# Patient Record
Sex: Female | Born: 1975 | Race: Black or African American | Hispanic: No | Marital: Married | Smoking: Never smoker
Health system: Southern US, Community
[De-identification: ages and names within clinical notes are randomized; demographics above are authoritative.]

## PROBLEM LIST (undated history)

## (undated) ENCOUNTER — Inpatient Hospital Stay (HOSPITAL_COMMUNITY): Payer: Self-pay

## (undated) DIAGNOSIS — R519 Headache, unspecified: Secondary | ICD-10-CM

## (undated) DIAGNOSIS — G43909 Migraine, unspecified, not intractable, without status migrainosus: Secondary | ICD-10-CM

## (undated) DIAGNOSIS — R011 Cardiac murmur, unspecified: Secondary | ICD-10-CM

## (undated) DIAGNOSIS — L509 Urticaria, unspecified: Secondary | ICD-10-CM

## (undated) DIAGNOSIS — S060XAA Concussion with loss of consciousness status unknown, initial encounter: Secondary | ICD-10-CM

## (undated) DIAGNOSIS — K219 Gastro-esophageal reflux disease without esophagitis: Secondary | ICD-10-CM

## (undated) DIAGNOSIS — I10 Essential (primary) hypertension: Secondary | ICD-10-CM

## (undated) DIAGNOSIS — N3010 Interstitial cystitis (chronic) without hematuria: Secondary | ICD-10-CM

## (undated) DIAGNOSIS — Z973 Presence of spectacles and contact lenses: Secondary | ICD-10-CM

## (undated) DIAGNOSIS — B009 Herpesviral infection, unspecified: Secondary | ICD-10-CM

## (undated) DIAGNOSIS — T7840XA Allergy, unspecified, initial encounter: Secondary | ICD-10-CM

## (undated) DIAGNOSIS — K589 Irritable bowel syndrome without diarrhea: Secondary | ICD-10-CM

## (undated) DIAGNOSIS — T8859XA Other complications of anesthesia, initial encounter: Secondary | ICD-10-CM

## (undated) DIAGNOSIS — T783XXA Angioneurotic edema, initial encounter: Secondary | ICD-10-CM

## (undated) DIAGNOSIS — S060X9A Concussion with loss of consciousness of unspecified duration, initial encounter: Secondary | ICD-10-CM

## (undated) DIAGNOSIS — E785 Hyperlipidemia, unspecified: Secondary | ICD-10-CM

## (undated) DIAGNOSIS — G8929 Other chronic pain: Secondary | ICD-10-CM

## (undated) DIAGNOSIS — O139 Gestational [pregnancy-induced] hypertension without significant proteinuria, unspecified trimester: Secondary | ICD-10-CM

## (undated) DIAGNOSIS — Z8489 Family history of other specified conditions: Secondary | ICD-10-CM

## (undated) DIAGNOSIS — K635 Polyp of colon: Secondary | ICD-10-CM

## (undated) DIAGNOSIS — D649 Anemia, unspecified: Secondary | ICD-10-CM

## (undated) HISTORY — DX: Polyp of colon: K63.5

## (undated) HISTORY — DX: Gestational (pregnancy-induced) hypertension without significant proteinuria, unspecified trimester: O13.9

## (undated) HISTORY — PX: ADENOIDECTOMY: SUR15

## (undated) HISTORY — DX: Herpesviral infection, unspecified: B00.9

## (undated) HISTORY — DX: Allergy, unspecified, initial encounter: T78.40XA

## (undated) HISTORY — PX: KNEE ARTHROSCOPY: SHX127

## (undated) HISTORY — DX: Headache, unspecified: R51.9

## (undated) HISTORY — DX: Essential (primary) hypertension: I10

## (undated) HISTORY — DX: Hyperlipidemia, unspecified: E78.5

## (undated) HISTORY — DX: Cardiac murmur, unspecified: R01.1

## (undated) HISTORY — DX: Interstitial cystitis (chronic) without hematuria: N30.10

## (undated) HISTORY — PX: TONSILLECTOMY: SUR1361

## (undated) HISTORY — DX: Angioneurotic edema, initial encounter: T78.3XXA

## (undated) HISTORY — DX: Urticaria, unspecified: L50.9

## (undated) HISTORY — DX: Gilbert syndrome: E80.4

## (undated) HISTORY — PX: SHOULDER ARTHROSCOPY: SHX128

## (undated) HISTORY — DX: Other chronic pain: G89.29

## (undated) HISTORY — PX: WISDOM TOOTH EXTRACTION: SHX21

---

## 2000-04-07 ENCOUNTER — Other Ambulatory Visit: Admission: RE | Admit: 2000-04-07 | Discharge: 2000-04-07 | Payer: Self-pay | Admitting: Gynecology

## 2000-05-28 HISTORY — PX: BREAST SURGERY: SHX581

## 2000-06-10 ENCOUNTER — Encounter (INDEPENDENT_AMBULATORY_CARE_PROVIDER_SITE_OTHER): Payer: Self-pay | Admitting: *Deleted

## 2000-06-10 ENCOUNTER — Ambulatory Visit (HOSPITAL_BASED_OUTPATIENT_CLINIC_OR_DEPARTMENT_OTHER): Admission: RE | Admit: 2000-06-10 | Discharge: 2000-06-10 | Payer: Self-pay | Admitting: Surgery

## 2001-06-09 ENCOUNTER — Other Ambulatory Visit: Admission: RE | Admit: 2001-06-09 | Discharge: 2001-06-09 | Payer: Self-pay | Admitting: Obstetrics and Gynecology

## 2001-06-24 ENCOUNTER — Encounter: Admission: RE | Admit: 2001-06-24 | Discharge: 2001-06-24 | Payer: Self-pay | Admitting: Internal Medicine

## 2001-06-24 ENCOUNTER — Encounter: Payer: Self-pay | Admitting: Internal Medicine

## 2002-02-02 ENCOUNTER — Emergency Department (HOSPITAL_COMMUNITY): Admission: EM | Admit: 2002-02-02 | Discharge: 2002-02-02 | Payer: Self-pay | Admitting: *Deleted

## 2002-08-03 ENCOUNTER — Other Ambulatory Visit: Admission: RE | Admit: 2002-08-03 | Discharge: 2002-08-03 | Payer: Self-pay | Admitting: Obstetrics and Gynecology

## 2004-01-24 ENCOUNTER — Other Ambulatory Visit: Admission: RE | Admit: 2004-01-24 | Discharge: 2004-01-24 | Payer: Self-pay | Admitting: Obstetrics and Gynecology

## 2005-03-16 ENCOUNTER — Other Ambulatory Visit: Admission: RE | Admit: 2005-03-16 | Discharge: 2005-03-16 | Payer: Self-pay | Admitting: Obstetrics and Gynecology

## 2005-05-15 ENCOUNTER — Other Ambulatory Visit: Admission: RE | Admit: 2005-05-15 | Discharge: 2005-05-15 | Payer: Self-pay | Admitting: Obstetrics and Gynecology

## 2006-05-28 HISTORY — PX: WISDOM TOOTH EXTRACTION: SHX21

## 2006-06-04 ENCOUNTER — Other Ambulatory Visit: Admission: RE | Admit: 2006-06-04 | Discharge: 2006-06-04 | Payer: Self-pay | Admitting: Obstetrics & Gynecology

## 2007-04-17 ENCOUNTER — Ambulatory Visit (HOSPITAL_BASED_OUTPATIENT_CLINIC_OR_DEPARTMENT_OTHER): Admission: RE | Admit: 2007-04-17 | Discharge: 2007-04-17 | Payer: Self-pay | Admitting: Urology

## 2007-05-28 ENCOUNTER — Other Ambulatory Visit: Admission: RE | Admit: 2007-05-28 | Discharge: 2007-05-28 | Payer: Self-pay | Admitting: Obstetrics and Gynecology

## 2007-10-13 ENCOUNTER — Other Ambulatory Visit: Admission: RE | Admit: 2007-10-13 | Discharge: 2007-10-13 | Payer: Self-pay | Admitting: Obstetrics and Gynecology

## 2008-05-26 ENCOUNTER — Other Ambulatory Visit: Admission: RE | Admit: 2008-05-26 | Discharge: 2008-05-26 | Payer: Self-pay | Admitting: Obstetrics and Gynecology

## 2008-05-28 HISTORY — PX: SHOULDER ARTHROSCOPY: SHX128

## 2009-04-13 ENCOUNTER — Emergency Department (HOSPITAL_COMMUNITY): Admission: EM | Admit: 2009-04-13 | Discharge: 2009-04-13 | Payer: Self-pay | Admitting: Emergency Medicine

## 2009-04-18 ENCOUNTER — Emergency Department (HOSPITAL_COMMUNITY): Admission: EM | Admit: 2009-04-18 | Discharge: 2009-04-18 | Payer: Self-pay | Admitting: Emergency Medicine

## 2010-10-10 NOTE — Op Note (Signed)
NAME:  Susi, [facility]                ACCOUNT NO.:  [id number]   MEDICAL RECORD NO.:  [id number]          PATIENT TYPE:  AMB   LOCATION:  [facility]                         FACILITY:  [facility]   PHYSICIAN:  Martina Sinner, MD DATE OF BIRTH:  10-09-1975   DATE OF PROCEDURE:  04/17/2007  DATE OF DISCHARGE:                               OPERATIVE REPORT   PREOPERATIVE DIAGNOSIS:  Pelvic pain.   POSTOPERATIVE DIAGNOSIS:  Pelvic pain, interstitial cystitis, mild  meatal stenosis.   PROCEDURE:  Cystoscopy, urethral dilation, bladder hydrodistention,  bladder installation therapy.   Shyann Hefner has left lower quadrant and left flank pain.  By index, the  suspicion was moderate to low that she had interstitial cystitis.   The patient was prepped and draped in the usual fashion.  She was given  preoperative antibiotics.  Even with the obturator, I could not insert a  21 [place] scope because of mild meatal stenosis.  She was, therefore,  dilated to 78 [place].  I then could cystoscope the patient.  Initial  examination demonstrated normal bladder mucosa with no stitch, foreign  body, or carcinoma.  I hydrodistended twice to 550 mL.  She had moderate  glomerulations along the sidewalls and near the trigone.  She had a  little bit of bladder bleeding.  There were no ulcers.  From the  dilation, she had a little bit of bleeding near the urethral meatus that  was more of a nuisance.  I held direct pressure.  For this reason, I put  in an 47 French catheter and a rolled up back with Estrace cream to give  direct pressure.  This should stop quickly postoperatively.   My working diagnosis is interstitial cystitis.  I will have the patient  come back next week for education and treatment.           ______________________________  Martina Sinner, MD  Electronically Signed     SAM/MEDQ  D:  04/17/2007  T:  04/17/2007  Job:  [id number]

## 2010-10-13 NOTE — Op Note (Signed)
[facility]  Patient:    MONCERRAT, BURNSTEIN                         MRN: [id number] Proc. Date: 06/10/00 Adm. Date:  [id number] Disc. Date: [id number] Attending:  Tonye Royalty                           Operative Report  PREOPERATIVE INDICATIONS:  A 35 year old young lady with a palpable mass in her right breast.  POSTOPERATIVE DIAGNOSIS:  Probable fibroadenoma.  PROCEDURE:  Right breast biopsy.  SURGEON:  Thornton Park. Daphine Deutscher, M.D.  ANESTHESIA:  MAC.  DESCRIPTION OF PROCEDURE:  Ms. Lynita Lombard was taken to OR 2 on June 10, 2000 and the breast was prepped Betadine and draped sterilely.  She was given some sedation.  Area in question was infiltrated with 1% lidocaine and a small curvilinear incision was made from about the 3:30 position to the 5 oclock position.  This was carried down into some fairly dense breast tissue and sample was removed and then going deeper, the mass could be palpated and was then grasped and dissected free from surrounding tissue and removed.  It was about a grape-sized mass and was firm.  This was sent for permanent sections. Wound was irrigated and bleeding was controlled with the electrocautery.  I had put some little marking stitches where I had made a radial mark to appose the edges and with that as a guide, I placed some 4-0 Vicryl subcutaneously and subcuticularly and then tied those down and then closed the rest of the wound with benzoin and Steri-Strips.  The patient tolerated this procedure well and was taken to the recovery room in satisfactory condition. DD:  06/10/00 TD:  06/10/00 Job: [id number]

## 2010-10-25 ENCOUNTER — Ambulatory Visit (HOSPITAL_BASED_OUTPATIENT_CLINIC_OR_DEPARTMENT_OTHER): Admission: RE | Admit: 2010-10-25 | Payer: 59 | Source: Ambulatory Visit | Admitting: Orthopedic Surgery

## 2010-12-15 ENCOUNTER — Other Ambulatory Visit: Payer: Self-pay | Admitting: Surgery

## 2011-03-06 LAB — POCT HEMOGLOBIN-HEMACUE: Operator id: 28088

## 2011-03-06 LAB — POCT PREGNANCY, URINE
Operator id: 280881
Preg Test, Ur: NEGATIVE

## 2011-03-29 ENCOUNTER — Inpatient Hospital Stay (INDEPENDENT_AMBULATORY_CARE_PROVIDER_SITE_OTHER)
Admission: RE | Admit: 2011-03-29 | Discharge: 2011-03-29 | Disposition: A | Discharge status: Home / Self Care | Payer: 59 | Source: Ambulatory Visit | Attending: Family Medicine | Admitting: Family Medicine

## 2011-03-29 DIAGNOSIS — J069 Acute upper respiratory infection, unspecified: Secondary | ICD-10-CM

## 2012-01-13 ENCOUNTER — Emergency Department (INDEPENDENT_AMBULATORY_CARE_PROVIDER_SITE_OTHER)
Admission: EM | Admit: 2012-01-13 | Discharge: 2012-01-13 | Disposition: A | Discharge status: Home / Self Care | Payer: 59 | Source: Home / Self Care | Attending: Emergency Medicine | Admitting: Emergency Medicine

## 2012-01-13 DIAGNOSIS — H109 Unspecified conjunctivitis: Secondary | ICD-10-CM

## 2012-01-13 MED ORDER — TETRACAINE HCL 0.5 % OP SOLN
OPHTHALMIC | Status: AC
  Filled 2012-01-13: qty 2

## 2012-01-13 MED ORDER — POLYETHYL GLYCOL-PROPYL GLYCOL 0.4-0.3 % OP SOLN: 1.0000 [drp] | OPHTHALMIC | Status: DC

## 2012-01-13 NOTE — ED Provider Notes (Signed)
No chief complaint on file.   History of Present Illness:   Denise [name] is a 36 year old female with a two-day history of redness, itching, and crusting of her right eye. Her vision has been normal. She has had no swelling of the lids. She denies any drainage from the eye. There is no injury to the eye. She does wear contact lenses, but has taken them out and is now wearing her spectacles. She denies any URI symptoms such as sore throat, nasal congestion, rhinorrhea, fever, cough, or adenopathy. She has not been exposed to anyone with eye infections.  Review of Systems:  Other than noted above, the patient denies any of the following symptoms: Systemic:  No fever, chills, sweats, fatigue, or weight loss. Eye:  No redness, eye pain, photophobia, discharge, blurred vision, or diplopia. ENT:  No nasal congestion, rhinorrhea, or sore throat. Lymphatic:  No adenopathy. Skin:  No rash or pruritis.  PMFSH:  Past medical history, family history, social history, meds, and allergies were reviewed.  Physical Exam:   Vital signs:  BP 128/73  Pulse 80  Temp 98.5 F (36.9 C) (Oral)  Resp 18  SpO2 98% General:  Alert and in no distress. Eye:  Lids and periorbital tissues are normal. Conjunctiva of the right is mildly injected, left eye appears normal. There is no foreign body is seen in the conjunctival sac. The upper lid was everted and appears to be normal. The cornea is intact to fluorescein staining. Anterior chambers normal. Funduscopic exam is normal. PERRLA, full EOMs. ENT:  TMs and canals clear.  Nasal mucosa normal.  No intra-oral lesions, mucous membranes moist, pharynx clear. Neck:  No adenopathy tenderness or mass. Skin:  Clear, warm and dry.  Assessment:  The encounter diagnosis was Conjunctivitis. Probably viral conjunctivitis.  Plan:   1.  The following meds were prescribed:   New Prescriptions   POLYETHYL GLYCOL-PROPYL GLYCOL (SYSTANE) 0.4-0.3 % SOLN    Apply 1 drop to eye every 3 (three)  hours while awake.   2.  The patient was instructed in symptomatic care and handouts were given. 3.  The patient was told to return if becoming worse in any way, if no better in 3 or 4 days, and given some red flag symptoms that would indicate earlier return.   Reuben Likes, MD 01/13/12 [phone]

## 2012-01-26 DIAGNOSIS — R109 Unspecified abdominal pain: Secondary | ICD-10-CM | POA: Insufficient documentation

## 2012-01-26 DIAGNOSIS — Z79899 Other long term (current) drug therapy: Secondary | ICD-10-CM | POA: Insufficient documentation

## 2012-01-27 ENCOUNTER — Encounter (HOSPITAL_COMMUNITY): Payer: Self-pay | Admitting: *Deleted

## 2012-01-27 ENCOUNTER — Emergency Department (HOSPITAL_COMMUNITY): Payer: 59

## 2012-01-27 ENCOUNTER — Emergency Department (HOSPITAL_COMMUNITY)
Admission: EM | Admit: 2012-01-27 | Discharge: 2012-01-27 | Disposition: A | Discharge status: Home / Self Care | Payer: 59 | Attending: Emergency Medicine | Admitting: Emergency Medicine

## 2012-01-27 DIAGNOSIS — R109 Unspecified abdominal pain: Secondary | ICD-10-CM

## 2012-01-27 HISTORY — DX: Irritable bowel syndrome, unspecified: K58.9

## 2012-01-27 HISTORY — DX: Migraine, unspecified, not intractable, without status migrainosus: G43.909

## 2012-01-27 LAB — URINALYSIS, ROUTINE W REFLEX MICROSCOPIC
Bilirubin Urine: NEGATIVE
Glucose, UA: NEGATIVE mg/dL
Hgb urine dipstick: NEGATIVE
Specific Gravity, Urine: 1.012 (ref 1.005–1.030)
Urobilinogen, UA: 1 mg/dL (ref 0.0–1.0)

## 2012-01-27 LAB — CBC WITH DIFFERENTIAL/PLATELET
Eosinophils Absolute: 0.1 10*3/uL (ref 0.0–0.7)
Eosinophils Relative: 1 % (ref 0–5)
HCT: 39 % (ref 36.0–46.0)
Lymphocytes Relative: 48 % — ABNORMAL HIGH (ref 12–46)
Lymphs Abs: 3.1 10*3/uL (ref 0.7–4.0)
MCH: 29.9 pg (ref 26.0–34.0)
MCV: 82.6 fL (ref 78.0–100.0)
Monocytes Absolute: 0.6 10*3/uL (ref 0.1–1.0)
Monocytes Relative: 10 % (ref 3–12)
Platelets: 231 10*3/uL (ref 150–400)
RBC: 4.72 MIL/uL (ref 3.87–5.11)
WBC: 6.4 10*3/uL (ref 4.0–10.5)

## 2012-01-27 LAB — COMPREHENSIVE METABOLIC PANEL
ALT: 16 U/L (ref 0–35)
BUN: 13 mg/dL (ref 6–23)
CO2: 22 mEq/L (ref 19–32)
Calcium: 9.6 mg/dL (ref 8.4–10.5)
Creatinine, Ser: 0.97 mg/dL (ref 0.50–1.10)
GFR calc Af Amer: 87 mL/min — ABNORMAL LOW (ref >90)
GFR calc non Af Amer: 75 mL/min — ABNORMAL LOW (ref >90)
Glucose, Bld: 100 mg/dL — ABNORMAL HIGH (ref 70–99)
Total Protein: 7.3 g/dL (ref 6.0–8.3)

## 2012-01-27 LAB — POCT PREGNANCY, URINE: Preg Test, Ur: NEGATIVE

## 2012-01-27 MED ORDER — IOHEXOL 300 MG/ML  SOLN
100.0000 mL | Freq: Once | INTRAMUSCULAR | Status: AC | PRN
  Administered 2012-01-27: 100 mL via INTRAVENOUS

## 2012-01-27 MED ORDER — OXYCODONE-ACETAMINOPHEN 5-325 MG PO TABS
2.0000 | ORAL_TABLET | Freq: Once | ORAL | Status: AC
  Administered 2012-01-27: 2 via ORAL
  Filled 2012-01-27: qty 2

## 2012-01-27 MED ORDER — TRAMADOL HCL 50 MG PO TABS: 50.0000 mg | ORAL_TABLET | Freq: Four times a day (QID) | ORAL | Status: AC | PRN

## 2012-01-27 NOTE — ED Notes (Signed)
Pt has IBS and was seen at gasterologist and GYN over the last month when the pain started. Pt states that the pain is only on her left side and the pain wraps around to her back. Pt was given increased strength of pain medication and for 4 weeks has been taking it but it has been helping. Pt denies problems with urine

## 2012-01-27 NOTE — ED Notes (Signed)
EDP at bedside  

## 2012-01-27 NOTE — ED Notes (Signed)
Pt complaining of right flank pain. Pt states that she is still having pain. Pt resting at this time.

## 2012-01-27 NOTE — ED Provider Notes (Signed)
History     CSN: [id number]  Arrival date & time 01/26/12  2358   First MD Initiated Contact with Patient 01/27/12 [phone]      Chief Complaint  Patient presents with  . Flank Pain    (Consider location/radiation/quality/duration/timing/severity/associated sxs/prior treatment) HPI  This patient is a very pleasant 36 year old woman with a history of irritable bowel syndrome. She presents with complaints of intermittent left flank pain which has been ongoing for the past 3-4 weeks. She has been evaluated by both her gynecologist and her gastroenterologist for this pain. Her gastroenterologist increased the dose of medication which she has been taking for treatment of her IBS. However, the patient says she has been on this increased dose for the past 2 weeks and has not noticed improvement. Her pain has not really progressed. She is mainly concerned and presents to the emergency department because her pain has not improved.  The patient denies history of similar symptoms. She has not had any genitourinary symptoms. She has not had fever, nausea, vomiting. She has intermittent diarrhea which is chronic. She denies bloody stools. Her pain is worse with certain movements of the torso. It is nonradiating. The pain initially was noticed in the left lower quadrant a couple of weeks ago. However, it now localizes to the left flank region.    Past Medical History  Diagnosis Date  . IBS (irritable bowel syndrome)   . Herpes   . Migraine     Past Surgical History  Procedure Date  . Shoulder arthroscopy     History reviewed. No pertinent family history.  History  Substance Use Topics  . Smoking status: Never Smoker   . Smokeless tobacco: Not on file  . Alcohol Use: Yes    OB History    Grav Para Term Preterm Abortions TAB SAB Ect Mult Living                  Review of Systems  Gen: no weight loss, fevers, chills, night sweats Eyes: no discharge or drainage, no occular pain or  visual changes Nose: no epistaxis or rhinorrhea Mouth: no dental pain, no sore throat Neck: no neck pain Lungs: no SOB, cough, wheezing CV: no chest pain, palpitations, dependent edema or orthopnea Abd: As per history of present illness, otherwise negative GU: no dysuria or gross hematuria MSK: As per history of present illness, otherwise negative Neuro: no headache, no focal neurologic deficits Skin: no rash Psyche: negative.  Allergies  Review of patient's allergies indicates no known allergies.  Home Medications   Current Outpatient Rx  Name Route Sig Dispense Refill  . CAMBIA PO Oral Take 50 mg by mouth daily as needed. For migraine    . LINACLOTIDE 290 MCG PO CAPS Oral Take 1 capsule by mouth daily.    . ADULT MULTIVITAMIN W/MINERALS CH Oral Take 1 tablet by mouth daily.    Janetta Hora ESTRADIOL 0.5-35 MG-MCG PO TABS Oral Take 1 tablet by mouth daily.    . TOPIRAMATE 200 MG PO TABS Oral Take 200 mg by mouth at bedtime.    Marland Kitchen VALACYCLOVIR HCL 1 G PO TABS Oral Take 1,000 mg by mouth daily.      BP 128/90  Pulse 72  Temp 98.2 F (36.8 C) (Oral)  Resp 18  SpO2 100%  Physical Exam  Gen: well developed and well nourished appearing Head: NCAT Eyes: PERL, EOMI Nose: no epistaixis or rhinorrhea Mouth/throat: mucosa is moist and pink Neck: supple, no stridor Lungs:  CTA B, no wheezing, rhonchi or rales Abd: soft, notender, nondistended Back: no midline ttp, no cva ttp, ttp over the left flank region with reproduction/excacerbation of pain. Skin: no rashese, wnl Neuro: CN ii-xii grossly intact, no focal deficits Psyche; normal affect,  calm and cooperative.   ED Course  Procedures (including critical care time)  Results for orders placed during the hospital encounter of 01/27/12 (from the past 24 hour(s))  CBC WITH DIFFERENTIAL     Status: Abnormal   Collection Time   01/27/12 12:07 AM      Component Value Range   WBC 6.4  4.0 - 10.5 K/uL   RBC 4.72  3.87 -  5.11 MIL/uL   Hemoglobin 14.1  12.0 - 15.0 g/dL   HCT 16.1  09.6 - 04.5 %   MCV 82.6  78.0 - 100.0 fL   MCH 29.9  26.0 - 34.0 pg   MCHC 36.2 (*) 30.0 - 36.0 g/dL   RDW 40.9  81.1 - 91.4 %   Platelets 231  150 - 400 K/uL   Neutrophils Relative 40 (*) 43 - 77 %   Neutro Abs 2.6  1.7 - 7.7 K/uL   Lymphocytes Relative 48 (*) 12 - 46 %   Lymphs Abs 3.1  0.7 - 4.0 K/uL   Monocytes Relative 10  3 - 12 %   Monocytes Absolute 0.6  0.1 - 1.0 K/uL   Eosinophils Relative 1  0 - 5 %   Eosinophils Absolute 0.1  0.0 - 0.7 K/uL   Basophils Relative 0  0 - 1 %   Basophils Absolute 0.0  0.0 - 0.1 K/uL  COMPREHENSIVE METABOLIC PANEL     Status: Abnormal   Collection Time   01/27/12 12:07 AM      Component Value Range   Sodium 139  135 - 145 mEq/L   Potassium 3.7  3.5 - 5.1 mEq/L   Chloride 106  96 - 112 mEq/L   CO2 22  19 - 32 mEq/L   Glucose, Bld 100 (*) 70 - 99 mg/dL   BUN 13  6 - 23 mg/dL   Creatinine, Ser 7.82  0.50 - 1.10 mg/dL   Calcium 9.6  8.4 - 95.6 mg/dL   Total Protein 7.3  6.0 - 8.3 g/dL   Albumin 3.9  3.5 - 5.2 g/dL   AST 22  0 - 37 U/L   ALT 16  0 - 35 U/L   Alkaline Phosphatase 62  39 - 117 U/L   Total Bilirubin 0.7  0.3 - 1.2 mg/dL   GFR calc non Af Amer 75 (*) >90 mL/min   GFR calc Af Amer 87 (*) >90 mL/min  URINALYSIS, ROUTINE W REFLEX MICROSCOPIC     Status: Abnormal   Collection Time   01/27/12 12:12 AM      Component Value Range   Color, Urine YELLOW  YELLOW   APPearance CLOUDY (*) CLEAR   Specific Gravity, Urine 1.012  1.005 - 1.030   pH 7.0  5.0 - 8.0   Glucose, UA NEGATIVE  NEGATIVE mg/dL   Hgb urine dipstick NEGATIVE  NEGATIVE   Bilirubin Urine NEGATIVE  NEGATIVE   Ketones, ur NEGATIVE  NEGATIVE mg/dL   Protein, ur NEGATIVE  NEGATIVE mg/dL   Urobilinogen, UA 1.0  0.0 - 1.0 mg/dL   Nitrite NEGATIVE  NEGATIVE   Leukocytes, UA NEGATIVE  NEGATIVE  POCT PREGNANCY, URINE     Status: Normal   Collection Time   01/27/12  12:56 AM      Component Value Range   Preg  Test, Ur NEGATIVE  NEGATIVE    CT abd/pelvis: no acute intra-abdominal pathology identified by Radiologist per wet read of IV contrasted CT.     MDM  ED work up is non-diagnostic. The patient seems to most likely have myofascial pain in light of reproducibility and ttp over the left low back very laterally.  Patient had pelvic exam and pelvic labs by her GYN for eval of the same sx just a couple of weeks ago. She is in a monogomous relationship and I do not believe another pelvic exam is indicated. The patient is stable for d/c with plan for symptomatic management and follow up with her gastroenterologist and also her PCP. She is advised to return to the ED for any urgent health concerns.         Brandt Loosen, MD 01/27/12 [phone]

## 2012-01-27 NOTE — ED Notes (Signed)
Dr. Lavella Lemons was made aware that the patient is requesting for pain medication.

## 2012-08-03 ENCOUNTER — Encounter (HOSPITAL_COMMUNITY): Payer: Self-pay | Admitting: Emergency Medicine

## 2012-08-03 ENCOUNTER — Emergency Department (HOSPITAL_COMMUNITY)
Admission: EM | Admit: 2012-08-03 | Discharge: 2012-08-03 | Disposition: A | Discharge status: Home / Self Care | Payer: 59 | Source: Home / Self Care

## 2012-08-03 DIAGNOSIS — J069 Acute upper respiratory infection, unspecified: Secondary | ICD-10-CM

## 2012-08-03 DIAGNOSIS — R0982 Postnasal drip: Secondary | ICD-10-CM

## 2012-08-03 DIAGNOSIS — J329 Chronic sinusitis, unspecified: Secondary | ICD-10-CM

## 2012-08-03 HISTORY — DX: Gastro-esophageal reflux disease without esophagitis: K21.9

## 2012-08-03 MED ORDER — ALBUTEROL SULFATE HFA 108 (90 BASE) MCG/ACT IN AERS
1.0000 | INHALATION_SPRAY | Freq: Four times a day (QID) | RESPIRATORY_TRACT | Status: DC | PRN
Start: 2012-08-03 — End: 2012-10-14

## 2012-08-03 MED ORDER — PHENYLEPHRINE-CHLORPHEN-DM 10-4-12.5 MG/5ML PO LIQD: 5.0000 mL | ORAL | Status: DC | PRN

## 2012-08-03 NOTE — ED Notes (Signed)
Onset last Sunday of cough, now burning cough, pressure in head and sinus.  No noted fever, felt hot a few times

## 2012-08-03 NOTE — ED Provider Notes (Signed)
History     CSN: [id number]  Arrival date & time 08/03/12  1101   None     Chief Complaint  Patient presents with  . Cough    (Consider location/radiation/quality/duration/timing/severity/associated sxs/prior treatment) HPI Comments: 37 year old female complaining of a cough for 5 days. She is complaining of upper respiratory congestion, head pressure, nasal congestion and PND. She denies fever, chills or respiratory difficulty. No GI or GU symptoms. She is currently taking Mucinex D. and NyQuil date.   Past Medical History  Diagnosis Date  . IBS (irritable bowel syndrome)   . Herpes   . Migraine   . GERD (gastroesophageal reflux disease)     Past Surgical History  Procedure Laterality Date  . Shoulder arthroscopy      No family history on file.  History  Substance Use Topics  . Smoking status: Never Smoker   . Smokeless tobacco: Not on file  . Alcohol Use: Yes    OB History   Grav Para Term Preterm Abortions TAB SAB Ect Mult Living                  Review of Systems  Constitutional: Negative for fever, chills, activity change, appetite change and fatigue.  HENT: Positive for congestion, sore throat, rhinorrhea and postnasal drip. Negative for facial swelling, trouble swallowing, neck pain and neck stiffness.   Eyes: Negative.   Respiratory: Negative.   Cardiovascular: Negative.   Gastrointestinal: Negative.   Musculoskeletal: Negative.   Skin: Negative for pallor and rash.  Neurological: Negative.   Psychiatric/Behavioral: Negative.     Allergies  Review of patient's allergies indicates no known allergies.  Home Medications   Current Outpatient Rx  Name  Route  Sig  Dispense  Refill  . Belladonna Alk-Phenobarbital (DONNATAL PO)   Oral   Take by mouth.         . pantoprazole (PROTONIX) 40 MG tablet   Oral   Take 40 mg by mouth daily.         Marland Kitchen albuterol (PROVENTIL HFA;VENTOLIN HFA) 108 (90 BASE) MCG/ACT inhaler   Inhalation   Inhale 1-2  puffs into the lungs every 6 (six) hours as needed for wheezing.   1 Inhaler   0   . Diclofenac Potassium (CAMBIA PO)   Oral   Take 50 mg by mouth daily as needed. For migraine         . Linaclotide (LINZESS) 290 MCG CAPS   Oral   Take 1 capsule by mouth daily.         . Multiple Vitamin (MULTIVITAMIN WITH MINERALS) TABS   Oral   Take 1 tablet by mouth daily.         . norethindrone-ethinyl estradiol (NECON,BREVICON,MODICON) 0.5-35 MG-MCG tablet   Oral   Take 1 tablet by mouth daily.         Marland Kitchen Phenylephrine-Chlorphen-DM 03-01-11.5 MG/5ML LIQD   Oral   Take 5 mLs by mouth every 4 (four) hours as needed.   120 mL   0   . topiramate (TOPAMAX) 200 MG tablet   Oral   Take 200 mg by mouth at bedtime.         . valACYclovir (VALTREX) 1000 MG tablet   Oral   Take 1,000 mg by mouth daily.           BP 136/86  Pulse 84  Temp(Src) 98.4 F (36.9 C) (Oral)  Resp 19  SpO2 98%  LMP 07/29/2012  Physical Exam  Nursing  note and vitals reviewed. Constitutional: She is oriented to person, place, and time. She appears well-developed and well-nourished. No distress.  HENT:  Bilateral TMs are normal. Oropharynx history daily but without exudates or swelling. Positive for clear PND  Neck: Normal range of motion. Neck supple.  Cardiovascular: Normal rate and regular rhythm.   Pulmonary/Chest: Effort normal and breath sounds normal. No respiratory distress. She has no rales.  Distant, faint and expiratory wheeze particularly with forced cough.  Abdominal: Soft.  Musculoskeletal: Normal range of motion. She exhibits no edema.  Lymphadenopathy:    She has no cervical adenopathy.  Neurological: She is alert and oriented to person, place, and time.  Skin: Skin is warm and dry. No rash noted.  Psychiatric: She has a normal mood and affect.    ED Course  Procedures (including critical care time)  Labs Reviewed - No data to display No results found.   1. URI (upper  respiratory infection)   2. PND (post-nasal drip)   3. Sinusitis       MDM  Norell CS 1 teaspoon every 4 hours when necessary cough and congestion. Cepacol lozenges as needed. Albuterol HFA 2 puffs every 4-6 hours when necessary cough Drink plenty of fluids stay well hydrated         Hayden Rasmussen, NP 08/03/12 1142

## 2012-09-01 ENCOUNTER — Encounter: Payer: Self-pay | Admitting: Nurse Practitioner

## 2012-09-01 ENCOUNTER — Telehealth: Payer: Self-pay | Admitting: Nurse Practitioner

## 2012-09-01 ENCOUNTER — Ambulatory Visit (INDEPENDENT_AMBULATORY_CARE_PROVIDER_SITE_OTHER): Payer: 59 | Admitting: Nurse Practitioner

## 2012-09-01 VITALS — BP 110/76 | HR 80 | Resp 18 | Ht 63.75 in | Wt 153.0 lb

## 2012-09-01 DIAGNOSIS — N390 Urinary tract infection, site not specified: Secondary | ICD-10-CM

## 2012-09-01 DIAGNOSIS — B373 Candidiasis of vulva and vagina: Secondary | ICD-10-CM

## 2012-09-01 DIAGNOSIS — A6000 Herpesviral infection of urogenital system, unspecified: Secondary | ICD-10-CM

## 2012-09-01 LAB — POCT URINALYSIS DIPSTICK
Glucose, UA: NEGATIVE
Nitrite, UA: NEGATIVE
Protein, UA: NEGATIVE

## 2012-09-01 MED ORDER — NITROFURANTOIN MONOHYD MACRO 100 MG PO CAPS: 100.0000 mg | ORAL_CAPSULE | Freq: Two times a day (BID) | ORAL | Status: DC

## 2012-09-01 MED ORDER — FLUCONAZOLE 150 MG PO TABS: 150.0000 mg | ORAL_TABLET | Freq: Once | ORAL | Status: DC

## 2012-09-01 MED ORDER — VALACYCLOVIR HCL 500 MG PO TABS: 500.0000 mg | ORAL_TABLET | Freq: Two times a day (BID) | ORAL | Status: DC

## 2012-09-01 NOTE — Patient Instructions (Addendum)
Monilial Vaginitis Vaginitis in a soreness, swelling and redness (inflammation) of the vagina and vulva. Monilial vaginitis is not a sexually transmitted infection. CAUSES  Yeast vaginitis is caused by yeast (candida) that is normally found in your vagina. With a yeast infection, the candida has overgrown in number to a point that upsets the chemical balance. SYMPTOMS   White, thick vaginal discharge.  Swelling, itching, redness and irritation of the vagina and possibly the lips of the vagina (vulva).  Burning or painful urination.  Painful intercourse. DIAGNOSIS  Things that may contribute to monilial vaginitis are:  Postmenopausal and virginal states.  Pregnancy.  Infections.  Being tired, sick or stressed, especially if you had monilial vaginitis in the past.  Diabetes. Good control will help lower the chance.  Birth control pills.  Tight fitting garments.  Using bubble bath, feminine sprays, douches or deodorant tampons.  Taking certain medications that kill germs (antibiotics).  Sporadic recurrence can occur if you become ill. TREATMENT  Your caregiver will give you medication.  There are several kinds of anti monilial vaginal creams and suppositories specific for monilial vaginitis. For recurrent yeast infections, use a suppository or cream in the vagina 2 times a week, or as directed.  Anti-monilial or steroid cream for the itching or irritation of the vulva may also be used. Get your caregiver's permission.  Painting the vagina with methylene blue solution may help if the monilial cream does not work.  Eating yogurt may help prevent monilial vaginitis. HOME CARE INSTRUCTIONS   Finish all medication as prescribed.  Do not have sex until treatment is completed or after your caregiver tells you it is okay.  Take warm sitz baths.  Do not douche.  Do not use tampons, especially scented ones.  Wear cotton underwear.  Avoid tight pants and panty  hose.  Tell your sexual partner that you have a yeast infection. They should go to their caregiver if they have symptoms such as mild rash or itching.  Your sexual partner should be treated as well if your infection is difficult to eliminate.  Practice safer sex. Use condoms.  Some vaginal medications cause latex condoms to fail. Vaginal medications that harm condoms are:  Cleocin cream.  Butoconazole (Femstat).  Terconazole (Terazol) vaginal suppository.  Miconazole (Monistat) (may be purchased over the counter). SEEK MEDICAL CARE IF:   You have a temperature by mouth above 102 F (38.9 C).  The infection is getting worse after 2 days of treatment.  The infection is not getting better after 3 days of treatment.  You develop blisters in or around your vagina.  You develop vaginal bleeding, and it is not your menstrual period.  You have pain when you urinate.  You develop intestinal problems.  You have pain with sexual intercourse. Document Released: 02/21/2005 Document Revised: 08/06/2011 Document Reviewed: 11/05/2008 Exit[employer] Patient Information 2013 [employer].  

## 2012-09-01 NOTE — Progress Notes (Deleted)
37 y.o.Single{Race/ethnicity:17218} female G0P0 with a {NUMBERS 1-20:19198} {gen duration:315003} history of the following: sympstoms started on Friday.  Worse on Saturday. Vagina itching tenderness, swelling Still had spotting to light flow. {symptoms; vaginitis:30830} Sexually active: {yes no:314532} Last sexual activity:{NUMBERS 1-20:19198}days ago. Pt also reports the following associated symptoms: {Sx; associated vaginitis:30832} Patient {HAS HAS NOT:18834}tried over the counter treatment with {Relief:12621} relief. Still on Topamax at 200 mg. Same dose for 11/2011.Maybe secondary to satrting pill a day late. Some increase in frequency. Ha takien OTC AZo for symptom relief.  No change in partner.     Exam:  Ext:{EXAM; GYN QIONG:29528}                Vag:{Findings; vagina (ob1):14593}                Cx:  {exam; gyn cervix:30847}                Uterus:{exam; uterus:14489}                Adnexa: {exam; adnexa:12223}  Wet Prep shows:{Findings; GYN salin prep:60700}   Dx:{vaginitis type:315262}   Tx:{treatments; vaginitis:14231}

## 2012-09-01 NOTE — Telephone Encounter (Signed)
PT HAS A YEAST INFECTION PT HAS TRIED OVER THE COUNTER MEDS TO TREAT IT BUT NOW WANTS AN APPOINTMENT

## 2012-09-01 NOTE — Telephone Encounter (Signed)
LMTCB  aa 

## 2012-09-01 NOTE — Progress Notes (Signed)
Subjective:     Patient ID: Denise [name], female   DOB: 11/15/1975, 37 y.o.   MRN: [id number]  HPI Comments: Patent states she has noted vaginal irritation for about 2 - 3  Days. Suspected irritation on Saturday, then by Sunday very raw feeling, swollen and vaginal discharge.  She used I day Monistat but symptoms became worse.  She thought she may have a flare of HSV so went up in dose of Valtrex to 1 gm.    Today very itching and raw. Noted some urinary frequency and maybe urgency with some dysuria.   Unsure if this is related to vaginitis.    Review of Systems  Constitutional: Negative for fever.  Respiratory: Negative.   Cardiovascular: Negative.   Gastrointestinal: Negative.   Genitourinary: Positive for dysuria, urgency, frequency, vaginal discharge, genital sores and dyspareunia.  Musculoskeletal: Negative for myalgias.  Psychiatric/Behavioral: Negative.        Objective:   Physical Exam  Constitutional: She appears well-developed and well-nourished.  Cardiovascular: Normal rate.   Pulmonary/Chest: Effort normal.  Abdominal: Soft. She exhibits no distension and no mass. There is no tenderness. There is no rebound and no guarding.  Genitourinary:  Vaginal discharge is white and thick, most likely related to Monistat.  Smear is taken from sidewall. No HSV lesions seen.  Neurological: She is alert.  Skin: Skin is warm and dry.  Psychiatric: She has a normal mood and affect. Her behavior is normal. Thought content normal.      UA  + leukocytes and very cloudy Wet prep + koh for yeast, saline - clue, ph 4.5 Assessment:     Yeast Vaginitis R/O UTI History of HSV    Plan:     Diflucan 150 mg today and repeat in 3 days Also pt. Request a new RX for Valtrex 500 mg so she does not have to cut in half.  Macrobid 100 mg bid # 14 and will call with urine results

## 2012-09-02 LAB — URINALYSIS, MICROSCOPIC ONLY
Casts: NONE SEEN
Crystals: NONE SEEN

## 2012-09-03 LAB — URINE CULTURE: Colony Count: 25000

## 2012-09-09 NOTE — Progress Notes (Signed)
Encounter reviewed by Dr. Caroly Purewal Silva.  

## 2012-10-14 ENCOUNTER — Encounter: Payer: Self-pay | Admitting: Obstetrics & Gynecology

## 2012-10-14 ENCOUNTER — Ambulatory Visit (INDEPENDENT_AMBULATORY_CARE_PROVIDER_SITE_OTHER): Payer: 59 | Admitting: Obstetrics & Gynecology

## 2012-10-14 VITALS — BP 122/78 | HR 64 | Resp 16

## 2012-10-14 DIAGNOSIS — N898 Other specified noninflammatory disorders of vagina: Secondary | ICD-10-CM

## 2012-10-14 DIAGNOSIS — R6882 Decreased libido: Secondary | ICD-10-CM

## 2012-10-14 DIAGNOSIS — IMO0002 Reserved for concepts with insufficient information to code with codable children: Secondary | ICD-10-CM

## 2012-10-14 LAB — COMPREHENSIVE METABOLIC PANEL
Alkaline Phosphatase: 50 U/L (ref 39–117)
CO2: 20 mEq/L (ref 19–32)
Creat: 0.92 mg/dL (ref 0.50–1.10)
Glucose, Bld: 89 mg/dL (ref 70–99)
Total Bilirubin: 1 mg/dL (ref 0.3–1.2)

## 2012-10-14 LAB — ESTRADIOL: Estradiol: 19.5 pg/mL

## 2012-10-14 LAB — TSH: TSH: 1.836 u[IU]/mL (ref 0.350–4.500)

## 2012-10-14 MED ORDER — FLUCONAZOLE 150 MG PO TABS: 150.0000 mg | ORAL_TABLET | Freq: Once | ORAL | Status: DC

## 2012-10-14 MED ORDER — ESTRADIOL 0.1 MG/GM VA CREA: TOPICAL_CREAM | VAGINAL | Status: DC

## 2012-10-14 NOTE — Patient Instructions (Signed)
We will call with lab results   

## 2012-10-14 NOTE — Progress Notes (Signed)
37 y.o.SingleAfrican American female G0P0 with a 6 month(s) history of the following:pelvic pain and decreased sex drive. Pain is most significant with penetration.  Feels like she does not have enough lubrication.  After each episode of intercourse she does have discharge.  She feels like this is causing her to have decreased drive and they have intercourse only once or twice a month.  Discharge does not have any odor.  Treated for yeast infection about two months ago.  Sexually active: yes.  Together with significant other for 9 years.    Pt also reports the following associated symptoms: urinary urgency.  Patient has not tried over the counter treatment.    Had full STD testing 1/14.  All was negative.  Done with Shirlyn Goltz.  Exam:  ZOX:WRUEAV                WUJ:WJXBJYNWG: white and odorless                Cx:  normal appearance                Uterus:non-tender, normal shape and consistency, retroverted                Adnexa: normal adnexa and no mass, fullness, tenderness  Wet Prep shows:ph 4.5, KOH and saline showed no trich, no WBCs, +yeast, no clue cells   NF:AOZHYQM vaginitis Dyspareunia Decreased libido  VH:QION antifungal see orders. Topical estrace 1gm pv twice weekly and externally qhs.  Samples given. FHS, estradiol, CMP, TSH today

## 2012-10-22 ENCOUNTER — Telehealth: Payer: Self-pay | Admitting: Obstetrics & Gynecology

## 2012-10-22 NOTE — Telephone Encounter (Signed)
Spoke with pt about lab results. Pt also mentioned how she is doing with recent yeast infection. Pt took diflucan and repeated 48 hrs later, which was yesterday. Still "a little tender down there" but pt just started period. Instructed pt to call back if still having any symptoms of yeast in a few days. Pt agreeable.

## 2012-10-22 NOTE — Telephone Encounter (Signed)
Patient called to obtain her lab results. Patient stated that they were available in Mychart, but she didn't understand some of them. Patient wants someone to call her and go over them with her. T.Allen

## 2012-10-24 ENCOUNTER — Other Ambulatory Visit: Payer: Self-pay | Admitting: Family Medicine

## 2012-10-24 DIAGNOSIS — E049 Nontoxic goiter, unspecified: Secondary | ICD-10-CM

## 2012-10-27 ENCOUNTER — Other Ambulatory Visit: Payer: 59

## 2012-11-04 ENCOUNTER — Ambulatory Visit
Admission: RE | Admit: 2012-11-04 | Discharge: 2012-11-04 | Disposition: A | Discharge status: Home / Self Care | Payer: 59 | Source: Ambulatory Visit | Attending: Family Medicine | Admitting: Family Medicine

## 2012-11-04 DIAGNOSIS — E049 Nontoxic goiter, unspecified: Secondary | ICD-10-CM

## 2012-11-20 NOTE — Telephone Encounter (Signed)
Patient thinks she has symptoms of I.C. and has no more medication. Patient is asking if she can have a prescription or does she need to see a Urologist?

## 2012-11-20 NOTE — Telephone Encounter (Signed)
Spoke with pt who is having urinary frequency, urinating small amts, pelvic pain, and dyspareunia. Pt reports she was diagnosed in 2007 with IC by urologist, but hasn't seen urologist "in years." Scheduled OV with SM tomorrow at 12:45.

## 2012-11-21 ENCOUNTER — Encounter: Payer: Self-pay | Admitting: Obstetrics & Gynecology

## 2012-11-21 ENCOUNTER — Ambulatory Visit (INDEPENDENT_AMBULATORY_CARE_PROVIDER_SITE_OTHER): Payer: 59 | Admitting: Obstetrics & Gynecology

## 2012-11-21 VITALS — BP 110/60 | HR 74 | Resp 14 | Ht 63.25 in | Wt 153.8 lb

## 2012-11-21 DIAGNOSIS — R3915 Urgency of urination: Secondary | ICD-10-CM

## 2012-11-21 DIAGNOSIS — N951 Menopausal and female climacteric states: Secondary | ICD-10-CM

## 2012-11-21 DIAGNOSIS — Z0184 Encounter for antibody response examination: Secondary | ICD-10-CM

## 2012-11-21 DIAGNOSIS — IMO0002 Reserved for concepts with insufficient information to code with codable children: Secondary | ICD-10-CM

## 2012-11-21 DIAGNOSIS — N9489 Other specified conditions associated with female genital organs and menstrual cycle: Secondary | ICD-10-CM

## 2012-11-21 LAB — FOLLICLE STIMULATING HORMONE: FSH: 18.7 m[IU]/mL

## 2012-11-21 LAB — POCT URINALYSIS DIPSTICK
Leukocytes, UA: NEGATIVE
Spec Grav, UA: 1.02

## 2012-11-21 NOTE — Patient Instructions (Signed)
Start PNV.  Look at dosage and if not getting , then change to OTC prenatal vitamin.

## 2012-11-21 NOTE — Progress Notes (Signed)
Subjective:     Patient ID: Denise [name], female   DOB: 02-Apr-1976, 37 y.o.   MRN: [id number]  Pelvic Pain The patient's primary symptoms include pelvic pain. The patient's pertinent negatives include no vaginal discharge. Associated symptoms include dysuria and urgency. Pertinent negatives include no flank pain.   [name] here for discussion of pain with intercourse and also with change in quality of urination--feels like she "sprays" instead of having a stream.  She is also experiencing more urgency.  No discharge.  Also having pain with insertion of tampon.  Sometimes has deep pain as well, low in pelvis.  Having some mucousy discharge as well.  Does have normal cycles, lasting four or five days.  She will have dark discharge for a few more days.  Used vaginal estrogen cream and this seemed to help a little.  Saw Denise [name] Monday years ago.  Ready for referral back.   Review of Systems  Constitutional: Negative.   HENT: Negative.   Eyes: Negative.   Respiratory: Negative.   Cardiovascular: Negative.   Endocrine: Negative.   Genitourinary: Positive for dysuria, urgency, vaginal pain, menstrual problem, pelvic pain and dyspareunia. Negative for flank pain and vaginal discharge. Difficulty urinating: feels like she has to double void.  Musculoskeletal: Negative.   Skin: Negative.   Allergic/Immunologic: Negative.   Neurological: Negative.   Hematological: Negative.   Psychiatric/Behavioral: Negative.       Objective:   Physical Exam  Constitutional: She appears well-developed and well-nourished.  HENT:  Head: Normocephalic and atraumatic.  Abdominal: Soft. Bowel sounds are normal. Hernia confirmed negative in the right inguinal area and confirmed negative in the left inguinal area.  Genitourinary: Vagina normal and uterus normal. There is no rash or tenderness on the right labia. There is no rash or tenderness on the left labia. Deviated: tenderness to palpation of pelvic floor.  Cervix exhibits no motion tenderness and no discharge. Right adnexum displays no mass and no tenderness. Left adnexum displays no mass and no tenderness.       Assessment:     Pelvic pain Dysparuenia Vaginal dryness Starting to try for pregnancy H/O IC     Plan:     FSH, pelvic ultrasound to r/o gyn pathology Urine culture Rubella status Pt to start PNV Referral back to Denise [name] Monday

## 2012-11-22 LAB — RUBELLA SCREEN: Rubella: 4.63 Index — ABNORMAL HIGH (ref <0.90)

## 2012-11-23 LAB — URINE CULTURE: Colony Count: NO GROWTH

## 2012-11-26 ENCOUNTER — Telehealth: Payer: Self-pay | Admitting: Obstetrics & Gynecology

## 2012-11-26 NOTE — Telephone Encounter (Signed)
Patient called for results °

## 2012-11-27 ENCOUNTER — Other Ambulatory Visit: Payer: Self-pay | Admitting: Obstetrics & Gynecology

## 2012-11-27 DIAGNOSIS — N939 Abnormal uterine and vaginal bleeding, unspecified: Secondary | ICD-10-CM

## 2012-11-27 NOTE — Telephone Encounter (Signed)
Dr Hyacinth Meeker, this patient is calling for her results from 6/27. They are in the systems, but I do not see where you have seen these. Please advise.

## 2012-11-27 NOTE — Progress Notes (Signed)
Patient coming for [facility] on Monday at 11:40am

## 2012-11-27 NOTE — Telephone Encounter (Signed)
Spoke with pt.  She is going to return for [facility] on Monday.  Just off of cycle.  Doesn't want to wait until next cycle for day 3 FSH.

## 2012-12-01 ENCOUNTER — Ambulatory Visit (INDEPENDENT_AMBULATORY_CARE_PROVIDER_SITE_OTHER): Payer: 59 | Admitting: Obstetrics & Gynecology

## 2012-12-01 DIAGNOSIS — N939 Abnormal uterine and vaginal bleeding, unspecified: Secondary | ICD-10-CM

## 2012-12-01 DIAGNOSIS — N926 Irregular menstruation, unspecified: Secondary | ICD-10-CM

## 2012-12-04 LAB — ANTI MULLERIAN HORMONE: AMH AssessR: 0.31 ng/mL

## 2012-12-05 ENCOUNTER — Encounter: Payer: Self-pay | Admitting: Obstetrics & Gynecology

## 2012-12-05 MED ORDER — ESTRADIOL 0.5 MG PO TABS: 0.5000 mg | ORAL_TABLET | Freq: Every day | ORAL | Status: DC

## 2012-12-11 ENCOUNTER — Telehealth: Payer: Self-pay | Admitting: Orthopedic Surgery

## 2012-12-11 NOTE — Telephone Encounter (Signed)
Spoke with pt about appt with Dr. Sherron Monday at [facility] 01-02-13 at 9 am. Pt received info packet in the mail and is aware of appt.

## 2012-12-15 ENCOUNTER — Other Ambulatory Visit (HOSPITAL_COMMUNITY): Payer: Self-pay | Admitting: Obstetrics and Gynecology

## 2012-12-15 DIAGNOSIS — IMO0002 Reserved for concepts with insufficient information to code with codable children: Secondary | ICD-10-CM

## 2012-12-16 ENCOUNTER — Ambulatory Visit (INDEPENDENT_AMBULATORY_CARE_PROVIDER_SITE_OTHER): Payer: 59 | Admitting: Obstetrics & Gynecology

## 2012-12-16 ENCOUNTER — Ambulatory Visit (INDEPENDENT_AMBULATORY_CARE_PROVIDER_SITE_OTHER): Payer: 59

## 2012-12-16 DIAGNOSIS — D252 Subserosal leiomyoma of uterus: Secondary | ICD-10-CM

## 2012-12-16 DIAGNOSIS — D259 Leiomyoma of uterus, unspecified: Secondary | ICD-10-CM

## 2012-12-16 DIAGNOSIS — IMO0002 Reserved for concepts with insufficient information to code with codable children: Secondary | ICD-10-CM

## 2012-12-16 DIAGNOSIS — N8300 Follicular cyst of ovary, unspecified side: Secondary | ICD-10-CM

## 2012-12-16 NOTE — Progress Notes (Signed)
37 y.o.Singlefemale G0 here for a pelvic ultrasound.  She is actively undergoing evaluation with Dr. April Manson for elevated AMH.  She reports just having a day 3 FSH done.  She is also having another test on Friday.  She is unsure of exactly what is being done.  She reports he is concerned she may have endometriosis due to pain she is experiencing.  I still think interstitial cystitis is a consideration.  She has an appointment with Dr. Sherron Monday in early August.  I have really encouraged her to keep this appointment.    Patient's last menstrual period was 12/13/2012.  Sexually active:  yes  Contraception: no method  FINDINGS: UTERUS:  6.6 x 4.0 x 2.7cm with 20 x 13 left subserosal fibroid EMS: 3.84mm ADNEXA:   Left ovary 2.6 x 1.5 x 1.5cm with 11mm follicle   Right ovary 2.0 x 1.5 x 2.0cm CUL DE SAC: neg for free fluid  Assessment:  2.0cm subserosal fibroid, no evidence on ultrasound of endometrioma/endometriosis, appropriate appearing ovaries for given age Plan: Pt has follow-up already scheduled with Dr. April Manson and will be seeing urology in about 2 weeks.  All questions answered.  ~15 minutes spent with patient >50% of time was in face to face discussion of above.

## 2012-12-17 ENCOUNTER — Encounter: Payer: Self-pay | Admitting: Obstetrics & Gynecology

## 2012-12-17 NOTE — Patient Instructions (Signed)
Please call if you have any worsening of symptoms 

## 2012-12-19 ENCOUNTER — Encounter (HOSPITAL_COMMUNITY): Payer: Self-pay

## 2012-12-19 ENCOUNTER — Ambulatory Visit (HOSPITAL_COMMUNITY)
Admission: RE | Admit: 2012-12-19 | Discharge: 2012-12-19 | Disposition: A | Discharge status: Home / Self Care | Payer: 59 | Source: Ambulatory Visit | Attending: Obstetrics and Gynecology | Admitting: Obstetrics and Gynecology

## 2012-12-19 DIAGNOSIS — N979 Female infertility, unspecified: Secondary | ICD-10-CM | POA: Insufficient documentation

## 2012-12-19 DIAGNOSIS — IMO0002 Reserved for concepts with insufficient information to code with codable children: Secondary | ICD-10-CM

## 2012-12-19 MED ORDER — IOHEXOL 300 MG/ML  SOLN
10.0000 mL | Freq: Once | INTRAMUSCULAR | Status: AC | PRN
  Administered 2012-12-19: 10 mL

## 2013-01-15 ENCOUNTER — Telehealth: Payer: Self-pay | Admitting: Obstetrics & Gynecology

## 2013-01-15 NOTE — Telephone Encounter (Signed)
Patient seeing Dr. April Manson for elevated AMH, was scheduled for a CT scan today was told after doing a UPT the results were positive.Patient is very happy but not sure what to do next, she is concerned about some  Medication  She's taking for IBS and migraines. I told her the first step is to contact Dr. Lyndal Rainbow office To let them know and to see what the next step is, and tell them about her concerns with continuing her current Medication for IBS and migraines. Pt called me back to say that Dr. Lyndal Rainbow office is ordering some lab Work and she will keep her scheduled appt with there office. I told her this was good and that I would be forwarding This message to Dr. Hyacinth Meeker. Was this OK?

## 2013-01-15 NOTE — Telephone Encounter (Signed)
Patient went in for a CT Scan that Doctor Macdiarmid.had ordered . Patient was given pregnant test. It came back positive. She needs an appointment to verify pregnancy and to get an ob referral. Dr.Miller has not opening until 28th. Or as soon Asap.

## 2013-01-16 NOTE — Telephone Encounter (Signed)
Spoke to patient.  Advised to stop Topamax, Protonix, Linzess.  She has already spoken to headache doctor.  Another medication was given for headaches if she needs.  Was told this was "safe".  She cannot remember what it was.  She is on her PNV.  Advised can take OTC Zantac if needed.  Advised Valtrex was ok to take.  Will come here for viability scan and will schedule through his office.

## 2013-01-16 NOTE — Telephone Encounter (Signed)
Patient is calling back to ask if she is ok to take Valtrex. She is having a breakout?

## 2013-01-19 ENCOUNTER — Telehealth: Payer: Self-pay | Admitting: Obstetrics & Gynecology

## 2013-01-19 DIAGNOSIS — O3680X1 Pregnancy with inconclusive fetal viability, fetus 1: Secondary | ICD-10-CM

## 2013-01-19 NOTE — Telephone Encounter (Signed)
Frazier Butt @ Dr. Clarisa Schools office would like to schedule an ultrasound for our mutual patient of Dr. Rondel Baton. Victorino Dike is asking about sending an order.

## 2013-01-20 NOTE — Telephone Encounter (Signed)
Spoke with Denise [name] at Dr. Lyndal Rainbow office. Pt is pregnant, and is a mutual pt of Dr. Hyacinth Meeker and April Manson. They would typically do an ultrasound at 5 weeks, 5 days gestation, but they are out of network for pt's insurance. Denise [name] is wondering if we can do the PUS here to save pt money. She will fax requisition for PUS to our office. OK to do PUS here?

## 2013-01-20 NOTE — Telephone Encounter (Signed)
Spoke to Keyes at Dr April Manson and notified that we can do PUS this week on 01-22-13. Ok to call patient directly.  Patient agreeable to 01-22-13 at 1pm.

## 2013-01-20 NOTE — Telephone Encounter (Signed)
Yes.  Please schedule or give to [facility].

## 2013-01-22 ENCOUNTER — Ambulatory Visit (INDEPENDENT_AMBULATORY_CARE_PROVIDER_SITE_OTHER): Payer: 59 | Admitting: Obstetrics & Gynecology

## 2013-01-22 ENCOUNTER — Ambulatory Visit (INDEPENDENT_AMBULATORY_CARE_PROVIDER_SITE_OTHER): Payer: 59

## 2013-01-22 VITALS — BP 120/78 | Ht 63.5 in | Wt 150.4 lb

## 2013-01-22 DIAGNOSIS — O3680X0 Pregnancy with inconclusive fetal viability, not applicable or unspecified: Secondary | ICD-10-CM

## 2013-01-22 DIAGNOSIS — D252 Subserosal leiomyoma of uterus: Secondary | ICD-10-CM

## 2013-01-22 DIAGNOSIS — R110 Nausea: Secondary | ICD-10-CM

## 2013-01-22 DIAGNOSIS — O3680X1 Pregnancy with inconclusive fetal viability, fetus 1: Secondary | ICD-10-CM

## 2013-01-22 DIAGNOSIS — N912 Amenorrhea, unspecified: Secondary | ICD-10-CM

## 2013-01-22 LAB — US OB TRANSVAGINAL

## 2013-01-22 MED ORDER — PROMETHAZINE HCL 25 MG PO TABS: 25.0000 mg | ORAL_TABLET | Freq: Four times a day (QID) | ORAL | Status: DC | PRN

## 2013-01-22 MED ORDER — RANITIDINE HCL 150 MG PO TABS: 150.0000 mg | ORAL_TABLET | Freq: Two times a day (BID) | ORAL | Status: DC

## 2013-01-22 NOTE — Patient Instructions (Addendum)
We will schedule ultrasound for two weeks.  Please call if you have any bleeding.

## 2013-01-22 NOTE — Progress Notes (Signed)
37 y.o.Singlefemale here for a pelvic ultrasound for viability.  Patient's LMP was 12/13/12.  She is unsure about conception.  She is having a lot of nausea.  Just trying to eat whatever she can.  No significant emesis.  She has also started having more GERD since stopped medications.  Needs suggestion.  Also having some cramping and low back pain on the left  Patient's last menstrual period was 12/13/2012.  FINDINGS: UTERUS: Gestational and yolk sac noted with early fetal pole seen.  CRL 0.19cm, out of range for dating.  12mm posterior subserosal fibroid noted. ADNEXA:   Left ovary with 1.4cm appearing corpus luteal cyst   Right ovary normal CUL DE SAC: no free fluid  Images/results reviewed with patient.  Feel everything looks normal but probably too early.  Signs/symptoms of pregnancy present.  Will repeat PUS 2 weeks.  Reassured patient about crmaping and low back pain due to corpus luteal finding.    Will start Zantac 150mg  BID for reflux.  She can also use tums.  Going on cruise at end of September.  Reassured this was ok.  Phenergen rx given for nausea.  D/W pt mercury in fish as she really likes fish.  No unpasteurized cheese, juices discussed.  She has had chicken pox and they have no cats.  Assessment:  Early singleton IUD, nausea, GERD, posterior fibroid Plan: Repeat PUS 2 weeks. Signs/symptoms of SAB discussed.  ~25 minutes spent with patient >50% of time was in face to face discussion of above.

## 2013-01-23 ENCOUNTER — Encounter: Payer: Self-pay | Admitting: Obstetrics & Gynecology

## 2013-02-03 ENCOUNTER — Ambulatory Visit (INDEPENDENT_AMBULATORY_CARE_PROVIDER_SITE_OTHER): Payer: 59

## 2013-02-03 ENCOUNTER — Ambulatory Visit (INDEPENDENT_AMBULATORY_CARE_PROVIDER_SITE_OTHER): Payer: 59 | Admitting: Obstetrics & Gynecology

## 2013-02-03 VITALS — BP 122/80 | Ht 63.5 in | Wt 151.8 lb

## 2013-02-03 DIAGNOSIS — N912 Amenorrhea, unspecified: Secondary | ICD-10-CM

## 2013-02-03 DIAGNOSIS — O3680X0 Pregnancy with inconclusive fetal viability, not applicable or unspecified: Secondary | ICD-10-CM

## 2013-02-03 DIAGNOSIS — D252 Subserosal leiomyoma of uterus: Secondary | ICD-10-CM

## 2013-02-03 DIAGNOSIS — O3680X1 Pregnancy with inconclusive fetal viability, fetus 1: Secondary | ICD-10-CM

## 2013-02-03 DIAGNOSIS — N83209 Unspecified ovarian cyst, unspecified side: Secondary | ICD-10-CM

## 2013-02-03 LAB — US OB TRANSVAGINAL

## 2013-02-03 NOTE — Progress Notes (Signed)
37 y.o.Singlefemale here for a pelvic ultrasound for viability.  U/S 2 weeks ago showed very early gestational and yolk sac with early fetal pole.  No FCA was noted.  Pt will having lots of nausea.  Coping, though.  Fatigue is terrible at end of day.  She knows this is all temporary.  Patient's last menstrual period was 12/13/2012.  Sexually active:  yes  Contraception: no method  FINDINGS: UTERUS:  Single IUD with +FCA at 146 BPM, CRL 1.14cm., 7 2/7 week by u/s.  <2cm subserosal fibroid. EMS: n/a ADNEXA:   Left ovary 4.0 x 2.5cm with persistent 1.8cm corpus luteal cyst   Right ovary 1.1 x 2.2cm CUL DE SAC: small free fluid in PCDS  Reviewed findings with pt.  They are going to transfer care to [facility].  On PNV.  Not smoking.  No ETOH.  No cats in house.  Exercising.  Has medication for nausea and GERD.  Assessment:  Viable singleton IUP, GERD, Subserosal fibroid, corpus luteal cyst, stable Plan: Transfer care to [facility].  ~15 minutes spent with patient >50% of time was in face to face discussion of above.

## 2013-02-03 NOTE — Patient Instructions (Addendum)
Please call if you have any problems/questions.

## 2013-02-04 ENCOUNTER — Encounter: Payer: Self-pay | Admitting: Obstetrics & Gynecology

## 2013-02-09 LAB — OB RESULTS CONSOLE ANTIBODY SCREEN: ANTIBODY SCREEN: NEGATIVE

## 2013-02-09 LAB — OB RESULTS CONSOLE RUBELLA ANTIBODY, IGM: RUBELLA: IMMUNE

## 2013-02-09 LAB — OB RESULTS CONSOLE ABO/RH: RH Type: POSITIVE

## 2013-02-09 LAB — OB RESULTS CONSOLE RPR: RPR: NONREACTIVE

## 2013-02-09 LAB — OB RESULTS CONSOLE HIV ANTIBODY (ROUTINE TESTING): HIV: NONREACTIVE

## 2013-02-09 LAB — OB RESULTS CONSOLE HEPATITIS B SURFACE ANTIGEN: Hepatitis B Surface Ag: NEGATIVE

## 2013-02-26 LAB — OB RESULTS CONSOLE GC/CHLAMYDIA
Chlamydia: NEGATIVE
GC PROBE AMP, GENITAL: NEGATIVE

## 2013-03-16 ENCOUNTER — Other Ambulatory Visit: Payer: Self-pay

## 2013-03-16 MED ORDER — RANITIDINE HCL 150 MG PO TABS: 150.0000 mg | ORAL_TABLET | Freq: Two times a day (BID) | ORAL | Status: DC

## 2013-03-16 NOTE — Telephone Encounter (Signed)
Patient was given rx for #60/2RF on 01/22/13. Please advise if this is ok to refill.

## 2013-04-02 ENCOUNTER — Other Ambulatory Visit: Payer: Self-pay

## 2013-06-15 ENCOUNTER — Ambulatory Visit: Payer: Self-pay | Admitting: Nurse Practitioner

## 2013-06-24 DIAGNOSIS — G43909 Migraine, unspecified, not intractable, without status migrainosus: Secondary | ICD-10-CM | POA: Insufficient documentation

## 2013-06-24 DIAGNOSIS — B009 Herpesviral infection, unspecified: Secondary | ICD-10-CM | POA: Insufficient documentation

## 2013-06-24 DIAGNOSIS — K589 Irritable bowel syndrome without diarrhea: Secondary | ICD-10-CM | POA: Insufficient documentation

## 2013-06-24 DIAGNOSIS — K219 Gastro-esophageal reflux disease without esophagitis: Secondary | ICD-10-CM | POA: Insufficient documentation

## 2013-06-24 DIAGNOSIS — N3010 Interstitial cystitis (chronic) without hematuria: Secondary | ICD-10-CM | POA: Insufficient documentation

## 2013-06-26 ENCOUNTER — Encounter: Payer: Self-pay | Admitting: Cardiovascular Disease

## 2013-06-26 ENCOUNTER — Ambulatory Visit (INDEPENDENT_AMBULATORY_CARE_PROVIDER_SITE_OTHER): Payer: 59 | Admitting: Cardiovascular Disease

## 2013-06-26 ENCOUNTER — Telehealth: Payer: Self-pay | Admitting: Cardiovascular Disease

## 2013-06-26 ENCOUNTER — Ambulatory Visit: Payer: 59 | Admitting: Cardiovascular Disease

## 2013-06-26 VITALS — BP 126/70 | HR 121 | Ht 63.0 in | Wt 178.4 lb

## 2013-06-26 DIAGNOSIS — R011 Cardiac murmur, unspecified: Secondary | ICD-10-CM

## 2013-06-26 DIAGNOSIS — G43909 Migraine, unspecified, not intractable, without status migrainosus: Secondary | ICD-10-CM

## 2013-06-26 DIAGNOSIS — R002 Palpitations: Secondary | ICD-10-CM

## 2013-06-26 NOTE — Assessment & Plan Note (Signed)
A bit louder than most flow murmurs  No Hb in chart TSH normal F/U echo to r/o bicuspid AV or PS.  Doubt she will have a hemodynamically significant lesion to alter pregnancy.

## 2013-06-26 NOTE — Assessment & Plan Note (Signed)
Resting HR somewhat high ECG otherwise normal Avoid caffeine and stress Echo to assess baseline RV/LV function Observe for now

## 2013-06-26 NOTE — Patient Instructions (Signed)
Your physician wants you to follow-up in: 3 MONTHS  WITH  DR NISHAN  You will receive a reminder letter in the mail two months in advance. If you don't receive a letter, please call our office to schedule the follow-up appointment. Your physician recommends that you continue on your current medications as directed. Please refer to the Current Medication list given to you today. Your physician has requested that you have an echocardiogram. Echocardiography is a painless test that uses sound waves to create images of your heart. It provides your doctor with information about the size and shape of your heart and how well your heart's chambers and valves are working. This procedure takes approximately one hour. There are no restrictions for this procedure.  

## 2013-06-26 NOTE — Telephone Encounter (Signed)
SPOKE  WITH  PT  IS  COMING IN AT  2:30 PM   INSTEAD  OF  4:15 PM .Denise [name]

## 2013-06-26 NOTE — Telephone Encounter (Signed)
New problem    Pt returning your call. Please call

## 2013-06-26 NOTE — Progress Notes (Signed)
Patient ID: Denise [name], female   DOB: 1976-04-22, 38 y.o.   MRN: [id number]   38 yo of Dr Garwin Brothers referred for murmur and palpitations.  She has no cardiac history.  Younger brother with murmur.  [redacted] weeks gestation with first pregnancy.  Normal weight gain TSH normal. History of benign palpitations before pregnancy No chest pain , dyspnea or syncope.  Denies ETOH, drugs, or smoking Having GTT next week no diabetes.  Having healthy baby boy "Denise [name]" so far  Husband seems supportive.  Palpitations can occur more frequently at night She sleeps on her side and position does not make a difference History of IBS and migraines Meds have been adjusted to be safe for pregnancy and she has not had too many     ROS: Denies fever, malais, weight loss, blurry vision, decreased visual acuity, cough, sputum, SOB, hemoptysis, pleuritic pain, palpitaitons, heartburn, abdominal pain, melena, lower extremity edema, claudication, or rash.  All other systems reviewed and negative   General: Affect appropriate Pregnant black female HEENT: normal Neck supple with no adenopathy JVP normal no bruits no thyromegaly Lungs clear with no wheezing and good diaphragmatic motion Heart:  S1/S2 SEM LLSB radiating to left neck and supraclavicular area  murmur,rub, gallop or click PMI normal Abdomen: benighn, BS positve, no tenderness, no AAA no bruit.  No HSM or HJR Distal pulses intact with no bruits No edema Neuro non-focal Skin warm and dry No muscular weakness  Medications Current Outpatient Prescriptions  Medication Sig Dispense Refill  . butalbital-acetaminophen-caffeine (FIORICET WITH CODEINE) 50-325-40-30 MG per capsule Take 1 capsule by mouth every 4 (four) hours as needed for headache.      Riley Nearing Vit-FePoly-Methylfol-FA (SELECT-OB) 29-0.6-0.4 MG CHEW Chew by mouth daily.      . ranitidine (ZANTAC) 150 MG tablet Take 1 tablet (150 mg total) by mouth 2 (two) times daily.  60 tablet  2  . valACYclovir  (VALTREX) 500 MG tablet Take 1 tablet (500 mg total) by mouth 2 (two) times daily.  180 tablet  3  . Diclofenac Potassium (CAMBIA PO) Take 50 mg by mouth daily as needed. For migraine      . estradiol (ESTRACE) 0.5 MG tablet Take 1 tablet (0.5 mg total) by mouth daily.  30 tablet  2  . Linaclotide (LINZESS) 290 MCG CAPS Take 1 capsule by mouth daily.      . Multiple Vitamin (MULTIVITAMIN WITH MINERALS) TABS Take 1 tablet by mouth daily.      . pantoprazole (PROTONIX) 40 MG tablet Take 40 mg by mouth daily.      . promethazine (PHENERGAN) 25 MG tablet Take 1 tablet (25 mg total) by mouth every 6 (six) hours as needed for nausea.  30 tablet  0  . topiramate (TOPAMAX) 200 MG tablet Take 200 mg by mouth at bedtime.       No current facility-administered medications for this visit.    Allergies Adhesive and Latex  Family History: Family History  Problem Relation Age of Onset  . Diabetes Mother   . Hyperlipidemia Mother   . Anemia Mother   . Thyroid disease Sister   . Diabetes Maternal Grandmother   . Diabetes Paternal Grandmother   . Renal Disease Paternal Grandfather     Social History: History   Social History  . Marital Status: Single    Spouse Name: N/A    Number of Children: N/A  . Years of Education: N/A   Occupational History  . Not on file.  Social History Main Topics  . Smoking status: Never Smoker   . Smokeless tobacco: Never Used  . Alcohol Use: Yes  . Drug Use: 1.00 per week  . Sexual Activity: Yes    Partners: Male    Birth Control/ Protection: OCP   Other Topics Concern  . Not on file   Social History Narrative  . No narrative on file    Electrocardiogram:  ST rate 121 voltage for LVH   Assessment and Plan

## 2013-06-26 NOTE — Assessment & Plan Note (Signed)
Stable with meds adjusted.

## 2013-07-16 ENCOUNTER — Ambulatory Visit (HOSPITAL_COMMUNITY): Payer: 59 | Attending: Internal Medicine | Admitting: Radiology

## 2013-07-16 ENCOUNTER — Other Ambulatory Visit: Payer: Self-pay

## 2013-07-16 ENCOUNTER — Encounter: Payer: Self-pay | Admitting: Internal Medicine

## 2013-07-16 DIAGNOSIS — R002 Palpitations: Secondary | ICD-10-CM

## 2013-07-16 DIAGNOSIS — R011 Cardiac murmur, unspecified: Secondary | ICD-10-CM | POA: Insufficient documentation

## 2013-07-16 NOTE — Progress Notes (Signed)
Echocardiogram performed.  

## 2013-07-29 ENCOUNTER — Inpatient Hospital Stay (HOSPITAL_COMMUNITY): Payer: 59

## 2013-07-29 ENCOUNTER — Inpatient Hospital Stay (HOSPITAL_COMMUNITY)
Admission: AD | Admit: 2013-07-29 | Discharge: 2013-07-29 | Disposition: A | Discharge status: Home / Self Care | Payer: 59 | Source: Ambulatory Visit | Attending: Obstetrics and Gynecology | Admitting: Obstetrics and Gynecology

## 2013-07-29 ENCOUNTER — Encounter (HOSPITAL_COMMUNITY): Payer: Self-pay | Admitting: *Deleted

## 2013-07-29 DIAGNOSIS — O139 Gestational [pregnancy-induced] hypertension without significant proteinuria, unspecified trimester: Secondary | ICD-10-CM

## 2013-07-29 DIAGNOSIS — IMO0002 Reserved for concepts with insufficient information to code with codable children: Secondary | ICD-10-CM | POA: Insufficient documentation

## 2013-07-29 DIAGNOSIS — O99891 Other specified diseases and conditions complicating pregnancy: Secondary | ICD-10-CM | POA: Insufficient documentation

## 2013-07-29 DIAGNOSIS — R030 Elevated blood-pressure reading, without diagnosis of hypertension: Secondary | ICD-10-CM | POA: Insufficient documentation

## 2013-07-29 DIAGNOSIS — O9989 Other specified diseases and conditions complicating pregnancy, childbirth and the puerperium: Principal | ICD-10-CM

## 2013-07-29 DIAGNOSIS — O26839 Pregnancy related renal disease, unspecified trimester: Secondary | ICD-10-CM | POA: Insufficient documentation

## 2013-07-29 LAB — COMPREHENSIVE METABOLIC PANEL
ALBUMIN: 2.9 g/dL — AB (ref 3.5–5.2)
ALT: 14 U/L (ref 0–35)
AST: 25 U/L (ref 0–37)
Alkaline Phosphatase: 179 U/L — ABNORMAL HIGH (ref 39–117)
BUN: 5 mg/dL — AB (ref 6–23)
CHLORIDE: 100 meq/L (ref 96–112)
CO2: 21 mEq/L (ref 19–32)
Calcium: 9.8 mg/dL (ref 8.4–10.5)
Creatinine, Ser: 0.66 mg/dL (ref 0.50–1.10)
GFR calc Af Amer: >90 mL/min (ref >90)
GFR calc non Af Amer: >90 mL/min (ref >90)
Glucose, Bld: 83 mg/dL (ref 70–99)
POTASSIUM: 4.1 meq/L (ref 3.7–5.3)
Sodium: 136 mEq/L — ABNORMAL LOW (ref 137–147)
TOTAL PROTEIN: 6.4 g/dL (ref 6.0–8.3)
Total Bilirubin: 0.6 mg/dL (ref 0.3–1.2)

## 2013-07-29 LAB — CBC
HEMATOCRIT: 29.7 % — AB (ref 36.0–46.0)
Hemoglobin: 10.8 g/dL — ABNORMAL LOW (ref 12.0–15.0)
MCH: 29.6 pg (ref 26.0–34.0)
MCHC: 36.4 g/dL — AB (ref 30.0–36.0)
MCV: 81.4 fL (ref 78.0–100.0)
Platelets: 207 10*3/uL (ref 150–400)
RBC: 3.65 MIL/uL — ABNORMAL LOW (ref 3.87–5.11)
RDW: 13.1 % (ref 11.5–15.5)
WBC: 9.4 10*3/uL (ref 4.0–10.5)

## 2013-07-29 LAB — URIC ACID: URIC ACID, SERUM: 6.3 mg/dL (ref 2.4–7.0)

## 2013-07-29 NOTE — MAU Note (Addendum)
Sent from office for [place] eval, BP elevated, + swelling and + protein in urine.  Denies HA, epigastric pain or visual changes.

## 2013-07-29 NOTE — Discharge Instructions (Signed)
Hypertension During Pregnancy °Hypertension is also called high blood pressure. Blood pressure moves blood in your body. Sometimes, the force that moves the blood becomes too strong. When you are pregnant, this condition should be watched carefully. It can cause problems for you and your baby. °HOME CARE  °· Make and keep all of your doctor visits. °· Take medicine as told by your doctor. Tell your doctor about all medicines you take. °· Eat very little salt. °· Exercise regularly. °· Do not drink alcohol. °· Do not smoke. °· Do not have drinks with caffeine. °· Lie on your left side when resting. °GET HELP RIGHT AWAY IF: °· You have bad belly (abdominal) pain. °· You have sudden puffiness (swelling) in the hands, ankles, or face. °· You gain 4 pounds (1.8 kilograms) or more in 1 week. °· You throw up (vomit) repeatedly. °· You have bleeding from the vagina. °· You do not feel the baby moving as much. °· You have a headache. °· You have blurred or double vision. °· You have muscle twitching or spasms. °· You have shortness of breath. °· You have blue fingernails and lips. °· You have blood in your pee (urine). °MAKE SURE YOU: °· Understand these instructions. °· Will watch your condition. °· Will get help right away if you are not doing well or get worse. °Document Released: 06/16/2010 Document Revised: 03/04/2013 Document Reviewed: 12/11/2012 °Exit[employer]® Patient Information ©2014 [employer]. ° °

## 2013-07-29 NOTE — MAU Provider Note (Signed)
History    Cc: elevated  BP, NR NST Chief Complaint  Patient presents with  . Hypertension   38 yo G1P0 BF @ 32 4/[redacted] weeks gestation seen in the office for routine care and was found to have BP 150/90. Pt denies h/a, visual changes or epigastric pain. Pt notes decreased FM. NST done was nonreactive. Exam showed FH 35 cm,. Extremity 1+ edema and trace protein on dipstick. Pt sent for labs and further evaluation  OB History   Grav Para Term Preterm Abortions TAB SAB Ect Mult Living   1               Past Medical History  Diagnosis Date  . IBS (irritable bowel syndrome)   . Migraine   . GERD (gastroesophageal reflux disease)   . HSV-2 infection     2008  . IC (interstitial cystitis)     2008    Past Surgical History  Procedure Laterality Date  . Shoulder arthroscopy    . Breast surgery  2002    right breast biopsy Benign  . Tonsillectomy    . Knee arthroscopy  3/11, 2012    right knee    Family History  Problem Relation Age of Onset  . Diabetes Mother   . Hyperlipidemia Mother   . Anemia Mother   . Thyroid disease Sister   . Diabetes Maternal Grandmother   . Diabetes Paternal Grandmother   . Renal Disease Paternal Grandfather     History  Substance Use Topics  . Smoking status: Never Smoker   . Smokeless tobacco: Never Used  . Alcohol Use: Yes    Allergies:  Allergies  Allergen Reactions  . Adhesive [Tape] Rash  . Latex Itching and Rash    Rash and itching from use of gloves    Prescriptions prior to admission  Medication Sig Dispense Refill  . guaiFENesin (MUCINEX) 600 MG 12 hr tablet Take 600 mg by mouth daily.      . Loratadine (CLARITIN PO) Take 1 tablet by mouth as needed (congestion, allergies).      . Prenatal Vit-Fe Fumarate-FA (PRENATAL MULTIVITAMIN) TABS tablet Take 1 tablet by mouth daily at 12 noon.      . ranitidine (ZANTAC) 150 MG tablet Take 1 tablet (150 mg total) by mouth 2 (two) times daily.  60 tablet  2  . valACYclovir (VALTREX) 500  MG tablet Take 500 mg by mouth daily. Twice daily for three days when breakout occurs         Physical Exam   Blood pressure 152/76, pulse 114, temperature 98.1 F (36.7 C), temperature source Oral, resp. rate 20, height 5\' 3"  (1.6 m), weight 84.823 kg (187 lb), last menstrual period 12/13/2012.  No exam performed today, done in office prior to arrival here.  CBC    Component Value Date/Time   WBC 9.4 07/29/2013 1318   RBC 3.65* 07/29/2013 1318   HGB 10.8* 07/29/2013 1318   HCT 29.7* 07/29/2013 1318   PLT 207 07/29/2013 1318   MCV 81.4 07/29/2013 1318   MCH 29.6 07/29/2013 1318   MCHC 36.4* 07/29/2013 1318   RDW 13.1 07/29/2013 1318   LYMPHSABS 3.1 01/27/2012 0007   MONOABS 0.6 01/27/2012 0007   EOSABS 0.1 01/27/2012 0007   BASOSABS 0.0 01/27/2012 0007    CMP     Component Value Date/Time   NA 136* 07/29/2013 1318   K 4.1 07/29/2013 1318   CL 100 07/29/2013 1318   CO2 21 07/29/2013 1318  GLUCOSE 83 07/29/2013 1318   BUN 5* 07/29/2013 1318   CREATININE 0.66 07/29/2013 1318   CREATININE 0.92 10/14/2012 1435   CALCIUM 9.8 07/29/2013 1318   PROT 6.4 07/29/2013 1318   ALBUMIN 2.9* 07/29/2013 1318   AST 25 07/29/2013 1318   ALT 14 07/29/2013 1318   ALKPHOS 179* 07/29/2013 1318   BILITOT 0.6 07/29/2013 1318   GFRNONAA >90 07/29/2013 1318   GFRAA >90 07/29/2013 1318   Uric acid : 6.3  Sono done: 5lb 3 oz, nl AFI  BPP 8/8 Tracing: baseline 135-140  (+) accels to 150   ED Course   A/P: gestational HTN Reassuring fetal testing IUP @ 32 4/7 weeks  P) d/c home. Preeclampsia warning signs. Defer oral antihypertensive agents at this time. Daily kick ct.  Weekly office visit  MDM   Marvene Staff, MD 5:21 PM 07/29/2013

## 2013-08-03 ENCOUNTER — Encounter (HOSPITAL_COMMUNITY): Payer: Self-pay | Admitting: *Deleted

## 2013-08-03 ENCOUNTER — Inpatient Hospital Stay (HOSPITAL_COMMUNITY)
Admission: AD | Admit: 2013-08-03 | Discharge: 2013-08-03 | Disposition: A | Discharge status: Home / Self Care | Payer: 59 | Source: Ambulatory Visit | Attending: Obstetrics and Gynecology | Admitting: Obstetrics and Gynecology

## 2013-08-03 DIAGNOSIS — K219 Gastro-esophageal reflux disease without esophagitis: Secondary | ICD-10-CM | POA: Insufficient documentation

## 2013-08-03 DIAGNOSIS — O99019 Anemia complicating pregnancy, unspecified trimester: Secondary | ICD-10-CM | POA: Diagnosis not present

## 2013-08-03 DIAGNOSIS — K589 Irritable bowel syndrome without diarrhea: Secondary | ICD-10-CM | POA: Insufficient documentation

## 2013-08-03 DIAGNOSIS — R51 Headache: Secondary | ICD-10-CM | POA: Insufficient documentation

## 2013-08-03 DIAGNOSIS — O139 Gestational [pregnancy-induced] hypertension without significant proteinuria, unspecified trimester: Secondary | ICD-10-CM | POA: Diagnosis present

## 2013-08-03 DIAGNOSIS — D649 Anemia, unspecified: Secondary | ICD-10-CM | POA: Insufficient documentation

## 2013-08-03 LAB — COMPREHENSIVE METABOLIC PANEL
ALT: 10 U/L (ref 0–35)
AST: 21 U/L (ref 0–37)
Albumin: 2.7 g/dL — ABNORMAL LOW (ref 3.5–5.2)
Alkaline Phosphatase: 189 U/L — ABNORMAL HIGH (ref 39–117)
BUN: 7 mg/dL (ref 6–23)
CO2: 22 meq/L (ref 19–32)
Calcium: 9.8 mg/dL (ref 8.4–10.5)
Chloride: 102 mEq/L (ref 96–112)
Creatinine, Ser: 0.69 mg/dL (ref 0.50–1.10)
GFR calc Af Amer: >90 mL/min (ref >90)
GFR calc non Af Amer: >90 mL/min (ref >90)
GLUCOSE: 105 mg/dL — AB (ref 70–99)
POTASSIUM: 4.1 meq/L (ref 3.7–5.3)
SODIUM: 136 meq/L — AB (ref 137–147)
TOTAL PROTEIN: 5.8 g/dL — AB (ref 6.0–8.3)
Total Bilirubin: 0.5 mg/dL (ref 0.3–1.2)

## 2013-08-03 LAB — URINALYSIS, ROUTINE W REFLEX MICROSCOPIC
Bilirubin Urine: NEGATIVE
GLUCOSE, UA: NEGATIVE mg/dL
Hgb urine dipstick: NEGATIVE
Ketones, ur: NEGATIVE mg/dL
Leukocytes, UA: NEGATIVE
Nitrite: NEGATIVE
Protein, ur: NEGATIVE mg/dL
SPECIFIC GRAVITY, URINE: 1.01 (ref 1.005–1.030)
UROBILINOGEN UA: 0.2 mg/dL (ref 0.0–1.0)
pH: 6 (ref 5.0–8.0)

## 2013-08-03 LAB — CBC
HEMATOCRIT: 28.7 % — AB (ref 36.0–46.0)
HEMOGLOBIN: 10.6 g/dL — AB (ref 12.0–15.0)
MCH: 30.3 pg (ref 26.0–34.0)
MCHC: 36.9 g/dL — AB (ref 30.0–36.0)
MCV: 82 fL (ref 78.0–100.0)
Platelets: 199 10*3/uL (ref 150–400)
RBC: 3.5 MIL/uL — ABNORMAL LOW (ref 3.87–5.11)
RDW: 13 % (ref 11.5–15.5)
WBC: 9 10*3/uL (ref 4.0–10.5)

## 2013-08-03 LAB — URIC ACID: Uric Acid, Serum: 6.7 mg/dL (ref 2.4–7.0)

## 2013-08-03 MED ORDER — LABETALOL HCL 200 MG PO TABS: 200.0000 mg | ORAL_TABLET | Freq: Two times a day (BID) | ORAL | Status: DC

## 2013-08-03 MED ORDER — ACETAMINOPHEN 500 MG PO TABS
1000.0000 mg | ORAL_TABLET | Freq: Once | ORAL | Status: AC
  Administered 2013-08-03: 1000 mg via ORAL
  Filled 2013-08-03: qty 2

## 2013-08-03 MED ORDER — LABETALOL HCL 100 MG PO TABS
200.0000 mg | ORAL_TABLET | Freq: Two times a day (BID) | ORAL | Status: DC
  Administered 2013-08-03: 200 mg via ORAL
  Filled 2013-08-03: qty 2

## 2013-08-03 NOTE — Discharge Instructions (Signed)
Hypertension During Pregnancy Hypertension is also called high blood pressure. It can occur at any time in life and during pregnancy. When you have hypertension, there is extra pressure inside your blood vessels that carry blood from the heart to the rest of your body (arteries). Hypertension during pregnancy can cause problems for you and your baby. Your baby might not weigh as much as it should at birth or might be born early (premature). Very bad cases of hypertension during pregnancy can be life threatening.  Different types of hypertension can occur during pregnancy.   Chronic hypertension. This happens when a woman has hypertension before pregnancy and it continues during pregnancy.  Gestational hypertension. This is when hypertension develops during pregnancy.  Preeclampsia or toxemia of pregnancy. This is a very serious type of hypertension that develops only during pregnancy. It is a disease that affects the whole body (systemic) and can be very dangerous for both mother and baby.  Gestational hypertension and preeclampsia usually go away after your baby is born. Blood pressure generally stabilizes within 6 weeks. Women who have hypertension during pregnancy have a greater chance of developing hypertension later in life or with future pregnancies. RISK FACTORS Some factors make you more likely to develop hypertension during pregnancy. Risk factors include:  Having hypertension before pregnancy.  Having hypertension during a previous pregnancy.  Being overweight.  Being older than 40.  Being pregnant with more than one baby (multiples).  Having diabetes or kidney problems. SIGNS AND SYMPTOMS Chronic and gestational hypertension rarely cause symptoms. Preeclampsia has symptoms, which may include:  Increased protein in your urine. Your health care provider will check for this at every prenatal visit.  Swelling of your hands and face.  Rapid weight gain.  Headaches.  Visual  changes.  Being bothered by light.  Abdominal pain, especially in the right upper area.  Chest pain.  Shortness of breath.  Increased reflexes.  Seizures. Seizures occur with a more severe form of preeclampsia, called eclampsia. DIAGNOSIS   You may be diagnosed with hypertension during a regular prenatal exam. At each visit, tests may include:  Blood pressure checks.  A urine test to check for protein in your urine.  The type of hypertension you are diagnosed with depends on when you developed it. It also depends on your specific blood pressure reading.  Developing hypertension before 20 weeks of pregnancy is consistent with chronic hypertension.  Developing hypertension after 20 weeks of pregnancy is consistent with gestational hypertension.  Hypertension with increased urinary protein is diagnosed as preeclampsia.  Blood pressure measurements that stay above 160 systolic or 110 diastolic are a sign of severe preeclampsia. TREATMENT Treatment for hypertension during pregnancy varies. Treatment depends on the type of hypertension and how serious it is.  If you take medicine for chronic hypertension, you may need to switch medicines.  Drugs called ACE inhibitors should not be taken during pregnancy.  Low-dose aspirin may be suggested for women who have risk factors for preeclampsia.  If you have gestational hypertension, you may need to take a blood pressure medicine that is safe during pregnancy. Your health care provider will recommend the appropriate medicine.  If you have severe preeclampsia, you may need to be in the hospital. Health care providers will watch you and the baby very closely. You also may need to take medicine (magnesium sulfate) to prevent seizures and lower blood pressure.  Sometimes an early delivery is needed. This may be the case if the condition worsens. It would   be done to protect you and the baby. The only cure for preeclampsia is delivery. HOME  CARE INSTRUCTIONS  Schedule and keep all of your regular appointments for prenatal care.  Only take over-the-counter or prescription medicines as directed by your health care provider. Tell your health care provider about all medicines you take.  Eat as little salt as possible.  Get regular exercise.  Do not drink alcohol.  Do not use tobacco products.  Do not drink products with caffeine.  Lie on your left side when resting. SEEK IMMEDIATE MEDICAL CARE IF:  You have severe abdominal pain.  You have sudden swelling in the hands, ankles, or face.  You gain 4 pounds (1.8 kg) or more in 1 week.  You vomit repeatedly.  You have vaginal bleeding.  You do not feel the baby moving as much.  You have a headache.  You have blurred or double vision.  You have muscle twitching or spasms.  You have shortness of breath.  You have blue fingernails and lips.  You have blood in your urine. MAKE SURE YOU:  Understand these instructions.  Will watch your condition.  Will get help right away if you are not doing well or get worse. Document Released: 01/30/2011 Document Revised: 03/04/2013 Document Reviewed: 12/11/2012 [employer] Patient Information 2014 [employer].  

## 2013-08-03 NOTE — MAU Provider Note (Signed)
History     CSN: [id number]  Arrival date and time: 08/03/13 2102   First Provider Initiated Contact with Patient 08/03/13 2225      Chief Complaint  Patient presents with  . Headache   HPI Comments: G1 @33 .2 wks c/o mild HA while in childbirth class tonight. No visual changes or epigastric pain. Good FM. Dx with gestational HTN last week, PIH labs were negative.   Headache  This is a new problem. The current episode started today. The problem occurs constantly. The quality of the pain is described as aching. The pain is at a severity of 2/10. The pain is mild. Pertinent negatives include no blurred vision or photophobia.    OB History   Grav Para Term Preterm Abortions TAB SAB Ect Mult Living   1               Past Medical History  Diagnosis Date  . IBS (irritable bowel syndrome)   . Migraine   . GERD (gastroesophageal reflux disease)   . HSV-2 infection     2008  . IC (interstitial cystitis)     2008    Past Surgical History  Procedure Laterality Date  . Shoulder arthroscopy    . Breast surgery  2002    right breast biopsy Benign  . Tonsillectomy    . Knee arthroscopy  3/11, 2012    right knee  . Wisdom tooth extraction      Family History  Problem Relation Age of Onset  . Diabetes Mother   . Hyperlipidemia Mother   . Anemia Mother   . Thyroid disease Sister   . Diabetes Maternal Grandmother   . Diabetes Paternal Grandmother   . Renal Disease Paternal Grandfather     History  Substance Use Topics  . Smoking status: Never Smoker   . Smokeless tobacco: Never Used  . Alcohol Use: Yes     Comment: socially, not while pregnant    Allergies:  Allergies  Allergen Reactions  . Adhesive [Tape] Rash  . Latex Itching and Rash    Rash and itching from use of gloves    Prescriptions prior to admission  Medication Sig Dispense Refill  . fluticasone (FLONASE) 50 MCG/ACT nasal spray Place 2 sprays into both nostrils 2 (two) times daily.      . Loratadine  (CLARITIN PO) Take 1 tablet by mouth daily as needed (congestion, allergies).       . Prenatal Vit-Fe Fumarate-FA (PRENATAL MULTIVITAMIN) TABS tablet Take 1 tablet by mouth daily at 12 noon.      . ranitidine (ZANTAC) 150 MG tablet Take 1 tablet (150 mg total) by mouth 2 (two) times daily.  60 tablet  2  . valACYclovir (VALTREX) 500 MG tablet Take 500 mg by mouth daily. Twice daily for three days when breakout occurs        Review of Systems  Constitutional: Negative.   Eyes: Negative for blurred vision, double vision and photophobia.  Respiratory: Negative.   Cardiovascular: Positive for leg swelling.  Gastrointestinal: Negative.   Genitourinary: Negative.   Musculoskeletal: Negative.   Skin: Negative.   Neurological: Negative.   Endo/Heme/Allergies: Negative.   Psychiatric/Behavioral: Negative.    Physical Exam   Blood pressure 150/86, pulse 97, temperature 98 F (36.7 C), temperature source Oral, resp. rate 17, height 5\' 3"  (1.6 m), weight 86.183 kg (190 lb), last menstrual period 12/13/2012, SpO2 100.00%.  Physical Exam  Constitutional: She is oriented to person, place, and time. She  appears well-developed.  HENT:  Head: Normocephalic.  Neck: Normal range of motion.  Cardiovascular: Normal rate.   Respiratory: Effort normal.  GI: Soft.  Genitourinary:  deferred  Musculoskeletal: Normal range of motion.  Neurological: She is alert and oriented to person, place, and time.  Skin: Skin is warm and dry.  Psychiatric: She has a normal mood and affect.  EFM: 135bpm, moderate variability, accels present, no decels Toco: irregular  MAU Course  Procedures  CMP, uric acid-WNL CBC-anemia UA-negative Assessment and Plan  33.[redacted] weeks gestation Gestational hypertension Anemia  Start Labetalol 200mg  bid Discharge home Follow-up in office in 2 days PIH precautions  Assessment and plan of care discussed with Dr. Jearld Fenton, Faizah Kandler, N 08/03/2013, 11:02 PM

## 2013-08-03 NOTE — MAU Note (Signed)
Pt reports she was here last week with elevated B/P, increased swelling and headache. Was in childbirth class and decided to come up here to have her b/p check.

## 2013-08-17 LAB — OB RESULTS CONSOLE GBS: GBS: NEGATIVE

## 2013-08-22 ENCOUNTER — Encounter (HOSPITAL_COMMUNITY): Payer: Self-pay | Admitting: *Deleted

## 2013-08-22 ENCOUNTER — Inpatient Hospital Stay (HOSPITAL_COMMUNITY)
Admission: AD | Admit: 2013-08-22 | Discharge: 2013-08-23 | Disposition: A | Discharge status: Home / Self Care | Payer: 59 | Source: Ambulatory Visit | Attending: Obstetrics and Gynecology | Admitting: Obstetrics and Gynecology

## 2013-08-22 DIAGNOSIS — R42 Dizziness and giddiness: Secondary | ICD-10-CM | POA: Insufficient documentation

## 2013-08-22 DIAGNOSIS — O98519 Other viral diseases complicating pregnancy, unspecified trimester: Secondary | ICD-10-CM | POA: Insufficient documentation

## 2013-08-22 DIAGNOSIS — A6000 Herpesviral infection of urogenital system, unspecified: Secondary | ICD-10-CM | POA: Diagnosis not present

## 2013-08-22 DIAGNOSIS — O9989 Other specified diseases and conditions complicating pregnancy, childbirth and the puerperium: Principal | ICD-10-CM

## 2013-08-22 DIAGNOSIS — O99891 Other specified diseases and conditions complicating pregnancy: Secondary | ICD-10-CM | POA: Diagnosis present

## 2013-08-22 LAB — COMPREHENSIVE METABOLIC PANEL
ALBUMIN: 2.8 g/dL — AB (ref 3.5–5.2)
ALT: 11 U/L (ref 0–35)
AST: 24 U/L (ref 0–37)
Alkaline Phosphatase: 247 U/L — ABNORMAL HIGH (ref 39–117)
BILIRUBIN TOTAL: 0.6 mg/dL (ref 0.3–1.2)
BUN: 8 mg/dL (ref 6–23)
CALCIUM: 9.5 mg/dL (ref 8.4–10.5)
CO2: 25 mEq/L (ref 19–32)
Chloride: 101 mEq/L (ref 96–112)
Creatinine, Ser: 0.81 mg/dL (ref 0.50–1.10)
GFR calc Af Amer: >90 mL/min (ref >90)
GFR calc non Af Amer: >90 mL/min (ref >90)
Glucose, Bld: 78 mg/dL (ref 70–99)
Potassium: 4.4 mEq/L (ref 3.7–5.3)
Sodium: 138 mEq/L (ref 137–147)
TOTAL PROTEIN: 6 g/dL (ref 6.0–8.3)

## 2013-08-22 LAB — CBC
HEMATOCRIT: 29.1 % — AB (ref 36.0–46.0)
Hemoglobin: 10.7 g/dL — ABNORMAL LOW (ref 12.0–15.0)
MCH: 30.1 pg (ref 26.0–34.0)
MCHC: 36.8 g/dL — AB (ref 30.0–36.0)
MCV: 81.7 fL (ref 78.0–100.0)
PLATELETS: 131 10*3/uL — AB (ref 150–400)
RBC: 3.56 MIL/uL — ABNORMAL LOW (ref 3.87–5.11)
RDW: 13.3 % (ref 11.5–15.5)
WBC: 10.1 10*3/uL (ref 4.0–10.5)

## 2013-08-22 LAB — PROTEIN / CREATININE RATIO, URINE
Creatinine, Urine: 24.71 mg/dL
PROTEIN CREATININE RATIO: 0.16 — AB (ref 0.00–0.15)
Total Protein, Urine: 4 mg/dL

## 2013-08-22 LAB — URINALYSIS, ROUTINE W REFLEX MICROSCOPIC
Bilirubin Urine: NEGATIVE
GLUCOSE, UA: NEGATIVE mg/dL
Hgb urine dipstick: NEGATIVE
Ketones, ur: NEGATIVE mg/dL
LEUKOCYTES UA: NEGATIVE
NITRITE: NEGATIVE
PH: 5.5 (ref 5.0–8.0)
Protein, ur: NEGATIVE mg/dL
Specific Gravity, Urine: <1.005 — ABNORMAL LOW (ref 1.005–1.030)
Urobilinogen, UA: 0.2 mg/dL (ref 0.0–1.0)

## 2013-08-22 LAB — URIC ACID: Uric Acid, Serum: 8 mg/dL — ABNORMAL HIGH (ref 2.4–7.0)

## 2013-08-22 MED ORDER — LABETALOL HCL 100 MG PO TABS
100.0000 mg | ORAL_TABLET | Freq: Once | ORAL | Status: AC
  Administered 2013-08-22: 100 mg via ORAL
  Filled 2013-08-22: qty 1

## 2013-08-22 NOTE — MAU Provider Note (Signed)
History     No chief complaint on file.  Cc: dizziness, h/a, BP 162/94 @ home  38 yo G1P0 SBF on labetalol for PIH presents at 36 1/[redacted] weeks gestation with above complaint. Pt took labetalol @ 11 am. Pt notes h/a since 6 pm but has not taken any meds for it. Admits to eating some grapes @ 8 pm. Pt 's partner reports patient has not been eating well. No visual changes or epigastric pain. Pt has not been resting. (+) leg swelling. (+) FM (-) vaginal bleeding. Some ctx. Pt admits today of HSV outbreak 3/20. She is on Valtrex  OB History   Grav Para Term Preterm Abortions TAB SAB Ect Mult Living   1               Past Medical History  Diagnosis Date  . IBS (irritable bowel syndrome)   . Migraine   . GERD (gastroesophageal reflux disease)   . HSV-2 infection     2008  . IC (interstitial cystitis)     2008    Past Surgical History  Procedure Laterality Date  . Shoulder arthroscopy    . Breast surgery  2002    right breast biopsy Benign  . Tonsillectomy    . Knee arthroscopy  3/11, 2012    right knee  . Wisdom tooth extraction      Family History  Problem Relation Age of Onset  . Diabetes Mother   . Hyperlipidemia Mother   . Anemia Mother   . Thyroid disease Sister   . Diabetes Maternal Grandmother   . Diabetes Paternal Grandmother   . Renal Disease Paternal Grandfather     History  Substance Use Topics  . Smoking status: Never Smoker   . Smokeless tobacco: Never Used  . Alcohol Use: Yes     Comment: socially, not while pregnant    Allergies:  Allergies  Allergen Reactions  . Adhesive [Tape] Rash  . Latex Itching and Rash    Rash and itching from use of gloves    Prescriptions prior to admission  Medication Sig Dispense Refill  . acetaminophen (TYLENOL) 325 MG tablet Take 650 mg by mouth every 6 (six) hours as needed for headache.      . fluticasone (FLONASE) 50 MCG/ACT nasal spray Place 2 sprays into both nostrils 2 (two) times daily.      Marland Kitchen labetalol  (NORMODYNE) 100 MG tablet Take 100 mg by mouth 2 (two) times daily.      . Prenatal Vit-Fe Fumarate-FA (PRENATAL MULTIVITAMIN) TABS tablet Take 1 tablet by mouth daily at 12 noon.      . ranitidine (ZANTAC) 150 MG tablet Take 1 tablet (150 mg total) by mouth 2 (two) times daily.  60 tablet  2  . valACYclovir (VALTREX) 500 MG tablet Take 500 mg by mouth daily. Twice daily for three days when breakout occurs         Physical Exam   Blood pressure 175/113, pulse 93, temperature 98 F (36.7 C), temperature source Oral, height 5\' 4"  (1.626 m), weight 87.261 kg (192 lb 6 oz), last menstrual period 12/13/2012. BP 146/86  Pulse 92  Temp(Src) 98 F (36.7 C) (Oral)  Resp 18  Ht 5\' 4"  (1.626 m)  Wt 87.261 kg (192 lb 6 oz)  BMI 33.00 kg/m2  LMP 12/13/2012  General appearance: alert, cooperative and no distress Lungs: clear to auscultation bilaterally Abdomen: gravid nontender Pelvic: closed/uneffaced/-3 Extremities: edema 2 + no clonus DTR 2 +  Tracing: baseline 130 (+) accels reactive irregular ctx  CBC    Component Value Date/Time   WBC 10.1 08/22/2013 2120   RBC 3.56* 08/22/2013 2120   HGB 10.7* 08/22/2013 2120   HCT 29.1* 08/22/2013 2120   PLT 131* 08/22/2013 2120   MCV 81.7 08/22/2013 2120   MCH 30.1 08/22/2013 2120   MCHC 36.8* 08/22/2013 2120   RDW 13.3 08/22/2013 2120   LYMPHSABS 3.1 01/27/2012 0007   MONOABS 0.6 01/27/2012 0007   EOSABS 0.1 01/27/2012 0007   BASOSABS 0.0 01/27/2012 0007  . Urinalysis> no proteinuria Uric acid 8.0 Nl ALT/AST  BMET    Component Value Date/Time   NA 138 08/22/2013 2120   K 4.4 08/22/2013 2120   CL 101 08/22/2013 2120   CO2 25 08/22/2013 2120   GLUCOSE 78 08/22/2013 2120   BUN 8 08/22/2013 2120   CREATININE 0.81 08/22/2013 2120   CREATININE 0.92 10/14/2012 1435   CALCIUM 9.5 08/22/2013 2120   GFRNONAA >90 08/22/2013 2120   GFRAA >90 08/22/2013 2120     ED Course  PIH without worsening of sx. Sx related to poor intake IUP @ 36 1/7 weeks HSV on valtrex  with recent outbreak P) given labetalol now . Do 24 hr UTP/CrCL. Disc window of vaginal delivery( 2 weeks from outbreak cleared). Disc need to eat and to conform to resting. Cont labetalol. Reiterate PIH warning signs. Tylenol 1 gram for h/a D/c home after several BP done given isolated increase BP. If remains elevated then need admission MDM   Morad Tal A, MD 11:12 PM 08/22/2013

## 2013-08-22 NOTE — MAU Note (Addendum)
LABS DRAWN IN TRIAGE.  PT SAYS SHE TOOK BP  AT HOME-   162/94.    DIZZY STARTED  8PM-   SHE WAS  WALKING  INTO HOUSE- SAT DOWN THEN SHE FELT "ROOM MOVING"    HAS H/A- STARTED  AT 6 PM-   NO MEDS.    SHE CALLED OFFICE AT 846PM.       SHE ALSO HAD DIZZINESS  ON Thursday-  WENT AWAY.      LAST TIME IN MAU- HAD H/A.   Monday IN OFFICE - CLOSED.   HAS HX OF HSV- LAST OUTBREAK- 08-14-2013-  IS TAKING VALTREX-   NOW  STILL FEELS SOME  S/S-    DENIES MRSA.    PT IS GOING TO BE INDUCED  AT 37    WEEKS.

## 2013-08-23 DIAGNOSIS — O99891 Other specified diseases and conditions complicating pregnancy: Secondary | ICD-10-CM | POA: Diagnosis not present

## 2013-08-23 MED ORDER — ACETAMINOPHEN 500 MG PO TABS
1000.0000 mg | ORAL_TABLET | Freq: Once | ORAL | Status: AC
  Administered 2013-08-23: 1000 mg via ORAL
  Filled 2013-08-23: qty 2

## 2013-08-23 NOTE — Discharge Instructions (Signed)
24-Hour Urine Collection HOME CARE  When you get up in the morning on the day you do this test, pee (urinate) in the toilet and flush. Make a note of the time. This will be your start time on the day of collection and the end time on the next morning.  From then on, save all your pee (urine) in the plastic jug that was given to you.  You should stop collecting your pee 24 hours after you started.  If the plastic jug that is given to you already has liquid in it, that is okay. Do not throw out the liquid or rinse out the jug. Some tests need the liquid to be added to your pee.  Keep your plastic jug cool (in an ice chest or the refrigerator) during the test.  When the 24 hours is over, bring your plastic jug to the clinic lab. Keep the jug cool (in an ice chest) while you are bringing it to the lab. Document Released: 08/10/2008 Document Revised: 08/06/2011 Document Reviewed: 08/10/2008 [facility] Patient Information 2014 [place], [place]. Braxton Hicks Contractions Pregnancy is commonly associated with contractions of the uterus throughout the pregnancy. Towards the end of pregnancy (32 to 34 weeks), these contractions [facility] Ishmael Holter) can develop more often and may become more forceful. This is not true labor because these contractions do not result in opening (dilatation) and thinning of the cervix. They are sometimes difficult to tell apart from true labor because these contractions can be forceful and people have different pain tolerances. You should not feel embarrassed if you go to the hospital with false labor. Sometimes, the only way to tell if you are in true labor is for your caregiver to follow the changes in the cervix. How to tell the difference between true and false labor:  False labor.  The contractions of false labor are usually shorter, irregular and not as hard as those of true labor.  They are often felt in the front of the lower abdomen and in the groin.  They may leave  with walking around or changing positions while lying down.  They get weaker and are shorter lasting as time goes on.  These contractions are usually irregular.  They do not usually become progressively stronger, regular and closer together as with true labor.  True labor.  Contractions in true labor last 30 to 70 seconds, become very regular, usually become more intense, and increase in frequency.  They do not go away with walking.  The discomfort is usually felt in the top of the uterus and spreads to the lower abdomen and low back.  True labor can be determined by your caregiver with an exam. This will show that the cervix is dilating and getting thinner. If there are no prenatal problems or other health problems associated with the pregnancy, it is completely safe to be sent home with false labor and await the onset of true labor. HOME CARE INSTRUCTIONS   Keep up with your usual exercises and instructions.  Take medications as directed.  Keep your regular prenatal appointment.  Eat and drink lightly if you think you are going into labor.  If BH contractions are making you uncomfortable:  Change your activity position from lying down or resting to walking/walking to resting.  Sit and rest in a tub of warm water.  Drink 2 to 3 glasses of water. Dehydration may cause B-H contractions.  Do slow and deep breathing several times an hour. SEEK IMMEDIATE MEDICAL CARE IF:  Your contractions continue to become stronger, more regular, and closer together.  You have a gushing, burst or leaking of fluid from the vagina.  An oral temperature above 102 F (38.9 C) develops.  You have passage of blood-tinged mucus.  You develop vaginal bleeding.  You develop continuous belly (abdominal) pain.  You have low back pain that you never had before.  You feel the baby's head pushing down causing pelvic pressure.  The baby is not moving as much as it used to. Document Released:  05/14/2005 Document Revised: 08/06/2011 Document Reviewed: 02/23/2013 [facility] Patient Information 2014 [place], [place]. Fetal Movement Counts Patient Name: __________________________________________________ Patient Due Date: ____________________ Performing a fetal movement count is highly recommended in high-risk pregnancies, but it is good for every pregnant woman to do. Your caregiver may ask you to start counting fetal movements at 28 weeks of the pregnancy. Fetal movements often increase:  After eating a full meal.  After physical activity.  After eating or drinking something sweet or cold.  At rest. Pay attention to when you feel the baby is most active. This will help you notice a pattern of your baby's sleep and wake cycles and what factors contribute to an increase in fetal movement. It is important to perform a fetal movement count at the same time each day when your baby is normally most active.  HOW TO COUNT FETAL MOVEMENTS 1. Find a quiet and comfortable area to sit or lie down on your left side. Lying on your left side provides the best blood and oxygen circulation to your baby. 2. Write down the day and time on a sheet of paper or in a journal. 3. Start counting kicks, flutters, swishes, rolls, or jabs in a 2 hour period. You should feel at least 10 movements within 2 hours. 4. If you do not feel 10 movements in 2 hours, wait 2 3 hours and count again. Look for a change in the pattern or not enough counts in 2 hours. SEEK MEDICAL CARE IF:  You feel less than 10 counts in 2 hours, tried twice.  There is no movement in over an hour.  The pattern is changing or taking longer each day to reach 10 counts in 2 hours.  You feel the baby is not moving as he or she usually does. Date: ____________ Movements: ____________ Start time: ____________ Elizebeth Koller time: ____________  Date: ____________ Movements: ____________ Start time: ____________ Elizebeth Koller time: ____________ Date:  ____________ Movements: ____________ Start time: ____________ Elizebeth Koller time: ____________ Date: ____________ Movements: ____________ Start time: ____________ Elizebeth Koller time: ____________ Date: ____________ Movements: ____________ Start time: ____________ Elizebeth Koller time: ____________ Date: ____________ Movements: ____________ Start time: ____________ Elizebeth Koller time: ____________ Date: ____________ Movements: ____________ Start time: ____________ Elizebeth Koller time: ____________ Date: ____________ Movements: ____________ Start time: ____________ Elizebeth Koller time: ____________  Date: ____________ Movements: ____________ Start time: ____________ Elizebeth Koller time: ____________ Date: ____________ Movements: ____________ Start time: ____________ Elizebeth Koller time: ____________ Date: ____________ Movements: ____________ Start time: ____________ Elizebeth Koller time: ____________ Date: ____________ Movements: ____________ Start time: ____________ Elizebeth Koller time: ____________ Date: ____________ Movements: ____________ Start time: ____________ Elizebeth Koller time: ____________ Date: ____________ Movements: ____________ Start time: ____________ Elizebeth Koller time: ____________ Date: ____________ Movements: ____________ Start time: ____________ Elizebeth Koller time: ____________  Date: ____________ Movements: ____________ Start time: ____________ Elizebeth Koller time: ____________ Date: ____________ Movements: ____________ Start time: ____________ Elizebeth Koller time: ____________ Date: ____________ Movements: ____________ Start time: ____________ Elizebeth Koller time: ____________ Date: ____________ Movements: ____________ Start time: ____________ Elizebeth Koller time: ____________ Date: ____________ Movements: ____________ Start time: ____________ Elizebeth Koller  time: ____________ Date: ____________ Movements: ____________ Start time: ____________ Elizebeth Koller time: ____________ Date: ____________ Movements: ____________ Start time: ____________ Elizebeth Koller time: ____________  Date: ____________ Movements: ____________ Start  time: ____________ Elizebeth Koller time: ____________ Date: ____________ Movements: ____________ Start time: ____________ Elizebeth Koller time: ____________ Date: ____________ Movements: ____________ Start time: ____________ Elizebeth Koller time: ____________ Date: ____________ Movements: ____________ Start time: ____________ Elizebeth Koller time: ____________ Date: ____________ Movements: ____________ Start time: ____________ Elizebeth Koller time: ____________ Date: ____________ Movements: ____________ Start time: ____________ Elizebeth Koller time: ____________ Date: ____________ Movements: ____________ Start time: ____________ Elizebeth Koller time: ____________  Date: ____________ Movements: ____________ Start time: ____________ Elizebeth Koller time: ____________ Date: ____________ Movements: ____________ Start time: ____________ Elizebeth Koller time: ____________ Date: ____________ Movements: ____________ Start time: ____________ Elizebeth Koller time: ____________ Date: ____________ Movements: ____________ Start time: ____________ Elizebeth Koller time: ____________ Date: ____________ Movements: ____________ Start time: ____________ Elizebeth Koller time: ____________ Date: ____________ Movements: ____________ Start time: ____________ Elizebeth Koller time: ____________ Date: ____________ Movements: ____________ Start time: ____________ Elizebeth Koller time: ____________  Date: ____________ Movements: ____________ Start time: ____________ Elizebeth Koller time: ____________ Date: ____________ Movements: ____________ Start time: ____________ Elizebeth Koller time: ____________ Date: ____________ Movements: ____________ Start time: ____________ Elizebeth Koller time: ____________ Date: ____________ Movements: ____________ Start time: ____________ Elizebeth Koller time: ____________ Date: ____________ Movements: ____________ Start time: ____________ Elizebeth Koller time: ____________ Date: ____________ Movements: ____________ Start time: ____________ Elizebeth Koller time: ____________ Date: ____________ Movements: ____________ Start time: ____________ Elizebeth Koller time: ____________    Date: ____________ Movements: ____________ Start time: ____________ Elizebeth Koller time: ____________ Date: ____________ Movements: ____________ Start time: ____________ Elizebeth Koller time: ____________ Date: ____________ Movements: ____________ Start time: ____________ Elizebeth Koller time: ____________ Date: ____________ Movements: ____________ Start time: ____________ Elizebeth Koller time: ____________ Date: ____________ Movements: ____________ Start time: ____________ Elizebeth Koller time: ____________ Date: ____________ Movements: ____________ Start time: ____________ Elizebeth Koller time: ____________ Date: ____________ Movements: ____________ Start time: ____________ Elizebeth Koller time: ____________  Date: ____________ Movements: ____________ Start time: ____________ Elizebeth Koller time: ____________ Date: ____________ Movements: ____________ Start time: ____________ Elizebeth Koller time: ____________ Date: ____________ Movements: ____________ Start time: ____________ Elizebeth Koller time: ____________ Date: ____________ Movements: ____________ Start time: ____________ Elizebeth Koller time: ____________ Date: ____________ Movements: ____________ Start time: ____________ Elizebeth Koller time: ____________ Date: ____________ Movements: ____________ Start time: ____________ Elizebeth Koller time: ____________ Document Released: 06/13/2006 Document Revised: 04/30/2012 Document Reviewed: 03/10/2012 [employer] Patient Information 2014 [employer]. Hypertension During Pregnancy Hypertension is also called high blood pressure. Blood pressure moves blood in your body. Sometimes, the force that moves the blood becomes too strong. When you are pregnant, this condition should be watched carefully. It can cause problems for you and your baby. HOME CARE   Make and keep all of your doctor visits.  Take medicine as told by your doctor. Tell your doctor about all medicines you take.  Eat very little salt.  Exercise regularly.  Do not drink alcohol.  Do not smoke.  Do not have drinks with  caffeine.  Lie on your left side when resting. GET HELP RIGHT AWAY IF:  You have bad belly (abdominal) pain.  You have sudden puffiness (swelling) in the hands, ankles, or face.  You gain 4 pounds (1.8 kilograms) or more in 1 week.  You throw up (vomit) repeatedly.  You have bleeding from the vagina.  You do not feel the baby moving as much.  You have a headache.  You have blurred or double vision.  You have muscle twitching or spasms.  You have shortness of breath.  You have blue fingernails and lips.  You have blood in your pee (urine). MAKE SURE YOU:  Understand these instructions.  Will  watch your condition.  Will get help right away if you are not doing well or get worse. Document Released: 06/16/2010 Document Revised: 03/04/2013 Document Reviewed: 12/11/2012 [facility] Patient Information 2014 [place], [place].

## 2013-08-25 ENCOUNTER — Other Ambulatory Visit: Payer: Self-pay | Admitting: Obstetrics and Gynecology

## 2013-08-26 ENCOUNTER — Telehealth (HOSPITAL_COMMUNITY): Payer: Self-pay | Admitting: *Deleted

## 2013-08-26 ENCOUNTER — Encounter (HOSPITAL_COMMUNITY): Payer: Self-pay | Admitting: *Deleted

## 2013-08-26 NOTE — Telephone Encounter (Signed)
Preadmission screen  

## 2013-08-29 ENCOUNTER — Encounter (HOSPITAL_COMMUNITY): Payer: Self-pay

## 2013-08-29 ENCOUNTER — Inpatient Hospital Stay (HOSPITAL_COMMUNITY)
Admission: RE | Admit: 2013-08-29 | Discharge: 2013-09-02 | DRG: 765 | Disposition: A | Discharge status: Home / Self Care | Payer: 59 | Source: Ambulatory Visit | Attending: Obstetrics and Gynecology | Admitting: Obstetrics and Gynecology

## 2013-08-29 DIAGNOSIS — D62 Acute posthemorrhagic anemia: Secondary | ICD-10-CM | POA: Diagnosis not present

## 2013-08-29 DIAGNOSIS — O3410 Maternal care for benign tumor of corpus uteri, unspecified trimester: Secondary | ICD-10-CM

## 2013-08-29 DIAGNOSIS — K219 Gastro-esophageal reflux disease without esophagitis: Secondary | ICD-10-CM | POA: Diagnosis present

## 2013-08-29 DIAGNOSIS — D259 Leiomyoma of uterus, unspecified: Secondary | ICD-10-CM | POA: Diagnosis present

## 2013-08-29 DIAGNOSIS — A6000 Herpesviral infection of urogenital system, unspecified: Secondary | ICD-10-CM | POA: Diagnosis present

## 2013-08-29 DIAGNOSIS — K589 Irritable bowel syndrome without diarrhea: Secondary | ICD-10-CM | POA: Diagnosis present

## 2013-08-29 DIAGNOSIS — O09519 Supervision of elderly primigravida, unspecified trimester: Secondary | ICD-10-CM | POA: Diagnosis present

## 2013-08-29 DIAGNOSIS — O9903 Anemia complicating the puerperium: Secondary | ICD-10-CM | POA: Diagnosis not present

## 2013-08-29 DIAGNOSIS — O98519 Other viral diseases complicating pregnancy, unspecified trimester: Principal | ICD-10-CM | POA: Diagnosis present

## 2013-08-29 DIAGNOSIS — O139 Gestational [pregnancy-induced] hypertension without significant proteinuria, unspecified trimester: Secondary | ICD-10-CM | POA: Diagnosis present

## 2013-08-29 DIAGNOSIS — D4959 Neoplasm of unspecified behavior of other genitourinary organ: Secondary | ICD-10-CM | POA: Diagnosis present

## 2013-08-29 DIAGNOSIS — O34599 Maternal care for other abnormalities of gravid uterus, unspecified trimester: Secondary | ICD-10-CM | POA: Diagnosis present

## 2013-08-29 LAB — COMPREHENSIVE METABOLIC PANEL
ALBUMIN: 2.9 g/dL — AB (ref 3.5–5.2)
ALT: 13 U/L (ref 0–35)
AST: 29 U/L (ref 0–37)
Alkaline Phosphatase: 277 U/L — ABNORMAL HIGH (ref 39–117)
BUN: 13 mg/dL (ref 6–23)
CALCIUM: 9.4 mg/dL (ref 8.4–10.5)
CO2: 22 mEq/L (ref 19–32)
Chloride: 99 mEq/L (ref 96–112)
Creatinine, Ser: 0.91 mg/dL (ref 0.50–1.10)
GFR calc Af Amer: >90 mL/min (ref >90)
GFR calc non Af Amer: 80 mL/min — ABNORMAL LOW (ref >90)
Glucose, Bld: 78 mg/dL (ref 70–99)
Potassium: 4.4 mEq/L (ref 3.7–5.3)
Sodium: 132 mEq/L — ABNORMAL LOW (ref 137–147)
TOTAL PROTEIN: 6 g/dL (ref 6.0–8.3)
Total Bilirubin: 0.7 mg/dL (ref 0.3–1.2)

## 2013-08-29 LAB — URINALYSIS, ROUTINE W REFLEX MICROSCOPIC
Bilirubin Urine: NEGATIVE
Glucose, UA: NEGATIVE mg/dL
HGB URINE DIPSTICK: NEGATIVE
Ketones, ur: NEGATIVE mg/dL
Leukocytes, UA: NEGATIVE
NITRITE: NEGATIVE
PH: 6 (ref 5.0–8.0)
Protein, ur: NEGATIVE mg/dL
SPECIFIC GRAVITY, URINE: 1.01 (ref 1.005–1.030)
Urobilinogen, UA: 0.2 mg/dL (ref 0.0–1.0)

## 2013-08-29 LAB — CBC
HCT: 29.2 % — ABNORMAL LOW (ref 36.0–46.0)
Hemoglobin: 10.8 g/dL — ABNORMAL LOW (ref 12.0–15.0)
MCH: 30.7 pg (ref 26.0–34.0)
MCHC: 37 g/dL — ABNORMAL HIGH (ref 30.0–36.0)
MCV: 83 fL (ref 78.0–100.0)
Platelets: 159 10*3/uL (ref 150–400)
RBC: 3.52 MIL/uL — AB (ref 3.87–5.11)
RDW: 13.4 % (ref 11.5–15.5)
WBC: 9.2 10*3/uL (ref 4.0–10.5)

## 2013-08-29 LAB — URIC ACID: Uric Acid, Serum: 8.6 mg/dL — ABNORMAL HIGH (ref 2.4–7.0)

## 2013-08-29 MED ORDER — ONDANSETRON HCL 4 MG/2ML IJ SOLN
4.0000 mg | Freq: Four times a day (QID) | INTRAMUSCULAR | Status: DC | PRN
Start: 2013-08-29 — End: 2013-09-02

## 2013-08-29 MED ORDER — OXYCODONE-ACETAMINOPHEN 5-325 MG PO TABS: 1.0000 | ORAL_TABLET | ORAL | Status: DC | PRN

## 2013-08-29 MED ORDER — OXYTOCIN BOLUS FROM INFUSION: 500.0000 mL | INTRAVENOUS | Status: DC

## 2013-08-29 MED ORDER — LABETALOL HCL 5 MG/ML IV SOLN
20.0000 mg | Freq: Once | INTRAVENOUS | Status: AC
  Administered 2013-08-29: 20 mg via INTRAVENOUS
  Filled 2013-08-29: qty 4

## 2013-08-29 MED ORDER — ACETAMINOPHEN 325 MG PO TABS
650.0000 mg | ORAL_TABLET | ORAL | Status: DC | PRN
  Administered 2013-08-29: 650 mg via ORAL
  Filled 2013-08-29: qty 2

## 2013-08-29 MED ORDER — OXYTOCIN 10 UNIT/ML IJ SOLN: 10.0000 [IU] | Freq: Once | INTRAMUSCULAR | Status: DC

## 2013-08-29 MED ORDER — BUPIVACAINE HCL (PF) 0.25 % IJ SOLN
INTRAMUSCULAR | Status: AC
  Filled 2013-08-29: qty 30

## 2013-08-29 MED ORDER — LIDOCAINE HCL (PF) 1 % IJ SOLN: 30.0000 mL | INTRAMUSCULAR | Status: DC | PRN

## 2013-08-29 MED ORDER — OXYTOCIN 40 UNITS IN LACTATED RINGERS INFUSION - SIMPLE MED: 62.5000 mL/h | INTRAVENOUS | Status: DC

## 2013-08-29 MED ORDER — CITRIC ACID-SODIUM CITRATE 334-500 MG/5ML PO SOLN
30.0000 mL | ORAL | Status: DC | PRN
  Administered 2013-08-30: 30 mL via ORAL
  Filled 2013-08-29: qty 15

## 2013-08-29 MED ORDER — LACTATED RINGERS IV SOLN: 500.0000 mL | INTRAVENOUS | Status: DC | PRN

## 2013-08-29 MED ORDER — LABETALOL HCL 200 MG PO TABS
200.0000 mg | ORAL_TABLET | Freq: Two times a day (BID) | ORAL | Status: DC
  Administered 2013-08-30 – 2013-09-02 (×7): 200 mg via ORAL
  Filled 2013-08-29 (×10): qty 1

## 2013-08-29 MED ORDER — LACTATED RINGERS IV SOLN
INTRAVENOUS | Status: DC
  Administered 2013-08-30 – 2013-08-31 (×3): via INTRAVENOUS

## 2013-08-29 MED ORDER — IBUPROFEN 600 MG PO TABS: 600.0000 mg | ORAL_TABLET | Freq: Four times a day (QID) | ORAL | Status: DC | PRN

## 2013-08-29 MED ORDER — BUTORPHANOL TARTRATE 1 MG/ML IJ SOLN
1.0000 mg | INTRAMUSCULAR | Status: DC | PRN
  Filled 2013-08-29: qty 1

## 2013-08-29 MED ORDER — LABETALOL HCL 100 MG PO TABS
100.0000 mg | ORAL_TABLET | Freq: Two times a day (BID) | ORAL | Status: DC
  Administered 2013-08-29: 100 mg via ORAL
  Filled 2013-08-29 (×2): qty 1

## 2013-08-29 NOTE — H&P (Addendum)
Denise [name] is a 38 y.o. femaleG1P0 SBF @ 37 weeks admitted for IOL 2nd to [place] on labetalol. 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 Pt reports sx of lesion for past few days (+) h/a. (+) dizziness (-) visual changes or epigastric pain (+) leg swelling. Last [place] labs 3/28 were nl except uric acid of 8.0 and plt ct of 131K. Pt had 24 hr  UTP 4/1 which was 176 mg . History OB History   Grav Para Term Preterm Abortions TAB SAB Ect Mult Living   1              Past Medical History  Diagnosis Date  . IBS (irritable bowel syndrome)   . Migraine   . GERD (gastroesophageal reflux disease)   . HSV-2 infection     2008  . IC (interstitial cystitis)     2008  . Pregnancy induced hypertension    Past Surgical History  Procedure Laterality Date  . Shoulder arthroscopy    . Breast surgery  2002    right breast biopsy Benign  . Tonsillectomy    . Knee arthroscopy  3/11, 2012    right knee  . Wisdom tooth extraction     Family History: family history includes Anemia in her mother; Diabetes in her maternal grandmother, mother, and paternal grandmother; Heart disease in her paternal grandfather; Hyperlipidemia in her mother; Renal Disease in her paternal grandfather; Stroke in her paternal grandmother; Thyroid disease in her sister. Social History:  reports that she has never smoked. She has never used smokeless tobacco. She reports that she does not drink alcohol or use illicit drugs.   Prenatal Transfer Tool  Maternal Diabetes: No Genetic Screening: Normal Maternal Ultrasounds/Referrals: Normal Fetal Ultrasounds or other Referrals:  None Maternal Substance Abuse:  No Significant Maternal Medications:  Meds include: Other:  labetalol, valtrex Significant Maternal Lab Results:  Lab values include: Group B Strep negative Other Comments:  HSV  with recent outbreak  Review of Systems  Eyes: Negative for blurred vision.  Cardiovascular: Positive for leg swelling.  Gastrointestinal: Negative for  heartburn.  Neurological: Positive for dizziness.  All other systems reviewed and are negative.       Blood pressure 166/107, pulse 92, temperature 98.2 F (36.8 C), temperature source Oral, height 5\' 3"  (1.6 m), weight 87.091 kg (192 lb), last menstrual period 12/13/2012. Maternal Exam:  Uterine Assessment: Contraction frequency is rare.   Abdomen: Patient reports no abdominal tenderness. Fetal presentation: vertex  Introitus: Vulva is positive for lesion. Ferning test: not done.  Ulcerated area post forcette     Physical Exam  Constitutional: She is oriented to person, place, and time. She appears well-developed and well-nourished.  HENT:  Head: Normocephalic.  Eyes: EOM are normal.  Neck: Neck supple.  Cardiovascular: Normal rate and regular rhythm.   Respiratory: Breath sounds normal.  GI: Soft.  Gravid nontender  Genitourinary: Vulva exhibits lesion.  Musculoskeletal: She exhibits edema.  2-3 + edema  Neurological: She is alert and oriented to person, place, and time.  Skin: Skin is warm and dry.  Psychiatric: She has a normal mood and affect.    Prenatal labs: ABO, Rh: O/Positive/-- (09/15 0000) Antibody: Negative (09/15 0000) Rubella: Immune (09/15 0000) RPR: Nonreactive (09/15 0000)  HBsAg: Negative (09/15 0000)  HIV: Non-reactive (09/15 0000)  GBS: Negative (03/23 0000)   CBC    Component Value Date/Time   WBC 9.2 08/29/2013 2110   RBC 3.52* 08/29/2013 2110  HGB 10.8* 08/29/2013 2110   HCT 29.2* 08/29/2013 2110   PLT 159 08/29/2013 2110   MCV 83.0 08/29/2013 2110   MCH 30.7 08/29/2013 2110   MCHC 37.0* 08/29/2013 2110   RDW 13.4 08/29/2013 2110   LYMPHSABS 3.1 01/27/2012 0007   MONOABS 0.6 01/27/2012 0007   EOSABS 0.1 01/27/2012 0007   BASOSABS 0.0 01/27/2012 0007   CMP     Component Value Date/Time   NA 132* 08/29/2013 2110   K 4.4 08/29/2013 2110   CL 99 08/29/2013 2110   CO2 22 08/29/2013 2110   GLUCOSE 78 08/29/2013 2110   BUN 13 08/29/2013 2110   CREATININE 0.91  08/29/2013 2110   CREATININE 0.92 10/14/2012 1435   CALCIUM 9.4 08/29/2013 2110   PROT 6.0 08/29/2013 2110   ALBUMIN 2.9* 08/29/2013 2110   AST 29 08/29/2013 2110   ALT 13 08/29/2013 2110   ALKPHOS 277* 08/29/2013 2110   BILITOT 0.7 08/29/2013 2110   GFRNONAA 80* 08/29/2013 2110   GFRAA >90 08/29/2013 2110   Uric acid: 8.6  Tracing: baseline 135 (+) accels 155 irreg ctx  Assessment/Plan: Active HSV Worsening PIH on labetalol IUP @ 37 week P) admit  Primary C./S in am ( pt ate @ 6;30 pm), increase oral labetalol. [facility] labs. Add  HCTZ postpartum. IV labetalol/apresoline prn. Routine labs  Denise [name] A 08/29/2013, 11:10 PM  Risk of surgery reviewed with pt: including infection, bleeding, possible blood transfusion and its risk ( HIV, infection, acute rxn), injury to surrounding organ structures, internal scar tissue. Consent signed.

## 2013-08-29 NOTE — Progress Notes (Signed)
Patient C/O burning sensation between vagina and rectum when she wipes. Thinks it might be HSV outbreak. Visual examination, 1.5 cm raw circle at the base of the vagina, exactly where patient described burning.  Reported to Dr. Garwin Brothers, she spoke with patient regarding canceling induction and scheduling C/S next week. Will draw labs and report results to Dr. Garwin Brothers before patient is sent home. Humberto Leep, RN

## 2013-08-30 ENCOUNTER — Inpatient Hospital Stay (HOSPITAL_COMMUNITY): Payer: 59

## 2013-08-30 ENCOUNTER — Encounter (HOSPITAL_COMMUNITY): Payer: Self-pay

## 2013-08-30 ENCOUNTER — Encounter (HOSPITAL_COMMUNITY)
Admission: RE | Disposition: A | Discharge status: Home / Self Care | Payer: Self-pay | Source: Ambulatory Visit | Attending: Obstetrics and Gynecology

## 2013-08-30 ENCOUNTER — Encounter (HOSPITAL_COMMUNITY): Payer: 59

## 2013-08-30 LAB — CBC WITH DIFFERENTIAL/PLATELET
BASOS PCT: 0 % (ref 0–1)
Basophils Absolute: 0 10*3/uL (ref 0.0–0.1)
Eosinophils Absolute: 0 10*3/uL (ref 0.0–0.7)
Eosinophils Relative: 0 % (ref 0–5)
HCT: 27.2 % — ABNORMAL LOW (ref 36.0–46.0)
Hemoglobin: 9.9 g/dL — ABNORMAL LOW (ref 12.0–15.0)
Lymphocytes Relative: 28 % (ref 12–46)
Lymphs Abs: 2.3 10*3/uL (ref 0.7–4.0)
MCH: 30.2 pg (ref 26.0–34.0)
MCHC: 36.4 g/dL — AB (ref 30.0–36.0)
MCV: 82.9 fL (ref 78.0–100.0)
MONOS PCT: 11 % (ref 3–12)
Monocytes Absolute: 0.9 10*3/uL (ref 0.1–1.0)
NEUTROS PCT: 60 % (ref 43–77)
Neutro Abs: 5 10*3/uL (ref 1.7–7.7)
PLATELETS: 153 10*3/uL (ref 150–400)
RBC: 3.28 MIL/uL — ABNORMAL LOW (ref 3.87–5.11)
RDW: 13.3 % (ref 11.5–15.5)
WBC: 8.3 10*3/uL (ref 4.0–10.5)

## 2013-08-30 LAB — TYPE AND SCREEN
ABO/RH(D): O POS
ANTIBODY SCREEN: NEGATIVE

## 2013-08-30 LAB — RPR: RPR Ser Ql: NONREACTIVE

## 2013-08-30 LAB — ABO/RH: ABO/RH(D): O POS

## 2013-08-30 SURGERY — Surgical Case
Anesthesia: Spinal | Site: Abdomen

## 2013-08-30 MED ORDER — OXYTOCIN 10 UNIT/ML IJ SOLN
INTRAMUSCULAR | Status: AC
  Filled 2013-08-30: qty 4

## 2013-08-30 MED ORDER — DIPHENHYDRAMINE HCL 50 MG/ML IJ SOLN: 25.0000 mg | INTRAMUSCULAR | Status: DC | PRN

## 2013-08-30 MED ORDER — FENTANYL CITRATE 0.05 MG/ML IJ SOLN
INTRAMUSCULAR | Status: DC | PRN
  Administered 2013-08-30: 15 ug via INTRATHECAL

## 2013-08-30 MED ORDER — SODIUM CHLORIDE 0.9 % IJ SOLN: 3.0000 mL | Freq: Two times a day (BID) | INTRAMUSCULAR | Status: DC

## 2013-08-30 MED ORDER — SCOPOLAMINE 1 MG/3DAYS TD PT72
1.0000 | MEDICATED_PATCH | Freq: Once | TRANSDERMAL | Status: AC
  Administered 2013-08-30: 1.5 mg via TRANSDERMAL

## 2013-08-30 MED ORDER — MEPERIDINE HCL 25 MG/ML IJ SOLN: 6.2500 mg | INTRAMUSCULAR | Status: DC | PRN

## 2013-08-30 MED ORDER — PHENYLEPHRINE 8 MG IN D5W 100 ML (0.08MG/ML) PREMIX OPTIME
INJECTION | INTRAVENOUS | Status: DC | PRN
  Administered 2013-08-30: 60 ug/min via INTRAVENOUS

## 2013-08-30 MED ORDER — KETOROLAC TROMETHAMINE 30 MG/ML IJ SOLN
30.0000 mg | Freq: Four times a day (QID) | INTRAMUSCULAR | Status: AC | PRN
  Administered 2013-08-30: 30 mg via INTRAVENOUS
  Filled 2013-08-30: qty 1

## 2013-08-30 MED ORDER — SIMETHICONE 80 MG PO CHEW
80.0000 mg | CHEWABLE_TABLET | Freq: Three times a day (TID) | ORAL | Status: DC
  Administered 2013-08-30 – 2013-09-02 (×8): 80 mg via ORAL
  Filled 2013-08-30 (×9): qty 1

## 2013-08-30 MED ORDER — DIPHENHYDRAMINE HCL 50 MG/ML IJ SOLN: 12.5000 mg | INTRAMUSCULAR | Status: DC | PRN

## 2013-08-30 MED ORDER — OXYCODONE-ACETAMINOPHEN 5-325 MG PO TABS
1.0000 | ORAL_TABLET | ORAL | Status: DC | PRN
  Administered 2013-08-31: 2 via ORAL
  Administered 2013-08-31 (×2): 1 via ORAL
  Administered 2013-08-31: 2 via ORAL
  Administered 2013-09-01: 1 via ORAL
  Administered 2013-09-01: 2 via ORAL
  Administered 2013-09-01: 1 via ORAL
  Administered 2013-09-01 – 2013-09-02 (×4): 2 via ORAL
  Filled 2013-08-30 (×3): qty 2
  Filled 2013-08-30: qty 1
  Filled 2013-08-30: qty 2
  Filled 2013-08-30 (×2): qty 1
  Filled 2013-08-30: qty 2
  Filled 2013-08-30: qty 1
  Filled 2013-08-30 (×2): qty 2

## 2013-08-30 MED ORDER — SIMETHICONE 80 MG PO CHEW: 80.0000 mg | CHEWABLE_TABLET | ORAL | Status: DC | PRN

## 2013-08-30 MED ORDER — MORPHINE SULFATE (PF) 0.5 MG/ML IJ SOLN
INTRAMUSCULAR | Status: DC | PRN
  Administered 2013-08-30: .1 mg via INTRATHECAL

## 2013-08-30 MED ORDER — VALACYCLOVIR HCL 500 MG PO TABS
1000.0000 mg | ORAL_TABLET | Freq: Two times a day (BID) | ORAL | Status: AC
  Administered 2013-08-30 – 2013-09-01 (×6): 1000 mg via ORAL
  Filled 2013-08-30 (×6): qty 2

## 2013-08-30 MED ORDER — OXYTOCIN 40 UNITS IN LACTATED RINGERS INFUSION - SIMPLE MED
62.5000 mL/h | INTRAVENOUS | Status: AC
Start: 2013-08-30 — End: 2013-08-31

## 2013-08-30 MED ORDER — MENTHOL 3 MG MT LOZG: 1.0000 | LOZENGE | OROMUCOSAL | Status: DC | PRN

## 2013-08-30 MED ORDER — DIPHENHYDRAMINE HCL 25 MG PO CAPS: 25.0000 mg | ORAL_CAPSULE | Freq: Four times a day (QID) | ORAL | Status: DC | PRN

## 2013-08-30 MED ORDER — BUPIVACAINE IN DEXTROSE 0.75-8.25 % IT SOLN
INTRATHECAL | Status: DC | PRN
  Administered 2013-08-30: 1.3 mL via INTRATHECAL

## 2013-08-30 MED ORDER — SIMETHICONE 80 MG PO CHEW
80.0000 mg | CHEWABLE_TABLET | ORAL | Status: DC
  Administered 2013-08-31 – 2013-09-02 (×2): 80 mg via ORAL
  Filled 2013-08-30 (×3): qty 1

## 2013-08-30 MED ORDER — NALOXONE HCL 1 MG/ML IJ SOLN
1.0000 ug/kg/h | INTRAVENOUS | Status: DC | PRN
  Filled 2013-08-30: qty 2

## 2013-08-30 MED ORDER — HYDROCHLOROTHIAZIDE 50 MG PO TABS
50.0000 mg | ORAL_TABLET | Freq: Every day | ORAL | Status: DC
  Administered 2013-08-30 – 2013-09-02 (×4): 50 mg via ORAL
  Filled 2013-08-30 (×5): qty 1

## 2013-08-30 MED ORDER — SENNOSIDES-DOCUSATE SODIUM 8.6-50 MG PO TABS
2.0000 | ORAL_TABLET | ORAL | Status: DC
  Administered 2013-08-30 – 2013-09-02 (×3): 2 via ORAL
  Filled 2013-08-30 (×3): qty 2

## 2013-08-30 MED ORDER — ZOLPIDEM TARTRATE 5 MG PO TABS: 5.0000 mg | ORAL_TABLET | Freq: Every evening | ORAL | Status: DC | PRN

## 2013-08-30 MED ORDER — PRENATAL MULTIVITAMIN CH
1.0000 | ORAL_TABLET | Freq: Every day | ORAL | Status: DC
  Administered 2013-08-31 – 2013-09-02 (×3): 1 via ORAL
  Filled 2013-08-30 (×3): qty 1

## 2013-08-30 MED ORDER — DIBUCAINE 1 % RE OINT: 1.0000 "application " | TOPICAL_OINTMENT | RECTAL | Status: DC | PRN

## 2013-08-30 MED ORDER — SCOPOLAMINE 1 MG/3DAYS TD PT72
MEDICATED_PATCH | TRANSDERMAL | Status: AC
  Administered 2013-08-30: 1.5 mg via TRANSDERMAL
  Filled 2013-08-30: qty 1

## 2013-08-30 MED ORDER — NALOXONE HCL 0.4 MG/ML IJ SOLN: 0.4000 mg | INTRAMUSCULAR | Status: DC | PRN

## 2013-08-30 MED ORDER — SODIUM CHLORIDE 0.9 % IJ SOLN: 3.0000 mL | INTRAMUSCULAR | Status: DC | PRN

## 2013-08-30 MED ORDER — CEFAZOLIN SODIUM-DEXTROSE 2-3 GM-% IV SOLR
INTRAVENOUS | Status: DC | PRN
  Administered 2013-08-30: 2 g via INTRAVENOUS

## 2013-08-30 MED ORDER — METOCLOPRAMIDE HCL 5 MG/ML IJ SOLN: 10.0000 mg | Freq: Once | INTRAMUSCULAR | Status: DC | PRN

## 2013-08-30 MED ORDER — IBUPROFEN 600 MG PO TABS
600.0000 mg | ORAL_TABLET | Freq: Four times a day (QID) | ORAL | Status: DC
  Administered 2013-08-30 – 2013-09-02 (×11): 600 mg via ORAL
  Filled 2013-08-30 (×11): qty 1

## 2013-08-30 MED ORDER — ONDANSETRON HCL 4 MG PO TABS: 4.0000 mg | ORAL_TABLET | ORAL | Status: DC | PRN

## 2013-08-30 MED ORDER — KETOROLAC TROMETHAMINE 60 MG/2ML IM SOLN
INTRAMUSCULAR | Status: AC
  Administered 2013-08-30: 60 mg via INTRAMUSCULAR
  Filled 2013-08-30: qty 2

## 2013-08-30 MED ORDER — PHENYLEPHRINE 8 MG IN D5W 100 ML (0.08MG/ML) PREMIX OPTIME
INJECTION | INTRAVENOUS | Status: AC
  Filled 2013-08-30: qty 100

## 2013-08-30 MED ORDER — SODIUM CHLORIDE 0.9 % IJ SOLN
3.0000 mL | INTRAMUSCULAR | Status: DC | PRN
Start: 2013-08-30 — End: 2013-09-02

## 2013-08-30 MED ORDER — BISACODYL 10 MG RE SUPP: 10.0000 mg | Freq: Every day | RECTAL | Status: DC | PRN

## 2013-08-30 MED ORDER — KETOROLAC TROMETHAMINE 60 MG/2ML IM SOLN
60.0000 mg | Freq: Once | INTRAMUSCULAR | Status: AC | PRN
  Administered 2013-08-30: 60 mg via INTRAMUSCULAR

## 2013-08-30 MED ORDER — NALBUPHINE HCL 10 MG/ML IJ SOLN: 5.0000 mg | INTRAMUSCULAR | Status: DC | PRN

## 2013-08-30 MED ORDER — KETOROLAC TROMETHAMINE 30 MG/ML IJ SOLN: 30.0000 mg | Freq: Four times a day (QID) | INTRAMUSCULAR | Status: AC | PRN

## 2013-08-30 MED ORDER — LANOLIN HYDROUS EX OINT: 1.0000 "application " | TOPICAL_OINTMENT | CUTANEOUS | Status: DC | PRN

## 2013-08-30 MED ORDER — LACTATED RINGERS IV SOLN
INTRAVENOUS | Status: DC | PRN
  Administered 2013-08-30: 10:00:00 via INTRAVENOUS

## 2013-08-30 MED ORDER — FERROUS SULFATE 325 (65 FE) MG PO TABS
325.0000 mg | ORAL_TABLET | Freq: Two times a day (BID) | ORAL | Status: DC
  Administered 2013-08-31 – 2013-09-02 (×5): 325 mg via ORAL
  Filled 2013-08-30 (×5): qty 1

## 2013-08-30 MED ORDER — FENTANYL CITRATE 0.05 MG/ML IJ SOLN
25.0000 ug | INTRAMUSCULAR | Status: DC | PRN
  Administered 2013-08-30 (×2): 50 ug via INTRAVENOUS

## 2013-08-30 MED ORDER — DEXTROSE IN LACTATED RINGERS 5 % IV SOLN: INTRAVENOUS | Status: DC

## 2013-08-30 MED ORDER — MISOPROSTOL 200 MCG PO TABS
ORAL_TABLET | ORAL | Status: AC
  Filled 2013-08-30: qty 5

## 2013-08-30 MED ORDER — FENTANYL CITRATE 0.05 MG/ML IJ SOLN
INTRAMUSCULAR | Status: AC
  Filled 2013-08-30: qty 2

## 2013-08-30 MED ORDER — ONDANSETRON HCL 4 MG/2ML IJ SOLN
INTRAMUSCULAR | Status: DC | PRN
  Administered 2013-08-30: 4 mg via INTRAVENOUS

## 2013-08-30 MED ORDER — FENTANYL CITRATE 0.05 MG/ML IJ SOLN
INTRAMUSCULAR | Status: AC
  Administered 2013-08-30: 50 ug via INTRAVENOUS
  Filled 2013-08-30: qty 2

## 2013-08-30 MED ORDER — EPHEDRINE 5 MG/ML INJ
INTRAVENOUS | Status: AC
  Filled 2013-08-30: qty 10

## 2013-08-30 MED ORDER — MORPHINE SULFATE 0.5 MG/ML IJ SOLN
INTRAMUSCULAR | Status: AC
  Filled 2013-08-30: qty 10

## 2013-08-30 MED ORDER — OXYTOCIN 10 UNIT/ML IJ SOLN
40.0000 [IU] | INTRAVENOUS | Status: DC | PRN
  Administered 2013-08-30: 40 [IU] via INTRAVENOUS

## 2013-08-30 MED ORDER — FLUTICASONE PROPIONATE 50 MCG/ACT NA SUSP
2.0000 | Freq: Two times a day (BID) | NASAL | Status: DC
  Administered 2013-08-30 – 2013-09-01 (×2): 2 via NASAL
  Filled 2013-08-30: qty 16

## 2013-08-30 MED ORDER — ONDANSETRON HCL 4 MG/2ML IJ SOLN: 4.0000 mg | INTRAMUSCULAR | Status: DC | PRN

## 2013-08-30 MED ORDER — WITCH HAZEL-GLYCERIN EX PADS: 1.0000 "application " | MEDICATED_PAD | CUTANEOUS | Status: DC | PRN

## 2013-08-30 MED ORDER — EPHEDRINE SULFATE 50 MG/ML IJ SOLN
INTRAMUSCULAR | Status: DC | PRN
  Administered 2013-08-30 (×2): 5 mg via INTRAVENOUS

## 2013-08-30 MED ORDER — BUPIVACAINE HCL (PF) 0.25 % IJ SOLN
INTRAMUSCULAR | Status: DC | PRN
  Administered 2013-08-30: 9 mL

## 2013-08-30 MED ORDER — FLEET ENEMA 7-19 GM/118ML RE ENEM: 1.0000 | ENEMA | Freq: Every day | RECTAL | Status: DC | PRN

## 2013-08-30 MED ORDER — SODIUM CHLORIDE 0.9 % IV SOLN: 250.0000 mL | INTRAVENOUS | Status: DC

## 2013-08-30 MED ORDER — DIPHENHYDRAMINE HCL 25 MG PO CAPS
25.0000 mg | ORAL_CAPSULE | ORAL | Status: DC | PRN
  Filled 2013-08-30: qty 1

## 2013-08-30 MED ORDER — METOCLOPRAMIDE HCL 5 MG/ML IJ SOLN: 10.0000 mg | Freq: Three times a day (TID) | INTRAMUSCULAR | Status: DC | PRN

## 2013-08-30 MED ORDER — ONDANSETRON HCL 4 MG/2ML IJ SOLN: 4.0000 mg | Freq: Three times a day (TID) | INTRAMUSCULAR | Status: DC | PRN

## 2013-08-30 SURGICAL SUPPLY — 43 items
BARRIER ADHS 3X4 INTERCEED (GAUZE/BANDAGES/DRESSINGS) ×2 IMPLANT
BENZOIN TINCTURE PRP APPL 2/3 (GAUZE/BANDAGES/DRESSINGS) IMPLANT
CLAMP CORD UMBIL (MISCELLANEOUS) IMPLANT
CLOTH BEACON ORANGE TIMEOUT ST (SAFETY) ×2 IMPLANT
CONTAINER PREFILL 10% NBF 15ML (MISCELLANEOUS) IMPLANT
DRAPE LG THREE QUARTER DISP (DRAPES) IMPLANT
DRESSING TELFA 8X3 (GAUZE/BANDAGES/DRESSINGS) ×2 IMPLANT
DRSG OPSITE POSTOP 4X10 (GAUZE/BANDAGES/DRESSINGS) ×2 IMPLANT
DURAPREP 26ML APPLICATOR (WOUND CARE) ×2 IMPLANT
ELECT REM PT RETURN 9FT ADLT (ELECTROSURGICAL) ×2
ELECTRODE REM PT RTRN 9FT ADLT (ELECTROSURGICAL) ×1 IMPLANT
EXTRACTOR VACUUM M CUP 4 TUBE (SUCTIONS) IMPLANT
GLOVE BIOGEL PI IND STRL 7.0 (GLOVE) ×1 IMPLANT
GLOVE BIOGEL PI INDICATOR 7.0 (GLOVE) ×1
GLOVE ECLIPSE 6.5 STRL STRAW (GLOVE) ×2 IMPLANT
GOWN STRL REUS W/TWL LRG LVL3 (GOWN DISPOSABLE) ×4 IMPLANT
KIT ABG SYR 3ML LUER SLIP (SYRINGE) IMPLANT
NEEDLE HYPO 25X1 1.5 SAFETY (NEEDLE) ×2 IMPLANT
NEEDLE HYPO 25X5/8 SAFETYGLIDE (NEEDLE) IMPLANT
NS IRRIG 1000ML POUR BTL (IV SOLUTION) ×2 IMPLANT
PACK C SECTION WH (CUSTOM PROCEDURE TRAY) ×2 IMPLANT
PAD OB MATERNITY 4.3X12.25 (PERSONAL CARE ITEMS) ×2 IMPLANT
RTRCTR C-SECT PINK 25CM LRG (MISCELLANEOUS) IMPLANT
SPONGE GAUZE 4X4 12PLY (GAUZE/BANDAGES/DRESSINGS) ×2 IMPLANT
STAPLER VISISTAT 35W (STAPLE) IMPLANT
STRIP CLOSURE SKIN 1/2X4 (GAUZE/BANDAGES/DRESSINGS) IMPLANT
SUT CHROMIC GUT AB #0 18 (SUTURE) IMPLANT
SUT MNCRL 0 VIOLET CTX 36 (SUTURE) ×3 IMPLANT
SUT MON AB 4-0 PS1 27 (SUTURE) IMPLANT
SUT MONOCRYL 0 CTX 36 (SUTURE) ×3
SUT PLAIN 2 0 (SUTURE)
SUT PLAIN 2 0 XLH (SUTURE) IMPLANT
SUT PLAIN ABS 2-0 CT1 27XMFL (SUTURE) IMPLANT
SUT VIC AB 0 CT1 36 (SUTURE) ×4 IMPLANT
SUT VIC AB 2-0 CT1 27 (SUTURE) ×1
SUT VIC AB 2-0 CT1 TAPERPNT 27 (SUTURE) ×1 IMPLANT
SUT VIC AB 4-0 PS2 27 (SUTURE) IMPLANT
SYR CONTROL 10ML LL (SYRINGE) ×2 IMPLANT
TAPE PAPER 2X10 WHT MICROPORE (GAUZE/BANDAGES/DRESSINGS) ×2 IMPLANT
TOWEL OR 17X24 6PK STRL BLUE (TOWEL DISPOSABLE) ×2 IMPLANT
TRAY FOLEY BAG SILVER LF 14FR (CATHETERS) ×2 IMPLANT
TRAY FOLEY CATH 14FR (SET/KITS/TRAYS/PACK) IMPLANT
WATER STERILE IRR 1000ML POUR (IV SOLUTION) ×2 IMPLANT

## 2013-08-30 NOTE — Op Note (Signed)
NAME:  Denise [name], Denise [name] NO.:  [id number]  MEDICAL RECORD NO.:  [id number]  LOCATION:  [facility]                          FACILITY:  [facility]  PHYSICIAN:  Corporate treasurer, M.D.DATE OF BIRTH:  [birth year]  DATE OF PROCEDURE:  08/30/2013 DATE OF DISCHARGE:                              OPERATIVE REPORT   PREOPERATIVE DIAGNOSES:  Pregnancy-induced hypertension, active herpes infection, intrauterine gestation at 37-1/7 weeks.  PROCEDURE:  Primary cesarean section, Kerr hysterotomy.  POSTOP DIAGNOSES:  Pregnancy-induced hypertension, active herpes infection, intrauterine gestation at 37-1/7 weeks.  ANESTHESIA:  Spinal.  SURGEON:  Servando Salina, MD  ASSISTANT:  Artelia Laroche, CNM  DESCRIPTION OF PROCEDURE:  Under adequate spinal anesthesia, the patient was placed in the supine position with a left lateral tilt.  She was sterilely prepped and draped in usual fashion.  An indwelling Foley catheter was sterilely placed.  A 0.25% Marcaine was injected along the planned Pfannenstiel skin incision site.  Pfannenstiel skin incision was then made, carried down to the rectus fascia.  Rectus fascia was opened transversely.  The rectus fascia was then bluntly and sharply dissected off the rectus muscle in superior and inferior fashion.  The rectus muscle was split in midline.  The parietal peritoneum entered sharply and extended.  The vesicouterine peritoneum was opened transversely. The bladder was bluntly dissected off the lower uterine segment and displaced inferiorly with a bladder retractor.  A curvilinear low transverse uterine incision was then made and extended with bandage scissors.  Artificial rupture of membranes occurred.  Light meconium fluid was noted.  Subsequent delivery of a live female from the left occiput transverse position with cord around the neck x2, reducible, was delivered.  Bulb suctioned on the abdomen.  Baby was transferred to the awaiting pediatrician  who assigned Apgars of 8 and 9 at 1 and 5 minutes. The placenta which was anterior was manually removed.  Uterine cavity was cleaned of debris.  Uterine incision had no extension.  Uterine incision was closed in 2 layers, the first layer with 0 Monocryl running locked stitch, second layer with imbricating using 0 Monocryl suture. Normal tubes and ovaries were noted bilaterally.  A small subserosal 1.5 cm posterior fibroid was noted.  The abdomen was copiously irrigated and suctioned of debris.  The paracolic gutters were cleaned of debris. Uterine incision was inspected.  Small bleeders cauterized.  Interceed placed over the lower uterine segment incision.  The parietal peritoneum was then closed with 2-0 Vicryl.  The rectus fascia was closed with 0 Vicryl x2.  The subcutaneous areas were irrigated, small bleeders cauterized.  Interrupted 2-0 plain sutures, 2-0 plain sutures placed and the skin approximated with Ethicon staples.  SPECIMEN:  Placenta not sent to Pathology.  ESTIMATED BLOOD LOSS:  800 mL.  INTRAOPERATIVE FLUID:  2400 mL.  URINE OUTPUT:  100 mL.  Sponge and instrument counts x2 was correct.  COMPLICATION:  None.  The patient tolerated procedure well, was transferred to recovery in stable condition.     Servando Salina, M.D.     [place]/MEDQ  D:  08/30/2013  T:  08/30/2013  Job:  [id number]

## 2013-08-30 NOTE — Brief Op Note (Signed)
08/29/2013 - 08/30/2013  10:15 AM  PATIENT:  Denise [name]  38 y.o. female  PRE-OPERATIVE DIAGNOSIS:  Worsening Pregnancy Induced Hypertension, Active Herpes Lesion. IUP @ 37 1/7 weeks  POST-OPERATIVE DIAGNOSIS:  , Worsening Pregnancy Induced Hypertension, Active Herpes Lesion, Uterine fibroid. IUP @ 37 1/7 weeks  PROCEDURE:  Primary cesarean section, kerr hysterotomy  SURGEON:  Surgeon(s) and Role:    * Jyquan Kenley A Beatrix Breece, MD - Primary  PHYSICIAN ASSISTANT:   ASSISTANTS: Artelia Laroche, CNM   ANESTHESIA:   spinal  FINDINGS: live female  LOT ,CAN x 2,  light meconium, Apgar 8/9, nl placenta( ant), nl tubes and ovaries, posterior 1.5-2 cm SS fibroid  EBL:  Total I/O In: 2200 [I.V.:2200] Out: 900 [Urine:100; Blood:800]  BLOOD ADMINISTERED:none  DRAINS: none   LOCAL MEDICATIONS USED:  MARCAINE     SPECIMEN:  Source of Specimen:  placenta not sent  DISPOSITION OF SPECIMEN:  N/A  COUNTS:  YES  TOURNIQUET:  * No tourniquets in log *  DICTATION: .Other Dictation: Dictation Number [id number]  PLAN OF CARE: Admit to inpatient   PATIENT DISPOSITION:  PACU - hemodynamically stable.   Delay start of Pharmacological VTE agent (>24hrs) due to surgical blood loss or risk of bleeding: no

## 2013-08-30 NOTE — Anesthesia Preprocedure Evaluation (Signed)
Anesthesia Evaluation  Patient identified by MRN, date of birth, ID band Patient awake    Reviewed: Allergy & Precautions, H&P , NPO status , Patient's Chart, lab work & pertinent test results, reviewed documented beta blocker date and time   History of Anesthesia Complications Negative for: history of anesthetic complications  Airway       Dental   Pulmonary neg pulmonary ROS,          Cardiovascular hypertension (PIH), On Home Beta Blockers     Neuro/Psych  Headaches, negative psych ROS   GI/Hepatic Neg liver ROS, GERD-  Medicated,IBS   Endo/Other  negative endocrine ROSBMI 34  Renal/GU negative Renal ROS Bladder dysfunction (IC)      Musculoskeletal   Abdominal   Peds  Hematology  (+) anemia ,   Anesthesia Other Findings   Reproductive/Obstetrics (+) Pregnancy (PIH, active herpes)                           Anesthesia Physical Anesthesia Plan  ASA: II  Anesthesia Plan: Spinal   Post-op Pain Management:    Induction:   Airway Management Planned:   Additional Equipment:   Intra-op Plan:   Post-operative Plan:   Informed Consent: I have reviewed the patients History and Physical, chart, labs and discussed the procedure including the risks, benefits and alternatives for the proposed anesthesia with the patient or authorized representative who has indicated his/her understanding and acceptance.     Plan Discussed with: Surgeon and CRNA  Anesthesia Plan Comments:         Anesthesia Quick Evaluation

## 2013-08-30 NOTE — Addendum Note (Signed)
Addendum created 08/30/13 1524 by Assunta Gambles, MD   Modules edited: Notes Section   Notes Section:  File: [id number]

## 2013-08-30 NOTE — Anesthesia Procedure Notes (Signed)
Spinal  Patient location during procedure: OR Start time: 08/30/2013 9:18 AM Staffing Performed by: anesthesiologist  Preanesthetic Checklist Completed: patient identified, site marked, surgical consent, pre-op evaluation, timeout performed, IV checked, risks and benefits discussed and monitors and equipment checked Spinal Block Patient position: sitting Prep: site prepped and draped and DuraPrep Patient monitoring: heart rate, cardiac monitor, continuous pulse ox and blood pressure Approach: midline Location: L3-4 Injection technique: single-shot Needle Needle type: Pencan  Needle gauge: 24 G Needle length: 9 cm Assessment Sensory level: T4 Additional Notes Clear free flow CSF on first attempt.  No paresthesia.  Patient tolerated procedure well with no apparent complications. Charlton Haws, MD

## 2013-08-30 NOTE — Transfer of Care (Signed)
Immediate Anesthesia Transfer of Care Note  Patient: Denise [name]  Procedure(s) Performed: Procedure(s): CESAREAN SECTION (N/A)  Patient Location: PACU  Anesthesia Type:Spinal  Level of Consciousness: awake, alert , oriented and patient cooperative  Airway & Oxygen Therapy: Patient Spontanous Breathing  Post-op Assessment: Report given to PACU RN and Post -op Vital signs reviewed and stable  Post vital signs: Reviewed and stable  Complications: No apparent anesthesia complications

## 2013-08-30 NOTE — Anesthesia Postprocedure Evaluation (Addendum)
  Anesthesia Post-op Note  Anesthesia Post Note  Patient: Denise [name]  Procedure(s) Performed: Procedure(s) (LRB): CESAREAN SECTION (N/A)  Anesthesia type: Spinal  Patient location: PACU  Post pain: Pain level controlled  Post assessment: Post-op Vital signs reviewed  Post vital signs: stable  Level of consciousness: awake  Complications: No apparent anesthesia complications

## 2013-08-30 NOTE — Progress Notes (Signed)
S: Denies h/a, visual change  O:  VS: BP 136-166/71-115 lungs clear to A Cor RRR Abdomen: gravid Pelvic: deferred Extr: 2+ edema  Tracing:  Baseline 135 (+) accel irreg ctx CBC    Component Value Date/Time   WBC 8.3 08/30/2013 0658   RBC 3.28* 08/30/2013 0658   HGB 9.9* 08/30/2013 0658   HCT 27.2* 08/30/2013 0658   PLT 153 08/30/2013 0658   MCV 82.9 08/30/2013 0658   MCH 30.2 08/30/2013 0658   MCHC 36.4* 08/30/2013 0658   RDW 13.3 08/30/2013 0658   LYMPHSABS 2.3 08/30/2013 0658   MONOABS 0.9 08/30/2013 0658   EOSABS 0.0 08/30/2013 0658   BASOSABS 0.0 08/30/2013 0658     IMP: Severe PIH on Labetalol IUP @ 37 17 weeks Active HSV P) Primary C/S

## 2013-08-31 ENCOUNTER — Encounter (HOSPITAL_COMMUNITY): Payer: Self-pay | Admitting: Obstetrics and Gynecology

## 2013-08-31 LAB — CBC
HEMATOCRIT: 21.7 % — AB (ref 36.0–46.0)
HEMOGLOBIN: 7.9 g/dL — AB (ref 12.0–15.0)
MCH: 30.3 pg (ref 26.0–34.0)
MCHC: 36.4 g/dL — ABNORMAL HIGH (ref 30.0–36.0)
MCV: 83.1 fL (ref 78.0–100.0)
Platelets: 133 10*3/uL — ABNORMAL LOW (ref 150–400)
RBC: 2.61 MIL/uL — AB (ref 3.87–5.11)
RDW: 13.5 % (ref 11.5–15.5)
WBC: 11.4 10*3/uL — AB (ref 4.0–10.5)

## 2013-08-31 NOTE — Lactation Note (Signed)
This note was copied from the chart of [place]. Lactation Consultation Note  Patient Name: Denise [name] Date: 08/31/2013 Reason for consult: Initial assessment;Hyperbilirubinemia;Late preterm infant but baby over 6 pounds and mom is using DEBP and feeding both at breast and with bottle (ebm or formula) due to triple phototx.  RN staff had initiated DEBP but this mom and baby had not yet received [place] visit.  Mom states she is offering breast as able and also using DEBP and feeding formula by bottle.  Most recent LATCH score=9 per RN assessment.  LC encouraged STS and cue feedings at breast and supplement as needed, with importance of pumping 15 minutes every time supplement given and offer ebm as available. LC encouraged review of Baby and Me pp 9, 14 and 20-25 for STS and BF information. LC provided [employer] brochure and reviewed [facility] services and list of community and web site resources.     Maternal Data Formula Feeding for Exclusion: No Infant to breast within first hour of birth: No Breastfeeding delayed due to:: Infant status Has patient been taught Hand Expression?:  (using DEBP but hand expression teaching not documented) Does the patient have breastfeeding experience prior to this delivery?: No  Feeding Feeding Type: Breast Fed Length of feed: 15 min  LATCH Score/Interventions            Most recent LATCH score=9 per RN assessment          Lactation Tools Discussed/Used DEBP at bedside Initiated by:: nursing staff initiated Date initiated:: 08/31/13   Consult Status Consult Status: Follow-up Date: 09/01/13 Follow-up type: In-patient    Denise [name] [facility] 08/31/2013, 9:21 PM

## 2013-08-31 NOTE — Addendum Note (Signed)
Addendum created 08/31/13 1039 by Lenox Ponds, CRNA   Modules edited: Notes Section   Notes Section:  File: [id number]

## 2013-08-31 NOTE — Progress Notes (Signed)
Patient ID: Denise [name], female   DOB: [birth year], 38 y.o.   MRN: [id number] Subjective: POD# 1 Information for the patient's newborn:  Gabryela, Kimbrell [[id number]]  female  / circ planning  Reports feeling well Feeding: breast Patient reports tolerating PO.  Breast symptoms: none Pain controlled with ibuprofen (OTC) and narcotic analgesics including Percocet Denies HA/SOB/C/P/N/V/dizziness. Flatus none. She reports vaginal bleeding as normal, without clots.  She is ambulating, urinating without difficult.     Objective:   VS:  Filed Vitals:   08/30/13 2000 08/30/13 2340 08/31/13 0400 08/31/13 0815  BP: 128/78 126/70 130/78 127/77  Pulse: 84 89 84 76  Temp: 98 F (36.7 C) 98.3 F (36.8 C) 97.6 F (36.4 C) 98 F (36.7 C)  TempSrc: Oral Oral Oral Oral  Resp: 20 20 20 18   Height:      Weight:      SpO2: 100% 99% 98% 98%     Intake/Output Summary (Last 24 hours) at 08/31/13 0941 Last data filed at 08/31/13 0430  Gross per 24 hour  Intake   2930 ml  Output   1750 ml  Net   1180 ml        Recent Labs  08/30/13 0658 08/31/13 0558  WBC 8.3 11.4*  HGB 9.9* 7.9*  HCT 27.2* 21.7*  PLT 153 133*     Blood type: --/--/O POS, O POS (04/04 2110)  Rubella: Immune (09/15 0000)     Physical Exam:  General: alert, cooperative, no distress and moderately obese CV: Regular rate and rhythm, S1S2 present or without murmur or extra heart sounds Resp: clear Abdomen: soft, nontender, normal bowel sounds Incision: pressure dressing intact, no drainage Uterine Fundus: firm, below umbilicus, nontender Lochia: minimal Ext: extremities normal, atraumatic, 1+ edema bilateral LE, no cyanosis and Homans sign is negative, no sign of DVT      Assessment/Plan: 38 y.o.   POD# 1.  s/p Cesarean Delivery.  Indications: active HSV lesion                Principal Problem:   Postpartum care following cesarean delivery (4/5) Active Problems:   PIH (pregnancy induced hypertension)  Doing  well, stable.               Regular diet as tolerated Ambulate Routine post-op care Remove pressure dressing in shower today  Graceann Congress, MSN, CNM 08/31/2013, 9:41 AM

## 2013-08-31 NOTE — Anesthesia Postprocedure Evaluation (Signed)
  Anesthesia Post-op Note  Patient: Denise [name]  Procedure(s) Performed: Procedure(s): CESAREAN SECTION (N/A)  Patient Location: Mother/Baby  Anesthesia Type:Spinal  Level of Consciousness: awake, alert  and oriented  Airway and Oxygen Therapy: Patient Spontanous Breathing  Post-op Pain: mild  Post-op Assessment: Patient's Cardiovascular Status Stable, Respiratory Function Stable, Patent Airway, No signs of Nausea or vomiting, Pain level controlled, No headache, No backache, No residual numbness and No residual motor weakness  Post-op Vital Signs: stable  Complications: No apparent anesthesia complications

## 2013-09-01 NOTE — Lactation Note (Signed)
This note was copied from the chart of [place]. Lactation Consultation Note  Patient Name: Denise [name] Date: 09/01/2013 Reason for consult: Follow-up assessment;Hyperbilirubinemia;Late preterm infant  Baby on double photo therapy, was on triple, mom enc to offer breast first then supplement per doctor's orders 15-20 ml of formula. Enc mom to feed with cues and to call out for assistance as needed. Discussed what to look for with a good latch. Mom able to hand express and colostrum present. Enc mom to looks for feeding cues and to hold baby if baby sleep for longer than 2.5 - 3 hours to elicit cues as baby may be drowsy and need to be awakened to show feeding cues.   Maternal Data    Feeding Feeding Type: Breast Fed (LC witiness first 15 minutes of BF.)  LATCH Score/Interventions Latch: Grasps breast easily, tongue down, lips flanged, rhythmical sucking.  Audible Swallowing: A few with stimulation Intervention(s): Skin to skin;Hand expression  Type of Nipple: Everted at rest and after stimulation  Comfort (Breast/Nipple): Soft / non-tender     Hold (Positioning): Assistance needed to correctly position infant at breast and maintain latch. Intervention(s): Breastfeeding basics reviewed;Support Pillows;Position options  LATCH Score: 8  Lactation Tools Discussed/Used     Consult Status Consult Status: Follow-up Follow-up type: In-patient    [name] 09/01/2013, 4:01 PM

## 2013-09-01 NOTE — Progress Notes (Signed)
POD # 2  Subjective: Pt reports feeling well/ Pain controlled with Motrin and Percocet No HA, visual disturbances, or epigastric pain Tolerating po/Voiding without problems/ No n/v/ Flatus present Activity: ad lib Bleeding is light Newborn info:  Information for the patient's newborn:  Cruzita, Lipa [[id number]]  female  / Circumcision: done/ Feeding: breast   Objective: VS:  Filed Vitals:   08/31/13 1729 08/31/13 2347 09/01/13 0529 09/01/13 1019  BP: 138/75 128/85 129/75 131/66  Pulse: 82 79 103 99  Temp: 97.9 F (36.6 C)  97.6 F (36.4 C)   TempSrc: Oral  Oral   Resp: 18  18   Height:      Weight:      SpO2:    99%    I&O: Intake/Output     04/06 0701 - 04/07 0700 04/07 0701 - 04/08 0700   P.O.     I.V. (mL/kg)     Total Intake(mL/kg)     Urine (mL/kg/hr) 1350 (0.6)    Blood     Total Output 1350     Net -1350            LABS:  Recent Labs  08/30/13 0658 08/31/13 0558  WBC 8.3 11.4*  HGB 9.9* 7.9*  HCT 27.2* 21.7*  PLT 153 133*    Blood type: --/--/O POS, O POS (04/04 2110) Rubella: Immune (09/15 0000)     Physical Exam:  General: alert and cooperative CV: Regular rate and rhythm Resp: CTA bilaterally Abdomen: soft, nontender, normal bowel sounds Uterine Fundus: firm, below umbilicus, nontender Incision: Covered with Tegaderm and honeycomb dressing; no significant drainage; well approximated. Lochia: minimal Ext: edema 2+ BLE and Homans sign is negative, no sign of DVT    Assessment/: POD # 2/ G1P1001/ S/P C/Section d/t active HSV IDA with compounding ABL anemia PIH, delivered Doing well  Plan: Continue routine post op orders Continue Labetalol and HCTZ Anticipate discharge home tomorrow   Signed: Julianne Handler, Delane Ginger, MSN, CNM 09/01/2013, 10:40 AM

## 2013-09-01 NOTE — Lactation Note (Cosign Needed)
This note was copied from the chart of [place]. Lactation Consultation Note  Patient Name: Boy Sonnet Rizor [id number] Date: 09/01/2013 Reason for consult: Follow-up assessment;Hyperbilirubinemia;Late preterm infant Follow-up with mom with engorged breast. Mom has been applying heat to breast. Assisted mom to use DEBP and massaging hardened areas in outer aspects of her breast just to soften breast. Mom is able to removed EBM and soften areas. Enc mom to only EBM for comfort not to increase amount of production and to put baby to breasts for nursing with cues. Baby was circumcised this afternoon and has been sleepy, but did nurse once per mom. Enc mom to supplement with EBM with bottle since this is mother's preferred method for supplementation. Enc mom not to let BM sit in breasts and to keep pumping for comfort and nursing through the night and applying ice in between. Enc mom to call out for assistance as needed.  Maternal Data    Feeding Feeding Type: Breast Fed (LC witiness first 15 minutes of BF.)  LATCH Score/Interventions Latch: Grasps breast easily, tongue down, lips flanged, rhythmical sucking.  Audible Swallowing: A few with stimulation Intervention(s): Skin to skin;Hand expression  Type of Nipple: Everted at rest and after stimulation  Comfort (Breast/Nipple): Soft / non-tender     Hold (Positioning): Assistance needed to correctly position infant at breast and maintain latch. Intervention(s): Breastfeeding basics reviewed;Support Pillows;Position options  LATCH Score: 8  Lactation Tools Discussed/Used     Consult Status Consult Status: Follow-up Follow-up type: In-patient    [name] 09/01/2013, 5:02 PM

## 2013-09-02 MED ORDER — IBUPROFEN 800 MG PO TABS: 800.0000 mg | ORAL_TABLET | Freq: Four times a day (QID) | ORAL | Status: DC

## 2013-09-02 MED ORDER — POLYSACCHARIDE IRON COMPLEX 150 MG PO CAPS
150.0000 mg | ORAL_CAPSULE | Freq: Every day | ORAL | Status: DC
  Filled 2013-09-02 (×2): qty 1

## 2013-09-02 MED ORDER — POLYSACCHARIDE IRON COMPLEX 150 MG PO CAPS: 150.0000 mg | ORAL_CAPSULE | Freq: Every day | ORAL | Status: DC

## 2013-09-02 MED ORDER — MAGNESIUM OXIDE 400 (241.3 MG) MG PO TABS
400.0000 mg | ORAL_TABLET | Freq: Every day | ORAL | Status: DC
  Filled 2013-09-02 (×2): qty 1

## 2013-09-02 MED ORDER — MAGNESIUM OXIDE 400 (241.3 MG) MG PO TABS: 400.0000 mg | ORAL_TABLET | Freq: Every day | ORAL | Status: DC

## 2013-09-02 MED ORDER — HYDROCHLOROTHIAZIDE 50 MG PO TABS: 50.0000 mg | ORAL_TABLET | Freq: Every day | ORAL | Status: DC

## 2013-09-02 MED ORDER — OXYCODONE-ACETAMINOPHEN 5-325 MG PO TABS: 1.0000 | ORAL_TABLET | ORAL | Status: DC | PRN

## 2013-09-02 NOTE — Discharge Instructions (Signed)
Breast Pumping Tips Pumping breast milk is a good way produce more milk and a steady supply for your infant. In general, the more you breastfeed or pump, the more milk you will produce. Talk to your doctor or breastfeeding specialist if you need more information or support. HOME CARE  Drink enough fluids to keep your pee (urine) clear or pale yellow.  Eat a healthy diet.  Exercise as told by your doctor.  Rest often. Sleep when your infant sleeps.  Do not smoke.  Ask your doctor about birth control options. Pumping breastmilk:  Relax and find a quiet place to pump. Breast massage, soothing heat on your breasts, music, pictures, or tape recordings of your infant may help you relax.  Place the suction cup of the pump right over the nipple.  Some discomfort is normal at first. If pumping continues to be painful, you may need a different pump. Talk to your breastfeeding specialist.  Put lanolin ointment on sore nipples and the areola.  Pump after each feeding session. This will boost your milk supply.  If you are away from your infant for several hours, pump for 15 minutes every 2 to 3 hours. Pump both breasts.  If your infant has a formula feeding, pump around the same time.  Pump a few weeks before you go back to work. This will help you find a routine that works for you. GET HELP RIGHT AWAY IF:   You are having trouble pumping or feeding your infant.  You think you are not making enough milk.  You have nipple pain, soreness, or redness.  You have other questions or concerns. MAKE SURE YOU:   Understand these instructions.  Will watch your condition.  Will get help right away if you are not doing well or get worse. Document Released: 10/31/2007 Document Revised: 08/06/2011 Document Reviewed: 10/31/2007 [facility] Patient Information 2014 [place], [place].

## 2013-09-02 NOTE — Discharge Summary (Signed)
POSTOPERATIVE DISCHARGE SUMMARY:  Patient ID: Denise [name] MRN: [id number] DOB/AGE: [birth year] 36 y.o.  Admit date: 08/29/2013 Admission Diagnoses: 37.1 weeks / [place] with dependent edema / active HSV outbreak (past 7 days)  Discharge date:  09/02/2013 Discharge Diagnoses: POD 3 s/p primary CS / Active HSV / PIH - stable / dependent edema / IDA with BL anemia   Prenatal history: G1P1001   EDC : 09/19/2013, Alternate EDD Entry  Prenatal care at [facility]  Primary provider : [place] Prenatal course complicated by [facility] / hypertension / edema / HSV  Prenatal Labs: ABO, Rh: --/--/O POS, O POS (04/04 2110)  Antibody: NEG (04/04 2110) Rubella: Immune (09/15 0000)  RPR: NON REACTIVE (04/04 2110)  HBsAg: Negative (09/15 0000)  HIV: Non-reactive (09/15 0000)  GTT : NL GBS: Negative (03/23 0000)   Medical / Surgical History :  Past medical history:  Past Medical History  Diagnosis Date  . IBS (irritable bowel syndrome)   . Migraine   . GERD (gastroesophageal reflux disease)   . HSV-2 infection     2008  . IC (interstitial cystitis)     2008  . Pregnancy induced hypertension     Past surgical history:  Past Surgical History  Procedure Laterality Date  . Shoulder arthroscopy    . Breast surgery  2002    right breast biopsy Benign  . Tonsillectomy    . Knee arthroscopy  3/11, 2012    right knee  . Wisdom tooth extraction    . Cesarean section N/A 08/30/2013    Procedure: CESAREAN SECTION;  Surgeon: Marvene Staff, MD;  Location: [facility];  Service: Obstetrics;  Laterality: N/A;    Family History:  Family History  Problem Relation Age of Onset  . Diabetes Mother   . Hyperlipidemia Mother   . Anemia Mother   . Thyroid disease Sister   . Diabetes Maternal Grandmother   . Diabetes Paternal Grandmother   . Stroke Paternal Grandmother   . Renal Disease Paternal Grandfather   . Heart disease Paternal Grandfather     Social History:  reports that she has  never smoked. She has never used smokeless tobacco. She reports that she does not drink alcohol or use illicit drugs.  Allergies: Adhesive and Latex   Current Medications at time of admission:  Prior to Admission medications   Medication Sig Start Date End Date Taking? Authorizing Provider  acetaminophen (TYLENOL) 325 MG tablet Take 650 mg by mouth every 6 (six) hours as needed for headache.   Yes Historical Provider, MD  fluticasone (FLONASE) 50 MCG/ACT nasal spray Place 2 sprays into both nostrils 2 (two) times daily.   Yes Historical Provider, MD  labetalol (NORMODYNE) 100 MG tablet Take 100 mg by mouth 2 (two) times daily.   Yes Historical Provider, MD  Prenatal Vit-Fe Fumarate-FA (PRENATAL MULTIVITAMIN) TABS tablet Take 1 tablet by mouth daily at 12 noon.   Yes Historical Provider, MD  ranitidine (ZANTAC) 150 MG tablet Take 1 tablet (150 mg total) by mouth 2 (two) times daily. 03/16/13  Yes Lyman Speller, MD  valACYclovir (VALTREX) 500 MG tablet Take 500 mg by mouth daily. Twice daily for three days when breakout occurs   Yes Historical Provider, MD  hydrochlorothiazide (HYDRODIURIL) 50 MG tablet Take 1 tablet (50 mg total) by mouth daily. 09/02/13   Artelia Laroche, CNM  ibuprofen (ADVIL,MOTRIN) 800 MG tablet Take 1 tablet (800 mg total) by mouth every 6 (six) hours. 09/02/13  Artelia Laroche, CNM  iron polysaccharides (NIFEREX) 150 MG capsule Take 1 capsule (150 mg total) by mouth daily. 09/02/13   Artelia Laroche, CNM  magnesium oxide (MAG-OX) 400 (241.3 MG) MG tablet Take 1 tablet (400 mg total) by mouth daily. 09/02/13   Artelia Laroche, CNM  oxyCODONE-acetaminophen (PERCOCET/ROXICET) 5-325 MG per tablet Take 1-2 tablets by mouth every 4 (four) hours as needed for severe pain (moderate - severe pain). 09/02/13   Artelia Laroche, [facility] course: pt was initially admitted for IOL due to severe PIH. Pt however reported onset of HSV lesion. Pt was now not a candidate for induction and due to time when  she ate and not in labor or with ROM, she was placed on OR schedule for am C/S. See operative notes for detail,  Pt was given IV labetalol and had increase in her oral labetalol to 200mg  po bid. Postoperatively, HCTZ was added to her regimen for mgmt of her hypertension. Postoperative course was more notable for baby with severe ABO incompatibility requiring extended stay and triple phototherapy. Pt did well postpartum and was continued on valtrex.  Pt was anemic postoperatively compounded by underlying iron deficiency anemia but was asymptomatic and placed on iron therapy only Procedures: Cesarean section delivery on 08/30/2013 with delivery of  female newborn by Dr Garwin Brothers   See operative report for further details APGAR (1 MIN): 8   APGAR (5 MINS): 9    Postoperative / postpartum course:  discharge on POD 3 Complicated by: ABL anemia compounding IDA of pregnancy  Discharge Instructions: see postpartum booklet from [facility] given to pt  Discharged Condition: stable  Activity: pelvic rest x 6 weeks and postoperative restrictions x 2   Diet: routine  Medications:    Medication List         acetaminophen 325 MG tablet  Commonly known as:  TYLENOL  Take 650 mg by mouth every 6 (six) hours as needed for headache.     fluticasone 50 MCG/ACT nasal spray  Commonly known as:  FLONASE  Place 2 sprays into both nostrils 2 (two) times daily.     hydrochlorothiazide 50 MG tablet  Commonly known as:  HYDRODIURIL  Take 1 tablet (50 mg total) by mouth daily.     ibuprofen 800 MG tablet  Commonly known as:  ADVIL,MOTRIN  Take 1 tablet (800 mg total) by mouth every 6 (six) hours.     iron polysaccharides 150 MG capsule  Commonly known as:  NIFEREX  Take 1 capsule (150 mg total) by mouth daily.     labetalol 100 MG tablet  Commonly known as:  NORMODYNE  Take 100 mg by mouth 2 (two) times daily.     magnesium oxide 400 (241.3 MG) MG tablet  Commonly known as:  MAG-OX  Take 1  tablet (400 mg total) by mouth daily.     oxyCODONE-acetaminophen 5-325 MG per tablet  Commonly known as:  PERCOCET/ROXICET  Take 1-2 tablets by mouth every 4 (four) hours as needed for severe pain (moderate - severe pain).     prenatal multivitamin Tabs tablet  Take 1 tablet by mouth daily at 12 noon.     ranitidine 150 MG tablet  Commonly known as:  ZANTAC  Take 1 tablet (150 mg total) by mouth 2 (two) times daily.     valACYclovir 500 MG tablet  Commonly known as:  VALTREX  Take 500 mg by mouth daily. Twice daily for three days when breakout occurs  Wound Care: keep clean and dry Postpartum Instructions: [facility] discharge booklet - instructions reviewed  Discharge to: Home  Follow up :  [facility] in 5 days for interval visit with midwife for staple removal [facility] in 6 weeks for routine postpartum visit with Latonyia Lopata                Signed: Artelia Laroche CNM, MSN, [facility] 09/02/2013, 11:36 AM

## 2013-09-02 NOTE — Progress Notes (Signed)
POSTOPERATIVE DAY # 3 S/P CS   S:         Reports feeling well - newborn staying additional day             Tolerating po intake / no nausea / no vomiting / + flatus / + BM             Bleeding is light             Pain controlled with motrin and percocet             Up ad lib / ambulatory/ voiding QS  Newborn breast feeding    O:  VS: BP 145/81  Pulse 99  Temp(Src) 97.5 F (36.4 C) (Oral)  Resp 18  Ht 5\' 3"  (1.6 m)  Wt 87.091 kg (192 lb)  BMI 34.02 kg/m2  SpO2 98%  LMP 12/13/2012  Breastfeeding? Unknown   LABS:               Recent Labs  08/31/13 0558  WBC 11.4*  HGB 7.9*  PLT 133*               Bloodtype: --/--/O POS, O POS (04/04 2110)  Rubella: Immune (09/15 0000)                                                         Physical Exam:             Alert and Oriented X3  Lungs: Clear and unlabored  Heart: regular rate and rhythm / no mumurs  Abdomen: soft, non-tender, non-distended active DB             Fundus: firm, non-tender, U-1             Dressing OFF              Incision:  approximated with staples / no erythema / no ecchymosis / no drainage  Perineum: intact  Lochia: light  Extremities: 3+ edema, no calf pain or tenderness, negative Homans  A:        POD # 3 S/P CS            IDA with compounds ABL anemia            Dependent edema  P:        Routine postoperative care              DC home     Artelia Laroche CNM, MSN, [facility] 09/02/2013, 11:29 AM

## 2013-09-03 ENCOUNTER — Ambulatory Visit: Payer: Self-pay

## 2013-09-03 NOTE — Lactation Note (Signed)
This note was copied from the chart of Denise [name]. Lactation Consultation Note  Patient Name: Denise [name] [id number] Date: 09/03/2013 Reason for consult: Follow-up assessment Per mom baby last fed at 0805 , 35 ml of my milk in a bottle,  Per mom the nipple shield and pumping is working well. I filled one of those smaller bottles this am. Praised mom for her milk being in. Reviewed basics, LC suggested when using the nipple shield to consistently instill  EBM into the top of the nipple shield for an appetizer , feed and post pump 10 mins if the baby latches and if the feeding  Is from a bottle post pump 15 -20 mins , save the milk. Reviewed engorgement prevention and tx if needed.  Per mom has DEBP Medela at home. Mom and dad have a good understanding of the lactation plan of care.  Follow up with lactation services 4/14 4pm , O/P apt sheet given to mom , and extra diary sheets. LC encouraged mom to call  With Breast feeding questions or concerns.    Maternal Data Has patient been taught Hand Expression?:  (per mom Judeen Hammans, Lactation consultant showed me how to hand express)  Feeding Feeding Type:  (baby last fed at 0805 35 ml of EBM , sleeping at present ) Length of feed: 35 min  LATCH Score/Interventions                Intervention(s): Breastfeeding basics reviewed (see LC note )     Lactation Tools Discussed/Used Tools: Pump;Other (comment) (mom will have a DEBP Medela at home , also curved tip syirnge ) Nipple shield size: 24 Breast pump type: Double-Electric Breast Pump   Consult Status Consult Status: Follow-up Date: 09/08/13 (4pm , Appointment sheet given to patient ) Follow-up type: [place] 09/03/2013, 10:19 AM

## 2013-09-16 ENCOUNTER — Ambulatory Visit: Payer: Self-pay

## 2013-09-16 NOTE — Lactation Note (Signed)
This note was copied from the chart of Valentina Shaggy. Infant Lactation Consultation Outpatient Visit Note  Patient Name: Valentina Shaggy Date of Birth: 08/30/2011 Birth Weight:  6 lb 13.7 oz (3110 g) Gestational Age at Delivery: Gestational Age: [redacted]w[redacted]d Type of Delivery:  C/S  Breastfeeding History Frequency of Breastfeeding: 5-6 times per day Length of Feeding: 5-20 minutes, mostly on right breast on average 10-15 minutes, not latching well to left breast when latches will nurse on average 6-10 minutes Voids: 8-10/day Stools: 6-7/day, mustard  Supplementing / Method: Pumping:  Type of Pump:  Medela PNS   Frequency:  4-5 times per day, once in AM, afternoon, early evening, before bed  Volume:  Right breast up to 5 oz, left breast 2 1/2 - 3 oz.   Comments: Mom is here for follow up from lactation OP visit on 09/08/13. Mom has stopped using the nipple shield as of last week. Baby is nursing mostly on the right breast. Mom reports once in a while she can get TJ to nurse well on the left breast but he is still struggling to sustain the latch. She is supplementing with 30 ml of EBM 3-4 times per day via bottle. TJ is now 23 weeks of age.    Consultation Evaluation: On exam, Mom's right nipple is erect, short nipple shaft. The left nipple is inverted however will become more erect with stimulation. With sandwiching to help with latch, nipple flattens again. Baby is noted to have short, posterior frenulum. Some limited tongue mobility. Clicking and dimpling noted with the feeding today on the right breast. Mom does not report any pain with nursing. Baby also has high palate.   Initial Feeding Assessment: Pre-feed Weight:  7 lb. 7.2 oz/3378 gm Post-feed Weight:  7 lb. 8.8 oz/3424 gm Amount Transferred:  46 ml. Comments:  From right breast with nursing for 10 minutes. Mom initially latched baby in cradle hold but [place] changed Mom to cross cradle to better support her breast and for TJ to sustain a  deeper latch. Intermittent clicking noted through out the feeding. Some dimpling noted however Mom reports TJ  Has dimples. Mom's nipple was round when he came off the breast and he transferred more milk this visit. Last visit he transferred 26 ml from right breast.   Additional Feeding Assessment: Pre-feed Weight:  7 lb. 8.8 oz/3424 gm Post-feed Weight:  7 lb. 9.2 oz/3436 gm Amount Transferred:  12 ml. EBM via SNS at breast Comments:  Tried several times to latch TJ to left breast with and without nipple shield. Did change nipple shield to size 20 for better fit today. TJ could not organize his suck or sustain the latch. Initiated an SNS at the breast with the #20 nipple shield and after few attempts, in cross cradle, TJ was able to sustain the latch. We started with 15 ml of EBM in SNS, he transferred 12 ml per post weight check indicating no transfer from breast. At the beginning of the feeding he would thrust his tongue but once he developed a suckling pattern using the SNS/nipple shield he was able to demonstrate a rhythmic suck.   Additional Feeding Assessment: Pre-feed Weight: Post-feed Weight: Amount Transferred: Comments:  Total Breast milk Transferred this Visit: 46 ml. Total Supplement Given: 12 ml of EBM via SNS, an additional 5 ml of EBM via bottle for total supplement of 17 ml.   Additional Interventions: Advised Mom to use the nipple shield and SNS to keep working with [employer]  TJ to learn to latch on the left breast.  Encouraged to BF every 2-3 hours, try to keep baby TJ actively nursing for 15-20 minutes. Start with the right breast. On the left breast use the nipple shield and give supplement via SNS to help him learn to latch. If he will not latch with SNS/nipple shield then supplement via bottle as she has been doing. Don't let Mom or Baby get frustrated.  If baby not latching to left breast be sure to pump every 3 hours for 15 minutes. Post pump both breast 4-6 times per day to  protect milk supply till TJ is latching consistently Continue to supplement with 30 ml of EBM till baby latching better. If interested in having frenulum evaluated, speak to Peds. This may help with discomfort and latch, milk transfer.    Follow-Up Lactation OP Monday, 09/21/13 at 2:30 for weight check and feeding assessment. Support group, prn.      Georga Kaufmann Rosalin Buster 09/16/2013, 6:16 PM

## 2013-09-24 ENCOUNTER — Inpatient Hospital Stay (HOSPITAL_COMMUNITY)
Admission: AD | Admit: 2013-09-24 | Discharge: 2013-09-25 | Disposition: A | Discharge status: Home / Self Care | Payer: 59 | Source: Ambulatory Visit | Attending: Obstetrics and Gynecology | Admitting: Obstetrics and Gynecology

## 2013-09-24 DIAGNOSIS — O135 Gestational [pregnancy-induced] hypertension without significant proteinuria, complicating the puerperium: Secondary | ICD-10-CM | POA: Insufficient documentation

## 2013-09-24 DIAGNOSIS — K219 Gastro-esophageal reflux disease without esophagitis: Secondary | ICD-10-CM | POA: Insufficient documentation

## 2013-09-24 DIAGNOSIS — K589 Irritable bowel syndrome without diarrhea: Secondary | ICD-10-CM | POA: Insufficient documentation

## 2013-09-24 DIAGNOSIS — IMO0002 Reserved for concepts with insufficient information to code with codable children: Secondary | ICD-10-CM | POA: Insufficient documentation

## 2013-09-24 DIAGNOSIS — R609 Edema, unspecified: Secondary | ICD-10-CM

## 2013-09-24 DIAGNOSIS — O165 Unspecified maternal hypertension, complicating the puerperium: Secondary | ICD-10-CM

## 2013-09-24 LAB — CBC
HEMATOCRIT: 30.8 % — AB (ref 36.0–46.0)
Hemoglobin: 10.9 g/dL — ABNORMAL LOW (ref 12.0–15.0)
MCH: 29.5 pg (ref 26.0–34.0)
MCHC: 35.4 g/dL (ref 30.0–36.0)
MCV: 83.2 fL (ref 78.0–100.0)
Platelets: 244 10*3/uL (ref 150–400)
RBC: 3.7 MIL/uL — AB (ref 3.87–5.11)
RDW: 14 % (ref 11.5–15.5)
WBC: 5.3 10*3/uL (ref 4.0–10.5)

## 2013-09-24 LAB — URINALYSIS, ROUTINE W REFLEX MICROSCOPIC
Bilirubin Urine: NEGATIVE
GLUCOSE, UA: NEGATIVE mg/dL
HGB URINE DIPSTICK: NEGATIVE
Ketones, ur: NEGATIVE mg/dL
Leukocytes, UA: NEGATIVE
Nitrite: NEGATIVE
Protein, ur: NEGATIVE mg/dL
Specific Gravity, Urine: 1.01 (ref 1.005–1.030)
Urobilinogen, UA: 0.2 mg/dL (ref 0.0–1.0)
pH: 6 (ref 5.0–8.0)

## 2013-09-24 MED ORDER — ACETAMINOPHEN 500 MG PO TABS
1000.0000 mg | ORAL_TABLET | Freq: Once | ORAL | Status: AC
  Administered 2013-09-24: 1000 mg via ORAL
  Filled 2013-09-24: qty 2

## 2013-09-24 MED ORDER — LABETALOL HCL 100 MG PO TABS
200.0000 mg | ORAL_TABLET | Freq: Once | ORAL | Status: AC
  Administered 2013-09-24: 200 mg via ORAL
  Filled 2013-09-24: qty 2

## 2013-09-24 NOTE — MAU Note (Signed)
Pt reports B/P at home 165/108, 175/110. Headache and in feet. Stopped B/P meds on Monday per MD order. Has been on meds since C/S on 04/05.

## 2013-09-25 LAB — COMPREHENSIVE METABOLIC PANEL
ALT: 22 U/L (ref 0–35)
AST: 27 U/L (ref 0–37)
Albumin: 3.6 g/dL (ref 3.5–5.2)
Alkaline Phosphatase: 142 U/L — ABNORMAL HIGH (ref 39–117)
BILIRUBIN TOTAL: 0.7 mg/dL (ref 0.3–1.2)
BUN: 12 mg/dL (ref 6–23)
CHLORIDE: 105 meq/L (ref 96–112)
CO2: 25 meq/L (ref 19–32)
CREATININE: 0.85 mg/dL (ref 0.50–1.10)
Calcium: 9 mg/dL (ref 8.4–10.5)
GFR calc Af Amer: >90 mL/min (ref >90)
GFR, EST NON AFRICAN AMERICAN: 87 mL/min — AB (ref >90)
Glucose, Bld: 102 mg/dL — ABNORMAL HIGH (ref 70–99)
Potassium: 3.9 mEq/L (ref 3.7–5.3)
Sodium: 143 mEq/L (ref 137–147)
Total Protein: 6.3 g/dL (ref 6.0–8.3)

## 2013-09-25 LAB — URIC ACID: Uric Acid, Serum: 6.7 mg/dL (ref 2.4–7.0)

## 2013-09-25 MED ORDER — HYDRALAZINE HCL 10 MG PO TABS: 10.0000 mg | ORAL_TABLET | Freq: Four times a day (QID) | ORAL | Status: DC

## 2013-09-25 MED ORDER — HYDRALAZINE HCL 20 MG/ML IJ SOLN
5.0000 mg | Freq: Once | INTRAMUSCULAR | Status: AC
  Administered 2013-09-25: 5 mg via INTRAVENOUS
  Filled 2013-09-25: qty 1

## 2013-09-25 MED ORDER — HYDRALAZINE HCL 10 MG PO TABS
10.0000 mg | ORAL_TABLET | Freq: Four times a day (QID) | ORAL | Status: DC
  Filled 2013-09-25 (×2): qty 1

## 2013-09-25 NOTE — Discharge Instructions (Signed)

## 2013-09-25 NOTE — MAU Provider Note (Signed)
History     CSN: [id number]  Arrival date and time: 09/24/13 2229 RN notified CNM of pt arrival @2305  First Provider Initiated Contact with Patient 09/25/13 0000      Chief Complaint  Patient presents with  . Hypertension  . Headache   HPI Comments: G1P1001, 3 wks post-op CS for +HSV outbreak and PIH, c/o worsening LE edema and elevated BP x2 days, HA today. Blurry vision a few days ago, no epigastric pain. Lochia and pain minimal. Breastfeeding newborn. Was taking Labetalol 100 mg bid until 1 week ago.   Hypertension Associated symptoms include headaches.  Headache  Her past medical history is significant for hypertension.    OB History   Grav Para Term Preterm Abortions TAB SAB Ect Mult Living   1 1 1       1       Past Medical History  Diagnosis Date  . IBS (irritable bowel syndrome)   . Migraine   . GERD (gastroesophageal reflux disease)   . HSV-2 infection     2008  . IC (interstitial cystitis)     2008  . Pregnancy induced hypertension     Past Surgical History  Procedure Laterality Date  . Shoulder arthroscopy    . Breast surgery  2002    right breast biopsy Benign  . Tonsillectomy    . Knee arthroscopy  3/11, 2012    right knee  . Wisdom tooth extraction    . Cesarean section N/A 08/30/2013    Procedure: CESAREAN SECTION;  Surgeon: Marvene Staff, MD;  Location: [facility];  Service: Obstetrics;  Laterality: N/A;    Family History  Problem Relation Age of Onset  . Diabetes Mother   . Hyperlipidemia Mother   . Anemia Mother   . Thyroid disease Sister   . Diabetes Maternal Grandmother   . Diabetes Paternal Grandmother   . Stroke Paternal Grandmother   . Renal Disease Paternal Grandfather   . Heart disease Paternal Grandfather     History  Substance Use Topics  . Smoking status: Never Smoker   . Smokeless tobacco: Never Used  . Alcohol Use: No     Comment: socially, not while pregnant    Allergies:  Allergies  Allergen Reactions  .  Adhesive [Tape] Rash  . Latex Itching and Rash    Rash and itching from use of gloves    Prescriptions prior to admission  Medication Sig Dispense Refill  . acetaminophen (TYLENOL) 325 MG tablet Take 650 mg by mouth every 6 (six) hours as needed for headache.      . fluticasone (FLONASE) 50 MCG/ACT nasal spray Place 2 sprays into both nostrils 2 (two) times daily.      Marland Kitchen ibuprofen (ADVIL,MOTRIN) 800 MG tablet Take 1 tablet (800 mg total) by mouth every 6 (six) hours.  30 tablet  0  . iron polysaccharides (NIFEREX) 150 MG capsule Take 1 capsule (150 mg total) by mouth daily.  45 capsule  0  . magnesium oxide (MAG-OX) 400 (241.3 MG) MG tablet Take 1 tablet (400 mg total) by mouth daily.  45 tablet  0  . oxyCODONE-acetaminophen (PERCOCET/ROXICET) 5-325 MG per tablet Take 1-2 tablets by mouth every 4 (four) hours as needed for severe pain (moderate - severe pain).  30 tablet  0  . Prenatal Vit-Fe Fumarate-FA (PRENATAL MULTIVITAMIN) TABS tablet Take 1 tablet by mouth daily at 12 noon.      . ranitidine (ZANTAC) 150 MG tablet Take 1 tablet (150  mg total) by mouth 2 (two) times daily.  60 tablet  2  . hydrochlorothiazide (HYDRODIURIL) 50 MG tablet Take 1 tablet (50 mg total) by mouth daily.  14 tablet  0  . labetalol (NORMODYNE) 100 MG tablet Take 100 mg by mouth 2 (two) times daily.      . valACYclovir (VALTREX) 500 MG tablet Take 500 mg by mouth daily. Twice daily for three days when breakout occurs        Review of Systems  Constitutional: Negative.   Eyes: Negative.   Respiratory: Negative.   Cardiovascular: Positive for leg swelling.  Gastrointestinal: Negative.   Genitourinary: Negative.   Musculoskeletal: Negative.   Skin: Negative.   Neurological: Positive for headaches.  Endo/Heme/Allergies: Negative.   Psychiatric/Behavioral: Negative.    Physical Exam   Blood pressure 186/105, pulse 66, temperature 98.1 F (36.7 C), temperature source Oral, resp. rate 18, SpO2 100.00%, unknown  if currently breastfeeding.  Physical Exam  Constitutional: She is oriented to person, place, and time. She appears well-developed and well-nourished.  HENT:  Head: Normocephalic.  Neck: Normal range of motion.  Cardiovascular: Normal rate and regular rhythm.   Respiratory: Effort normal and breath sounds normal.  GI: Soft. Bowel sounds are normal.  Low transverse abd incision well approximated, no drainage, erythema, or edema  Genitourinary:  deferred  Musculoskeletal: Normal range of motion. She exhibits edema.  1+ edema to BLE  Neurological: She is alert and oriented to person, place, and time. She has normal reflexes.  No clonus  Skin: Skin is warm and dry.  Psychiatric: She has a normal mood and affect.   Results for Felten, [facility] (MRN [id number]) as of 09/25/2013 00:24  Ref. Range 09/24/2013 23:30  Sodium Latest Range: 137-147 mEq/L 143  Potassium Latest Range: 3.7-5.3 mEq/L 3.9  Chloride Latest Range: 96-112 mEq/L 105  CO2 Latest Range: 19-32 mEq/L 25  BUN Latest Range: 6-23 mg/dL 12  Creatinine Latest Range: 0.50-1.10 mg/dL 0.85  Calcium Latest Range: 8.4-10.5 mg/dL 9.0  GFR calc non Af Amer Latest Range: >90 mL/min 87 (L)  GFR calc Af Amer Latest Range: >90 mL/min >90  Glucose Latest Range: 70-99 mg/dL 102 (H)  Alkaline Phosphatase Latest Range: 39-117 U/L 142 (H)  Albumin Latest Range: 3.5-5.2 g/dL 3.6  Uric Acid, Serum Latest Range: 2.4-7.0 mg/dL 6.7  AST Latest Range: 0-37 U/L 27  ALT Latest Range: 0-35 U/L 22  Total Protein Latest Range: 6.0-8.3 g/dL 6.3  Total Bilirubin Latest Range: 0.3-1.2 mg/dL 0.7  WBC Latest Range: 4.0-10.5 K/uL 5.3  RBC Latest Range: 3.87-5.11 MIL/uL 3.70 (L)  Hemoglobin Latest Range: 12.0-15.0 g/dL 10.9 (L)  HCT Latest Range: 36.0-46.0 % 30.8 (L)  MCV Latest Range: 78.0-100.0 fL 83.2  MCH Latest Range: 26.0-34.0 pg 29.5  MCHC Latest Range: 30.0-36.0 g/dL 35.4  RDW Latest Range: 11.5-15.5 % 14.0  Platelets Latest Range: 150-400 K/uL 244   Results for Luger, [facility] (MRN [id number]) as of 09/25/2013 01:48  Ref. Range 09/24/2013 22:35  Color, Urine Latest Range: YELLOW  YELLOW  APPearance Latest Range: CLEAR  CLEAR  Specific Gravity, Urine Latest Range: 1.005-1.030  1.010  pH Latest Range: 5.0-8.0  6.0  Glucose Latest Range: NEGATIVE mg/dL NEGATIVE  Bilirubin Urine Latest Range: NEGATIVE  NEGATIVE  Ketones, ur Latest Range: NEGATIVE mg/dL NEGATIVE  Protein Latest Range: NEGATIVE mg/dL NEGATIVE  Urobilinogen, UA Latest Range: 0.0-1.0 mg/dL 0.2  Nitrite Latest Range: NEGATIVE  NEGATIVE  Leukocytes, UA Latest Range: NEGATIVE  NEGATIVE  Hgb urine dipstick  Latest Range: NEGATIVE  NEGATIVE   MAU Course  Procedures Labetalol 200 mg po x1-no response, BP's remain 160-170/100s Tylenol 1000 mg po x1- some relief from HA initially, then recurrence Hydralazine 5 mg IV x1-no response, BP's remain 170/90-100 Hydralazine 5 mg IV (second dose)- good response with BP decrease to 140s/80s   Assessment and Plan  PIH, delivered Postpartum Hypertension Dependent edema  Discharge home Start Hydralazine 10 mg q6 hrs Follow-up with PCP in 1 week HTN warning signs Elevate LE, increase water intake  Discussed assessment and plan with Dr. Tobie Lords 09/25/2013, 12:29 AM

## 2013-09-26 NOTE — MAU Provider Note (Signed)
Pt known to me. Was weaned of labetalol 200 mg po bid. No evidence of preeclampsia. May have underlying undiagnosed chronic HTn. Will have pcp involvement as outpt

## 2013-11-26 ENCOUNTER — Other Ambulatory Visit: Payer: Self-pay

## 2013-11-26 MED ORDER — VALACYCLOVIR HCL 500 MG PO TABS: 500.0000 mg | ORAL_TABLET | Freq: Two times a day (BID) | ORAL | Status: DC

## 2013-11-26 NOTE — Telephone Encounter (Signed)
Last [name]: 06/13/12 Last refill: 05/19/2013 #180 3 refills Current [name]:pt is due Pt recently had a baby in April Please advise

## 2014-03-29 ENCOUNTER — Encounter (HOSPITAL_COMMUNITY): Payer: Self-pay | Admitting: Obstetrics and Gynecology

## 2014-05-24 ENCOUNTER — Other Ambulatory Visit: Payer: Self-pay | Admitting: Family Medicine

## 2014-05-24 ENCOUNTER — Ambulatory Visit
Admission: RE | Admit: 2014-05-24 | Discharge: 2014-05-24 | Disposition: A | Discharge status: Home / Self Care | Payer: Medicaid Other | Source: Ambulatory Visit | Attending: Family Medicine | Admitting: Family Medicine

## 2014-05-24 DIAGNOSIS — R0781 Pleurodynia: Secondary | ICD-10-CM

## 2014-08-17 ENCOUNTER — Emergency Department (HOSPITAL_COMMUNITY)
Admission: EM | Admit: 2014-08-17 | Discharge: 2014-08-18 | Disposition: A | Discharge status: Home / Self Care | Payer: Medicaid Other | Attending: Emergency Medicine | Admitting: Emergency Medicine

## 2014-08-17 ENCOUNTER — Encounter (HOSPITAL_COMMUNITY): Payer: Self-pay | Admitting: Emergency Medicine

## 2014-08-17 DIAGNOSIS — Z7951 Long term (current) use of inhaled steroids: Secondary | ICD-10-CM | POA: Diagnosis not present

## 2014-08-17 DIAGNOSIS — Z8619 Personal history of other infectious and parasitic diseases: Secondary | ICD-10-CM | POA: Diagnosis not present

## 2014-08-17 DIAGNOSIS — R1084 Generalized abdominal pain: Secondary | ICD-10-CM | POA: Diagnosis not present

## 2014-08-17 DIAGNOSIS — R109 Unspecified abdominal pain: Secondary | ICD-10-CM | POA: Diagnosis present

## 2014-08-17 DIAGNOSIS — K219 Gastro-esophageal reflux disease without esophagitis: Secondary | ICD-10-CM | POA: Insufficient documentation

## 2014-08-17 DIAGNOSIS — G43909 Migraine, unspecified, not intractable, without status migrainosus: Secondary | ICD-10-CM | POA: Insufficient documentation

## 2014-08-17 DIAGNOSIS — Z91040 Latex allergy status: Secondary | ICD-10-CM | POA: Diagnosis not present

## 2014-08-17 DIAGNOSIS — Z3202 Encounter for pregnancy test, result negative: Secondary | ICD-10-CM | POA: Insufficient documentation

## 2014-08-17 DIAGNOSIS — Z79899 Other long term (current) drug therapy: Secondary | ICD-10-CM | POA: Insufficient documentation

## 2014-08-17 DIAGNOSIS — Z87448 Personal history of other diseases of urinary system: Secondary | ICD-10-CM | POA: Insufficient documentation

## 2014-08-17 LAB — URINALYSIS, ROUTINE W REFLEX MICROSCOPIC
BILIRUBIN URINE: NEGATIVE
GLUCOSE, UA: NEGATIVE mg/dL
HGB URINE DIPSTICK: NEGATIVE
Ketones, ur: NEGATIVE mg/dL
Leukocytes, UA: NEGATIVE
Nitrite: NEGATIVE
PROTEIN: NEGATIVE mg/dL
SPECIFIC GRAVITY, URINE: 1.005 (ref 1.005–1.030)
UROBILINOGEN UA: 0.2 mg/dL (ref 0.0–1.0)
pH: 7 (ref 5.0–8.0)

## 2014-08-17 LAB — CBC WITH DIFFERENTIAL/PLATELET
BASOS ABS: 0 10*3/uL (ref 0.0–0.1)
BASOS PCT: 0 % (ref 0–1)
Eosinophils Absolute: 0.1 10*3/uL (ref 0.0–0.7)
Eosinophils Relative: 1 % (ref 0–5)
HEMATOCRIT: 40.4 % (ref 36.0–46.0)
Hemoglobin: 14.4 g/dL (ref 12.0–15.0)
Lymphocytes Relative: 35 % (ref 12–46)
Lymphs Abs: 2.4 10*3/uL (ref 0.7–4.0)
MCH: 29.4 pg (ref 26.0–34.0)
MCHC: 35.6 g/dL (ref 30.0–36.0)
MCV: 82.4 fL (ref 78.0–100.0)
MONO ABS: 0.5 10*3/uL (ref 0.1–1.0)
Monocytes Relative: 8 % (ref 3–12)
NEUTROS ABS: 4 10*3/uL (ref 1.7–7.7)
Neutrophils Relative %: 56 % (ref 43–77)
PLATELETS: 305 10*3/uL (ref 150–400)
RBC: 4.9 MIL/uL (ref 3.87–5.11)
RDW: 13 % (ref 11.5–15.5)
WBC: 7.1 10*3/uL (ref 4.0–10.5)

## 2014-08-17 LAB — COMPREHENSIVE METABOLIC PANEL
ALT: 30 U/L (ref 0–35)
AST: 23 U/L (ref 0–37)
Albumin: 4.4 g/dL (ref 3.5–5.2)
Alkaline Phosphatase: 68 U/L (ref 39–117)
Anion gap: 7 (ref 5–15)
BUN: 19 mg/dL (ref 6–23)
CALCIUM: 9.2 mg/dL (ref 8.4–10.5)
CO2: 29 mmol/L (ref 19–32)
Chloride: 96 mmol/L (ref 96–112)
Creatinine, Ser: 0.84 mg/dL (ref 0.50–1.10)
GFR calc Af Amer: >90 mL/min (ref >90)
GFR calc non Af Amer: 88 mL/min — ABNORMAL LOW (ref >90)
Glucose, Bld: 91 mg/dL (ref 70–99)
Potassium: 3.9 mmol/L (ref 3.5–5.1)
Sodium: 132 mmol/L — ABNORMAL LOW (ref 135–145)
TOTAL PROTEIN: 7.6 g/dL (ref 6.0–8.3)
Total Bilirubin: 0.9 mg/dL (ref 0.3–1.2)

## 2014-08-17 LAB — POC URINE PREG, ED: Preg Test, Ur: NEGATIVE

## 2014-08-17 LAB — LIPASE, BLOOD: LIPASE: 22 U/L (ref 11–59)

## 2014-08-17 MED ORDER — ONDANSETRON HCL 4 MG/2ML IJ SOLN
4.0000 mg | Freq: Once | INTRAMUSCULAR | Status: AC
Start: 2014-08-17 — End: 2014-08-17
  Administered 2014-08-17: 4 mg via INTRAVENOUS
  Filled 2014-08-17: qty 2

## 2014-08-17 MED ORDER — OMEPRAZOLE 20 MG PO CPDR: 20.0000 mg | DELAYED_RELEASE_CAPSULE | Freq: Every day | ORAL | Status: DC

## 2014-08-17 MED ORDER — OXYCODONE-ACETAMINOPHEN 5-325 MG PO TABS: 2.0000 | ORAL_TABLET | ORAL | Status: DC | PRN

## 2014-08-17 MED ORDER — SODIUM CHLORIDE 0.9 % IV BOLUS (SEPSIS)
1000.0000 mL | Freq: Once | INTRAVENOUS | Status: AC
  Administered 2014-08-17: 1000 mL via INTRAVENOUS

## 2014-08-17 MED ORDER — MORPHINE SULFATE 4 MG/ML IJ SOLN
4.0000 mg | Freq: Once | INTRAMUSCULAR | Status: AC
  Administered 2014-08-17: 4 mg via INTRAVENOUS
  Filled 2014-08-17: qty 1

## 2014-08-17 MED ORDER — ONDANSETRON HCL 4 MG PO TABS: 4.0000 mg | ORAL_TABLET | Freq: Four times a day (QID) | ORAL | Status: DC

## 2014-08-17 NOTE — ED Notes (Signed)
Pt states that she has had bouts of twisting abdominal pain x several months that comes and goes. Pain has come back tonight. Hx of IBS and acid reflux. Denies N/V/D. Alert and oriented.

## 2014-08-17 NOTE — Discharge Instructions (Signed)

## 2014-08-17 NOTE — ED Notes (Signed)
Pt stated she would like to have her perscription sent to the pharmacy if possible

## 2014-08-17 NOTE — ED Provider Notes (Signed)
CSN: [id number]     Arrival date & time 08/17/14  2056 History   First MD Initiated Contact with Patient 08/17/14 2231     Chief Complaint  Patient presents with  . Abdominal Pain     (Consider location/radiation/quality/duration/timing/severity/associated sxs/prior Treatment) Patient is a 39 y.o. female presenting with abdominal pain. The history is provided by the patient. No language interpreter was used.  Abdominal Pain Associated symptoms: no dysuria   Denise [name] is a 39 y.o black female with a history of IBS and gerd who presents with generalized abdominal pain that is intermittent and twisting in nature since yesterday. She says she feels gassy. Her last BM was 6 hours ago and normal.  Nothing makes it better or worse. Her pain is an 8/10. She denies any fever, chills, vomiting, diarrhea, cough, chest pain, or NSAID use. LMP was three weeks ago. No history of STD. She has had 4 similar previous episodes since January and the pain usually resolves on its own.   Past Medical History  Diagnosis Date  . IBS (irritable bowel syndrome)   . Migraine   . GERD (gastroesophageal reflux disease)   . HSV-2 infection     2008  . IC (interstitial cystitis)     2008  . Pregnancy induced hypertension    Past Surgical History  Procedure Laterality Date  . Shoulder arthroscopy    . Breast surgery  2002    right breast biopsy Benign  . Tonsillectomy    . Knee arthroscopy  3/11, 2012    right knee  . Wisdom tooth extraction    . Cesarean section N/A 08/30/2013    Procedure: CESAREAN SECTION;  Surgeon: Marvene Staff, MD;  Location: [facility];  Service: Obstetrics;  Laterality: N/A;   Family History  Problem Relation Age of Onset  . Diabetes Mother   . Hyperlipidemia Mother   . Anemia Mother   . Thyroid disease Sister   . Diabetes Maternal Grandmother   . Diabetes Paternal Grandmother   . Stroke Paternal Grandmother   . Renal Disease Paternal Grandfather   . Heart disease Paternal  Grandfather    History  Substance Use Topics  . Smoking status: Never Smoker   . Smokeless tobacco: Never Used  . Alcohol Use: No     Comment: socially, not while pregnant   OB History    Gravida Para Term Preterm AB TAB SAB Ectopic Multiple Living   1 1 1       1      Review of Systems  Gastrointestinal: Positive for abdominal pain.  Genitourinary: Negative for dysuria, frequency and difficulty urinating.  All other systems reviewed and are negative.     Allergies  Adhesive and Latex  Home Medications   Prior to Admission medications   Medication Sig Start Date End Date Taking? Authorizing Provider  acetaminophen (TYLENOL) 325 MG tablet Take 650 mg by mouth every 6 (six) hours as needed for headache.   Yes Historical Provider, MD  fluticasone (FLONASE) 50 MCG/ACT nasal spray Place 2 sprays into both nostrils 2 (two) times daily as needed for allergies.    Yes Historical Provider, MD  lisinopril-hydrochlorothiazide (PRINZIDE,ZESTORETIC) 20-25 MG per tablet Take 1 tablet by mouth daily.   Yes Historical Provider, MD  norethindrone-ethinyl estradiol 1/35 (ORTHO-NOVUM, NORTREL,CYCLAFEM) tablet Take 1 tablet by mouth daily.   Yes Historical Provider, MD  Polyethyl Glycol-Propyl Glycol (SYSTANE OP) Apply 2 drops to eye daily as needed (dry eyes/contacts.).   Yes Historical  Provider, MD  Prenatal Vit-Fe Fumarate-FA (PRENATAL MULTIVITAMIN) TABS tablet Take 1 tablet by mouth daily at 12 noon.   Yes Historical Provider, MD  valACYclovir (VALTREX) 500 MG tablet Take 1 tablet (500 mg total) by mouth 2 (two) times daily. 11/26/13  Yes Antonietta Barcelona, FNP  hydrALAZINE (APRESOLINE) 10 MG tablet Take 1 tablet (10 mg total) by mouth every 6 (six) hours. Patient not taking: Reported on 08/17/2014 09/25/13   Graciela Husbands, CNM  ibuprofen (ADVIL,MOTRIN) 800 MG tablet Take 1 tablet (800 mg total) by mouth every 6 (six) hours. Patient not taking: Reported on 08/17/2014 09/02/13   Artelia Laroche, CNM   iron polysaccharides (NIFEREX) 150 MG capsule Take 1 capsule (150 mg total) by mouth daily. Patient not taking: Reported on 08/17/2014 09/02/13   Artelia Laroche, CNM  magnesium oxide (MAG-OX) 400 (241.3 MG) MG tablet Take 1 tablet (400 mg total) by mouth daily. Patient not taking: Reported on 08/17/2014 09/02/13   Artelia Laroche, CNM  omeprazole (PRILOSEC) 20 MG capsule Take 1 capsule (20 mg total) by mouth daily. 08/17/14   Davonna Belling, MD  ondansetron (ZOFRAN) 4 MG tablet Take 1 tablet (4 mg total) by mouth every 6 (six) hours. 08/17/14   Davonna Belling, MD  oxyCODONE-acetaminophen (PERCOCET/ROXICET) 5-325 MG per tablet Take 2 tablets by mouth every 4 (four) hours as needed for severe pain. 08/17/14   Davonna Belling, MD  ranitidine (ZANTAC) 150 MG tablet Take 1 tablet (150 mg total) by mouth 2 (two) times daily. Patient not taking: Reported on 08/17/2014 03/16/13   Megan Salon, MD   BP 128/76 mmHg  Pulse 78  Temp(Src) 97.8 F (36.6 C) (Oral)  Resp 16  SpO2 100%  LMP 07/27/2014 (Approximate) Physical Exam  Constitutional: She is oriented to person, place, and time. She appears well-developed and well-nourished.  HENT:  Head: Normocephalic and atraumatic.  Eyes: Conjunctivae and EOM are normal.  Neck: Normal range of motion. Neck supple.  Cardiovascular: Normal rate, regular rhythm and normal heart sounds.   Pulmonary/Chest: Effort normal and breath sounds normal.  Abdominal: Soft. Normal appearance. She exhibits no distension and no mass. There is generalized tenderness. There is no CVA tenderness. No hernia.  Musculoskeletal: Normal range of motion.  Neurological: She is alert and oriented to person, place, and time.  Skin: Skin is warm and dry.  Nursing note and vitals reviewed.   ED Course  Procedures (including critical care time) Labs Review Labs Reviewed  COMPREHENSIVE METABOLIC PANEL - Abnormal; Notable for the following:    Sodium 132 (*)    GFR calc non Af Amer 88 (*)     All other components within normal limits  CBC WITH DIFFERENTIAL/PLATELET  LIPASE, BLOOD  URINALYSIS, ROUTINE W REFLEX MICROSCOPIC  POC URINE PREG, ED    Imaging Review No results found.   EKG Interpretation None      MDM   Final diagnoses:  Generalized abdominal pain  Patient is complaining of generalized abdominal tenderness.  She states she has had this 4 times since January.   On exam she has some diffuse generalized abdominal tenderness to palpation.  No abdominal distention. No vomiting or diarrhea.  Her vitals are stable.  Her labs are unremarkable. She has a GI appointment next Tuesday.   I have given her zofran, pain meds, and fluids here.  23:45 Her pain has improved.  I have discharged her with omeprazole, pain medication and zofran.  I explained the labs and told her to  keep her GI appointment.      Ottie Glazier, PA-C 08/18/14 0018  Davonna Belling, MD 08/20/14 2350

## 2014-09-02 ENCOUNTER — Other Ambulatory Visit: Payer: Self-pay | Admitting: Gastroenterology

## 2014-09-02 DIAGNOSIS — R1011 Right upper quadrant pain: Secondary | ICD-10-CM

## 2014-09-03 ENCOUNTER — Ambulatory Visit
Admission: RE | Admit: 2014-09-03 | Discharge: 2014-09-03 | Disposition: A | Discharge status: Home / Self Care | Payer: Medicaid Other | Source: Ambulatory Visit | Attending: Gastroenterology | Admitting: Gastroenterology

## 2014-09-03 DIAGNOSIS — R1011 Right upper quadrant pain: Secondary | ICD-10-CM

## 2014-12-03 ENCOUNTER — Encounter (HOSPITAL_COMMUNITY): Payer: Self-pay | Admitting: Emergency Medicine

## 2014-12-03 DIAGNOSIS — Z79899 Other long term (current) drug therapy: Secondary | ICD-10-CM | POA: Diagnosis not present

## 2014-12-03 DIAGNOSIS — Z3202 Encounter for pregnancy test, result negative: Secondary | ICD-10-CM | POA: Diagnosis not present

## 2014-12-03 DIAGNOSIS — Z8742 Personal history of other diseases of the female genital tract: Secondary | ICD-10-CM | POA: Insufficient documentation

## 2014-12-03 DIAGNOSIS — K219 Gastro-esophageal reflux disease without esophagitis: Secondary | ICD-10-CM | POA: Diagnosis not present

## 2014-12-03 DIAGNOSIS — G43909 Migraine, unspecified, not intractable, without status migrainosus: Secondary | ICD-10-CM | POA: Diagnosis not present

## 2014-12-03 DIAGNOSIS — K529 Noninfective gastroenteritis and colitis, unspecified: Secondary | ICD-10-CM | POA: Diagnosis not present

## 2014-12-03 DIAGNOSIS — Z91040 Latex allergy status: Secondary | ICD-10-CM | POA: Diagnosis not present

## 2014-12-03 DIAGNOSIS — Z8619 Personal history of other infectious and parasitic diseases: Secondary | ICD-10-CM | POA: Insufficient documentation

## 2014-12-03 DIAGNOSIS — Z7952 Long term (current) use of systemic steroids: Secondary | ICD-10-CM | POA: Insufficient documentation

## 2014-12-03 DIAGNOSIS — R1013 Epigastric pain: Secondary | ICD-10-CM | POA: Diagnosis present

## 2014-12-03 NOTE — ED Notes (Signed)
Patient arrives with complaint of upper medial abdominal pain which has progressively gotten worse since yesterday. States history of similar episodes over the past several months. Explains that pain is intermittent and is terrible when it strikes. Endorses dry heaves, but no emesis.

## 2014-12-04 ENCOUNTER — Emergency Department (HOSPITAL_COMMUNITY): Payer: Medicaid Other

## 2014-12-04 ENCOUNTER — Encounter (HOSPITAL_COMMUNITY): Payer: Self-pay | Admitting: Radiology

## 2014-12-04 ENCOUNTER — Emergency Department (HOSPITAL_COMMUNITY)
Admission: EM | Admit: 2014-12-04 | Discharge: 2014-12-04 | Disposition: A | Discharge status: Home / Self Care | Payer: Medicaid Other | Attending: Emergency Medicine | Admitting: Emergency Medicine

## 2014-12-04 DIAGNOSIS — R1013 Epigastric pain: Secondary | ICD-10-CM

## 2014-12-04 DIAGNOSIS — K529 Noninfective gastroenteritis and colitis, unspecified: Secondary | ICD-10-CM

## 2014-12-04 LAB — COMPREHENSIVE METABOLIC PANEL
ALBUMIN: 3.9 g/dL (ref 3.5–5.0)
ALT: 27 U/L (ref 14–54)
ANION GAP: 12 (ref 5–15)
AST: 27 U/L (ref 15–41)
Alkaline Phosphatase: 55 U/L (ref 38–126)
BILIRUBIN TOTAL: 1.9 mg/dL — AB (ref 0.3–1.2)
BUN: 10 mg/dL (ref 6–20)
CO2: 28 mmol/L (ref 22–32)
CREATININE: 0.88 mg/dL (ref 0.44–1.00)
Calcium: 9.6 mg/dL (ref 8.9–10.3)
Chloride: 97 mmol/L — ABNORMAL LOW (ref 101–111)
GFR calc Af Amer: >60 mL/min (ref >60)
GFR calc non Af Amer: >60 mL/min (ref >60)
Glucose, Bld: 100 mg/dL — ABNORMAL HIGH (ref 65–99)
Potassium: 3.8 mmol/L (ref 3.5–5.1)
Sodium: 137 mmol/L (ref 135–145)
TOTAL PROTEIN: 6.8 g/dL (ref 6.5–8.1)

## 2014-12-04 LAB — URINALYSIS, ROUTINE W REFLEX MICROSCOPIC
Bilirubin Urine: NEGATIVE
GLUCOSE, UA: NEGATIVE mg/dL
Hgb urine dipstick: NEGATIVE
Ketones, ur: 15 mg/dL — AB
LEUKOCYTES UA: NEGATIVE
Nitrite: NEGATIVE
Protein, ur: NEGATIVE mg/dL
Specific Gravity, Urine: 1.021 (ref 1.005–1.030)
Urobilinogen, UA: 0.2 mg/dL (ref 0.0–1.0)
pH: 7 (ref 5.0–8.0)

## 2014-12-04 LAB — POC URINE PREG, ED: PREG TEST UR: NEGATIVE

## 2014-12-04 LAB — CBC WITH DIFFERENTIAL/PLATELET
Basophils Absolute: 0 10*3/uL (ref 0.0–0.1)
Basophils Relative: 0 % (ref 0–1)
Eosinophils Absolute: 0.1 10*3/uL (ref 0.0–0.7)
Eosinophils Relative: 1 % (ref 0–5)
HCT: 42.1 % (ref 36.0–46.0)
Hemoglobin: 15.4 g/dL — ABNORMAL HIGH (ref 12.0–15.0)
LYMPHS PCT: 23 % (ref 12–46)
Lymphs Abs: 2.5 10*3/uL (ref 0.7–4.0)
MCH: 29.5 pg (ref 26.0–34.0)
MCHC: 36.6 g/dL — ABNORMAL HIGH (ref 30.0–36.0)
MCV: 80.7 fL (ref 78.0–100.0)
Monocytes Absolute: 0.6 10*3/uL (ref 0.1–1.0)
Monocytes Relative: 5 % (ref 3–12)
Neutro Abs: 7.6 10*3/uL (ref 1.7–7.7)
Neutrophils Relative %: 71 % (ref 43–77)
PLATELETS: 276 10*3/uL (ref 150–400)
RBC: 5.22 MIL/uL — ABNORMAL HIGH (ref 3.87–5.11)
RDW: 12.8 % (ref 11.5–15.5)
WBC: 10.8 10*3/uL — AB (ref 4.0–10.5)

## 2014-12-04 LAB — LIPASE, BLOOD: Lipase: 16 U/L — ABNORMAL LOW (ref 22–51)

## 2014-12-04 MED ORDER — CIPROFLOXACIN HCL 750 MG PO TABS: 750.0000 mg | ORAL_TABLET | Freq: Every day | ORAL | Status: DC

## 2014-12-04 MED ORDER — METHYLPREDNISOLONE SODIUM SUCC 125 MG IJ SOLR
125.0000 mg | Freq: Once | INTRAMUSCULAR | Status: AC
  Administered 2014-12-04: 125 mg via INTRAVENOUS
  Filled 2014-12-04: qty 2

## 2014-12-04 MED ORDER — PREDNISONE 20 MG PO TABS: 40.0000 mg | ORAL_TABLET | Freq: Every day | ORAL | Status: DC

## 2014-12-04 MED ORDER — CIPROFLOXACIN HCL 500 MG PO TABS
750.0000 mg | ORAL_TABLET | Freq: Once | ORAL | Status: AC
  Administered 2014-12-04: 750 mg via ORAL
  Filled 2014-12-04: qty 2

## 2014-12-04 MED ORDER — METRONIDAZOLE 500 MG PO TABS: 500.0000 mg | ORAL_TABLET | Freq: Two times a day (BID) | ORAL | Status: DC

## 2014-12-04 MED ORDER — METRONIDAZOLE 500 MG PO TABS
500.0000 mg | ORAL_TABLET | Freq: Once | ORAL | Status: AC
  Administered 2014-12-04: 500 mg via ORAL
  Filled 2014-12-04: qty 1

## 2014-12-04 MED ORDER — ONDANSETRON 4 MG PO TBDP
ORAL_TABLET | ORAL | Status: AC
  Filled 2014-12-04: qty 2

## 2014-12-04 MED ORDER — FAMOTIDINE IN NACL 20-0.9 MG/50ML-% IV SOLN
20.0000 mg | Freq: Once | INTRAVENOUS | Status: AC
  Administered 2014-12-04: 20 mg via INTRAVENOUS
  Filled 2014-12-04: qty 50

## 2014-12-04 MED ORDER — IOHEXOL 300 MG/ML  SOLN
100.0000 mL | Freq: Once | INTRAMUSCULAR | Status: AC | PRN
  Administered 2014-12-04: 100 mL via INTRAVENOUS

## 2014-12-04 MED ORDER — IOHEXOL 300 MG/ML  SOLN
25.0000 mL | Freq: Once | INTRAMUSCULAR | Status: AC | PRN
  Administered 2014-12-04: 25 mL via ORAL

## 2014-12-04 MED ORDER — ONDANSETRON 4 MG PO TBDP
8.0000 mg | ORAL_TABLET | Freq: Once | ORAL | Status: AC
  Administered 2014-12-04: 8 mg via ORAL

## 2014-12-04 MED ORDER — HYDROMORPHONE HCL 1 MG/ML IJ SOLN
0.5000 mg | Freq: Once | INTRAMUSCULAR | Status: AC
  Administered 2014-12-04: 0.5 mg via INTRAVENOUS
  Filled 2014-12-04: qty 1

## 2014-12-04 MED ORDER — ONDANSETRON HCL 4 MG/2ML IJ SOLN
4.0000 mg | Freq: Once | INTRAMUSCULAR | Status: AC
  Administered 2014-12-04: 4 mg via INTRAVENOUS
  Filled 2014-12-04: qty 2

## 2014-12-04 MED ORDER — SODIUM CHLORIDE 0.9 % IV SOLN
Freq: Once | INTRAVENOUS | Status: AC
  Administered 2014-12-04: 02:00:00 via INTRAVENOUS

## 2014-12-04 MED ORDER — HYDROCODONE-ACETAMINOPHEN 5-325 MG PO TABS: 1.0000 | ORAL_TABLET | Freq: Four times a day (QID) | ORAL | Status: DC | PRN

## 2014-12-04 NOTE — ED Provider Notes (Signed)
CSN: [id number]     Arrival date & time 12/03/14  2326 History   First MD Initiated Contact with Patient 12/04/14 0102     Chief Complaint  Patient presents with  . Abdominal Pain     (Consider location/radiation/quality/duration/timing/severity/associated sxs/prior Treatment) HPI Comments: The 74s 39-year-old female with a history of IBS who reports that she's had one day of abdominal discomfort and cramping.  Tonight, the cramping got worse, associated with loose stools. She had similar episode back in March at which time her labs were all normal.  No imaging was done.  She did follow-up with her gastroenterologist did an endoscopy and found no contributing factors and was told if she developed the pain again to return to the emergency department for further evaluation. She denies any recent febrile illnesses, reports a normal menstrual cycle approximately 2 weeks ago, she currently takes birth control and states she has not missed any pills  Patient is a 39 y.o. female presenting with abdominal pain. The history is provided by the patient.  Abdominal Pain Pain location:  Epigastric and periumbilical Pain quality: aching and cramping   Pain severity:  Severe Onset quality:  Sudden Timing:  Intermittent Progression:  Waxing and waning Chronicity:  Recurrent Relieved by:  Nothing Worsened by:  Nothing tried Ineffective treatments:  None tried Associated symptoms: diarrhea and nausea   Associated symptoms: no chest pain, no chills, no constipation, no dysuria, no fever, no shortness of breath and no vomiting     Past Medical History  Diagnosis Date  . IBS (irritable bowel syndrome)   . Migraine   . GERD (gastroesophageal reflux disease)   . HSV-2 infection     2008  . IC (interstitial cystitis)     2008  . Pregnancy induced hypertension    Past Surgical History  Procedure Laterality Date  . Shoulder arthroscopy    . Breast surgery  2002    right breast biopsy Benign  .  Tonsillectomy    . Knee arthroscopy  3/11, 2012    right knee  . Wisdom tooth extraction    . Cesarean section N/A 08/30/2013    Procedure: CESAREAN SECTION;  Surgeon: Marvene Staff, MD;  Location: [facility];  Service: Obstetrics;  Laterality: N/A;   Family History  Problem Relation Age of Onset  . Diabetes Mother   . Hyperlipidemia Mother   . Anemia Mother   . Thyroid disease Sister   . Diabetes Maternal Grandmother   . Diabetes Paternal Grandmother   . Stroke Paternal Grandmother   . Renal Disease Paternal Grandfather   . Heart disease Paternal Grandfather    History  Substance Use Topics  . Smoking status: Never Smoker   . Smokeless tobacco: Never Used  . Alcohol Use: No     Comment: socially, not while pregnant   OB History    Gravida Para Term Preterm AB TAB SAB Ectopic Multiple Living   1 1 1       1      Review of Systems  Constitutional: Negative for fever and chills.  Respiratory: Negative for shortness of breath.   Cardiovascular: Negative for chest pain.  Gastrointestinal: Positive for nausea, abdominal pain and diarrhea. Negative for vomiting, constipation and abdominal distention.  Genitourinary: Negative for dysuria.  Neurological: Negative for dizziness.  All other systems reviewed and are negative.     Allergies  Adhesive and Latex  Home Medications   Prior to Admission medications   Medication Sig Start Date  End Date Taking? Authorizing Provider  acetaminophen (TYLENOL) 325 MG tablet Take 650 mg by mouth every 6 (six) hours as needed for headache.    Historical Provider, MD  ciprofloxacin (CIPRO) 750 MG tablet Take 1 tablet (750 mg total) by mouth daily with breakfast. 12/04/14   Junius Creamer, NP  fluticasone [facility]) 50 MCG/ACT nasal spray Place 2 sprays into both nostrils 2 (two) times daily as needed for allergies.     Historical Provider, MD  hydrALAZINE (APRESOLINE) 10 MG tablet Take 1 tablet (10 mg total) by mouth every 6 (six)  hours. Patient not taking: Reported on 08/17/2014 09/25/13   Graciela Husbands, CNM  HYDROcodone-acetaminophen (NORCO/VICODIN) 5-325 MG per tablet Take 1 tablet by mouth every 6 (six) hours as needed for severe pain. 12/04/14   Junius Creamer, NP  ibuprofen (ADVIL,MOTRIN) 800 MG tablet Take 1 tablet (800 mg total) by mouth every 6 (six) hours. Patient not taking: Reported on 08/17/2014 09/02/13   Artelia Laroche, CNM  iron polysaccharides (NIFEREX) 150 MG capsule Take 1 capsule (150 mg total) by mouth daily. Patient not taking: Reported on 08/17/2014 09/02/13   Artelia Laroche, CNM  lisinopril-hydrochlorothiazide (PRINZIDE,ZESTORETIC) 20-25 MG per tablet Take 1 tablet by mouth daily.    Historical Provider, MD  magnesium oxide (MAG-OX) 400 (241.3 MG) MG tablet Take 1 tablet (400 mg total) by mouth daily. Patient not taking: Reported on 08/17/2014 09/02/13   Artelia Laroche, CNM  metroNIDAZOLE (FLAGYL) 500 MG tablet Take 1 tablet (500 mg total) by mouth 2 (two) times daily. 12/04/14   Junius Creamer, NP  norethindrone-ethinyl estradiol 1/35 (ORTHO-NOVUM, NORTREL,CYCLAFEM) tablet Take 1 tablet by mouth daily.    Historical Provider, MD  omeprazole (PRILOSEC) 20 MG capsule Take 1 capsule (20 mg total) by mouth daily. 08/17/14   Davonna Belling, MD  ondansetron (ZOFRAN) 4 MG tablet Take 1 tablet (4 mg total) by mouth every 6 (six) hours. 08/17/14   Davonna Belling, MD  oxyCODONE-acetaminophen (PERCOCET/ROXICET) 5-325 MG per tablet Take 2 tablets by mouth every 4 (four) hours as needed for severe pain. 08/17/14   Davonna Belling, MD  Polyethyl Glycol-Propyl Glycol (SYSTANE OP) Apply 2 drops to eye daily as needed (dry eyes/contacts.).    Historical Provider, MD  predniSONE (DELTASONE) 20 MG tablet Take 2 tablets (40 mg total) by mouth daily with breakfast. 12/04/14   Junius Creamer, NP  Prenatal Vit-Fe Fumarate-FA (PRENATAL MULTIVITAMIN) TABS tablet Take 1 tablet by mouth daily at 12 noon.    Historical Provider, MD  ranitidine (ZANTAC)  150 MG tablet Take 1 tablet (150 mg total) by mouth 2 (two) times daily. Patient not taking: Reported on 08/17/2014 03/16/13   Megan Salon, MD  valACYclovir (VALTREX) 500 MG tablet Take 1 tablet (500 mg total) by mouth 2 (two) times daily. 11/26/13   Kem Boroughs, FNP   BP 118/68 mmHg  Pulse 94  Temp(Src) 98 F (36.7 C) (Oral)  Resp 15  SpO2 100%  LMP 11/19/2014 (Approximate)  Breastfeeding? No Physical Exam  Constitutional: She appears well-developed and well-nourished. She appears distressed.  HENT:  Head: Normocephalic.  Eyes: Pupils are equal, round, and reactive to light.  Neck: Normal range of motion.  Cardiovascular: Normal rate and regular rhythm.   Pulmonary/Chest: Effort normal and breath sounds normal.  Abdominal: Soft. She exhibits no distension. There is no hepatosplenomegaly. There is tenderness in the epigastric area. There is no rebound.    Musculoskeletal: Normal range of motion.  Neurological: She is alert.  Skin:  Skin is warm and dry. No rash noted. No erythema.  Nursing note and vitals reviewed.   ED Course  Procedures (including critical care time) Labs Review Labs Reviewed  CBC WITH DIFFERENTIAL/PLATELET - Abnormal; Notable for the following:    WBC 10.8 (*)    RBC 5.22 (*)    Hemoglobin 15.4 (*)    MCHC 36.6 (*)    All other components within normal limits  COMPREHENSIVE METABOLIC PANEL - Abnormal; Notable for the following:    Chloride 97 (*)    Glucose, Bld 100 (*)    Total Bilirubin 1.9 (*)    All other components within normal limits  LIPASE, BLOOD - Abnormal; Notable for the following:    Lipase 16 (*)    All other components within normal limits  URINALYSIS, ROUTINE W REFLEX MICROSCOPIC (NOT AT [facility]) - Abnormal; Notable for the following:    Ketones, ur 15 (*)    All other components within normal limits  POC URINE PREG, ED    Imaging Review Ct Abdomen Pelvis W Contrast  12/04/2014   CLINICAL DATA:  Epigastric pain, multiple episodes  over months but worse tonight.  EXAM: CT ABDOMEN AND PELVIS WITH CONTRAST  TECHNIQUE: Multidetector CT imaging of the abdomen and pelvis was performed using the standard protocol following bolus administration of intravenous contrast.  CONTRAST:  172mL OMNIPAQUE IOHEXOL 300 MG/ML  SOLN  COMPARISON:  None 05/2011  FINDINGS: BODY WALL: No contributory findings.  LOWER CHEST: No contributory findings.  ABDOMEN/PELVIS:  Liver: No focal abnormality.  Biliary: No evidence of biliary obstruction or stone.  Pancreas: Unremarkable.  Spleen: Unremarkable.  Adrenals: Unremarkable.  Kidneys and ureters: No hydronephrosis or stone.  Bladder: Unremarkable.  Reproductive: No pathologic findings.  Bowel: There is severe a thickening of distal small bowel loops from submucosal edema. Some skip areas of inflammation or also seen, with wall thickening near the ligament of Treitz and in left abdominal jejunal segments. The peri pyloric region also appears thickened. Possible mild mid transverse colon involvement. No terminal ileum involvement. No bowel obstruction. No appendicitis.  Retroperitoneum: Reactive appearing enlargement of mesenteric lymph nodes.  Peritoneum: Reactive ascites mainly in the pelvis.  Vascular: No acute abnormality.  OSSEOUS: No sacroiliitis.  IMPRESSION: Prominent enteritis. Although usually infectious, Crohn's (there are skip areas of inflammation) and autoimmune enteritis should also be considered.   Electronically Signed   By: Monte Fantasia M.D.   On: 12/04/2014 02:56     EKG Interpretation None      MDM   Final diagnoses:  Epigastric pain  Enteritis        Junius Creamer, NP 12/04/14 4818  Everlene Balls, MD 12/04/14 [phone]

## 2014-12-04 NOTE — ED Notes (Signed)
Denise [name] at bedside

## 2014-12-04 NOTE — Discharge Instructions (Signed)
Your CT scan, shows that you have patchy inflammation of your intestines.  You have been given 2 and a biotics to take as well as a steroid-dependent to help heal this infection.  Please follow-up with your gastroenterologist for further evaluation of potential Crohn's disease.  Due to the appearance on your CT scan.  You have been given a prescription for Vicodin that you can use for severe pain.  Otherwise, please use Tylenol

## 2015-01-24 ENCOUNTER — Ambulatory Visit: Payer: Self-pay | Admitting: Nurse Practitioner

## 2015-01-26 ENCOUNTER — Ambulatory Visit (INDEPENDENT_AMBULATORY_CARE_PROVIDER_SITE_OTHER): Payer: Medicaid Other | Admitting: Nurse Practitioner

## 2015-01-26 ENCOUNTER — Encounter: Payer: Self-pay | Admitting: Nurse Practitioner

## 2015-01-26 VITALS — BP 124/80 | HR 80 | Ht 63.5 in | Wt 161.0 lb

## 2015-01-26 DIAGNOSIS — Z01419 Encounter for gynecological examination (general) (routine) without abnormal findings: Secondary | ICD-10-CM

## 2015-01-26 DIAGNOSIS — Z0000 Encounter for general adult medical examination without abnormal findings: Secondary | ICD-10-CM

## 2015-01-26 DIAGNOSIS — I10 Essential (primary) hypertension: Secondary | ICD-10-CM

## 2015-01-26 MED ORDER — VALACYCLOVIR HCL 500 MG PO TABS: 500.0000 mg | ORAL_TABLET | Freq: Two times a day (BID) | ORAL | Status: DC

## 2015-01-26 MED ORDER — NORETHINDRONE 0.35 MG PO TABS: 1.0000 | ORAL_TABLET | Freq: Every day | ORAL | Status: DC

## 2015-01-26 NOTE — Patient Instructions (Signed)

## 2015-01-26 NOTE — Progress Notes (Signed)
Patient ID: Denise [name], female   DOB: [birth year], 39 y.o.   MRN: [id number] 39 y.o. G1P1001 Single  African American Fe here for annual exam.  Same partner for 10 yrs. She now has a son born 08/30/2013.  During pregnancy developed HTN.  She was on Micronor after birth of baby due to breast feeding.  Now back on OCP since end of January.  Now BP is up and down and now new BP med's changed last week.  She wants to have another child in the near future.  Patient's last menstrual period was 01/12/2015 (exact date).          Sexually active: Yes.    The current method of family planning is OCP (estrogen/progesterone).    Exercising: No.  The patient does not participate in regular exercise at present. Smoker:  no  Health Maintenance: Pap:  02/26/13, negative with neg HR HPV (? 10/2013 at post partum visit, but unsure) TDaP:  2015 with pregnancy Labs:  PCP   reports that she has never smoked. She has never used smokeless tobacco. She reports that she does not drink alcohol or use illicit drugs.  Past Medical History  Diagnosis Date  . IBS (irritable bowel syndrome)   . Migraine   . GERD (gastroesophageal reflux disease)   . HSV-2 infection     2008  . IC (interstitial cystitis)     2008  . Pregnancy induced hypertension     Past Surgical History  Procedure Laterality Date  . Shoulder arthroscopy    . Breast surgery  2002    right breast biopsy Benign  . Tonsillectomy    . Knee arthroscopy  3/11, 2012    right knee  . Wisdom tooth extraction    . Cesarean section N/A 08/30/2013    Procedure: CESAREAN SECTION;  Surgeon: Marvene Staff, MD;  Location: [facility];  Service: Obstetrics;  Laterality: N/A;    Current Outpatient Prescriptions  Medication Sig Dispense Refill  . acetaminophen (TYLENOL) 325 MG tablet Take 650 mg by mouth every 6 (six) hours as needed for headache.    . fluticasone (FLONASE) 50 MCG/ACT nasal spray Place 2 sprays into both nostrils 2 (two) times daily as needed  for allergies.     Marland Kitchen omeprazole (PRILOSEC) 20 MG capsule Take 1 capsule (20 mg total) by mouth daily. 20 capsule 0  . Prenatal Vit-Fe Fumarate-FA (PRENATAL MULTIVITAMIN) TABS tablet Take 1 tablet by mouth daily at 12 noon.    . ranitidine (ZANTAC) 150 MG tablet Take 1 tablet (150 mg total) by mouth 2 (two) times daily. 60 tablet 2  . telmisartan (MICARDIS) 20 MG tablet Take 20 mg by mouth daily.  4  . valACYclovir (VALTREX) 500 MG tablet Take 1 tablet (500 mg total) by mouth 2 (two) times daily. 180 tablet 3  . norethindrone (MICRONOR,CAMILA,ERRIN) 0.35 MG tablet Take 1 tablet (0.35 mg total) by mouth daily. 3 Package 3   No current facility-administered medications for this visit.    Family History  Problem Relation Age of Onset  . Diabetes Mother   . Hyperlipidemia Mother   . Anemia Mother   . Diabetes Maternal Grandmother   . Heart failure Maternal Grandmother   . Diabetes Paternal Grandmother   . Stroke Paternal Grandmother   . Renal Disease Paternal Grandfather   . Heart disease Paternal Grandfather   . Stroke Paternal Grandfather   . Hyperlipidemia Father   . Hypertension Father   . Heart disease Father   .  Heart disease Brother   . Anemia Maternal Aunt   . Prostate cancer Maternal Grandfather   . Hypertension Sister   . Hypertension Sister   . Thyroid disease Sister     ROS:  Pertinent items are noted in HPI.  Otherwise, a comprehensive ROS was negative.  Exam:   BP 124/80 mmHg  Pulse 80  Ht 5' 3.5" (1.613 m)  Wt 161 lb (73.029 kg)  BMI 28.07 kg/m2  LMP 01/12/2015 (Exact Date)  Breastfeeding? No Height: 5' 3.5" (161.3 cm) Ht Readings from Last 3 Encounters:  01/26/15 5' 3.5" (1.613 m)  08/29/13 5\' 3"  (1.6 m)  08/22/13 5\' 4"  (1.626 m)    General appearance: alert, cooperative and appears stated age Head: Normocephalic, without obvious abnormality, atraumatic Neck: no adenopathy, supple, symmetrical, trachea midline and thyroid normal to inspection and  palpation Lungs: clear to auscultation bilaterally Breasts: normal appearance, no masses or tenderness Heart: regular rate and rhythm Abdomen: soft, non-tender; no masses,  no organomegaly Extremities: extremities normal, atraumatic, no cyanosis or edema Skin: Skin color, texture, turgor normal. No rashes or lesions Lymph nodes: Cervical, supraclavicular, and axillary nodes normal. No abnormal inguinal nodes palpated Neurologic: Grossly normal   Pelvic: External genitalia:  no lesions              Urethra:  normal appearing urethra with no masses, tenderness or lesions              Bartholin's and Skene's: normal                 Vagina: normal appearing vagina with normal color and discharge, no lesions              Cervix: anteverted              Pap taken: Yes.   Bimanual Exam:  Uterus:  normal size, contour, position, consistency, mobility, non-tender              Adnexa: no mass, fullness, tenderness               Rectovaginal: Confirms               Anus:  normal sphincter tone, no lesions  Chaperone present: yes  A:  Well Woman with normal exam  New history of HTN with pregnancy - and not yet well controlled  Currently on OCP 35 mcg.  Change to POP  P:   Reviewed health and wellness pertinent to exam  Pap smear as above  Will change Ortho Tri cyclen to POP and rationale is given,  and will follow  She will continue to follow with PCP about BP  Counseled on breast self exam, use and side effects of OCP's, adequate intake of calcium and vitamin D, diet and exercise return annually or prn  An After Visit Summary was printed and given to the patient.

## 2015-01-30 NOTE — Progress Notes (Signed)
Encounter reviewed by Dr. Brook Amundson C. Silva.  

## 2015-02-01 LAB — IPS PAP TEST WITH HPV

## 2015-05-03 ENCOUNTER — Encounter: Payer: Self-pay | Admitting: Nurse Practitioner

## 2015-05-03 ENCOUNTER — Ambulatory Visit (INDEPENDENT_AMBULATORY_CARE_PROVIDER_SITE_OTHER): Payer: Medicaid Other | Admitting: Nurse Practitioner

## 2015-05-03 VITALS — BP 110/74 | HR 84 | Temp 98.7°F | Resp 18 | Ht 63.5 in | Wt 162.2 lb

## 2015-05-03 DIAGNOSIS — L723 Sebaceous cyst: Secondary | ICD-10-CM | POA: Diagnosis not present

## 2015-05-03 MED ORDER — CEPHALEXIN 500 MG PO CAPS: 500.0000 mg | ORAL_CAPSULE | Freq: Two times a day (BID) | ORAL | Status: DC

## 2015-05-03 MED ORDER — FLUCONAZOLE 150 MG PO TABS: 150.0000 mg | ORAL_TABLET | Freq: Once | ORAL | Status: DC

## 2015-05-03 NOTE — Patient Instructions (Signed)
Epidermal Cyst An epidermal cyst is sometimes called a sebaceous cyst, epidermal inclusion cyst, or infundibular cyst. These cysts usually contain a substance that looks "pasty" or "cheesy" and may have a bad smell. This substance is a protein called keratin. Epidermal cysts are usually found on the face, neck, or trunk. They may also occur in the vaginal area or other parts of the genitalia of both men and women. Epidermal cysts are usually small, painless, slow-growing bumps or lumps that move freely under the skin. It is important not to try to pop them. This may cause an infection and lead to tenderness and swelling. CAUSES  Epidermal cysts may be caused by a deep penetrating injury to the skin or a plugged hair follicle, often associated with acne. SYMPTOMS  Epidermal cysts can become inflamed and cause:  Redness.  Tenderness.  Increased temperature of the skin over the bumps or lumps.  Grayish-white, bad smelling material that drains from the bump or lump. DIAGNOSIS  Epidermal cysts are easily diagnosed by your caregiver during an exam. Rarely, a tissue sample (biopsy) may be taken to rule out other conditions that may resemble epidermal cysts. TREATMENT   Epidermal cysts often get better and disappear on their own. They are rarely ever cancerous.  If a cyst becomes infected, it may become inflamed and tender. This may require opening and draining the cyst. Treatment with antibiotics may be necessary. When the infection is gone, the cyst may be removed with minor surgery.  Small, inflamed cysts can often be treated with antibiotics or by injecting steroid medicines.  Sometimes, epidermal cysts become large and bothersome. If this happens, surgical removal in your caregiver's office may be necessary. HOME CARE INSTRUCTIONS  Only take over-the-counter or prescription medicines as directed by your caregiver.  Take your antibiotics as directed. Finish them even if you start to feel  better. SEEK MEDICAL CARE IF:   Your cyst becomes tender, red, or swollen.  Your condition is not improving or is getting worse.  You have any other questions or concerns. MAKE SURE YOU:  Understand these instructions.  Will watch your condition.  Will get help right away if you are not doing well or get worse.   This information is not intended to replace advice given to you by your health care provider. Make sure you discuss any questions you have with your health care provider.   Document Released: 04/14/2004 Document Revised: 08/06/2011 Document Reviewed: 11/20/2010 Elsevier Interactive Patient Education 2016 [employer] Inc[employer]  

## 2015-05-03 NOTE — Progress Notes (Signed)
Subjective:     Patient ID: Denise [name], female   DOB: 02/26/1976, 39 y.o.   MRN: [id number]  HPI   This 39 yo single AA female G1P1 presents with complaints of pain under left arm since yesterday while applying deodorant.  Last pm felt more painful.  Last time of shaving was 1 week earlier.  No other history of trauma.  No bites.  No breast tenderness or mass.  LMP was 04/24/15.  On Micronor with missed pill a few weeks ago.  She feels that she had a 'bump'.  She is scheduled for a repeat Mammo and Breast US on 05/14/15.  Review of Systems  Constitutional: Negative.   HENT: Negative.   Respiratory: Negative.   Cardiovascular: Negative.   Gastrointestinal: Negative.   Genitourinary: Negative.   Musculoskeletal: Negative.   Skin: Positive for wound.  Neurological: Negative.   Psychiatric/Behavioral: Negative.        Objective:   Physical Exam  Constitutional: She is oriented to person, place, and time. She appears well-developed and well-nourished. No distress.  Neck: Normal range of motion. Neck supple. No thyromegaly present.  Cardiovascular: Normal rate.   Pulmonary/Chest: Effort normal.  Abdominal: Soft.  Musculoskeletal: Normal range of motion.  Lymphadenopathy:    She has no cervical adenopathy.  Neurological: She is alert and oriented to person, place, and time.  Skin: Skin is warm and dry.  Left axilla with a small 5 mm sebaceous cyst that is tender.  No exudate and no warmth.  No lymphadenopathy.  Psychiatric: She has a normal mood and affect. Her behavior is normal. Judgment and thought content normal.       Assessment:     Left axilla sebaceous cyst     Plan:     Will do warm compresses and take Keflex 500 mg BID for 5-7 days.   If symptoms still there at time of Mammogram - may get [place] of that area. She is to call back if not better Will follow Keflex with Diflucan as she is prone to yeast vaginitis symptoms.

## 2015-05-04 NOTE — Progress Notes (Signed)
Encounter reviewed Denise Escalona, MD   

## 2015-06-21 ENCOUNTER — Encounter: Payer: Self-pay | Admitting: Nurse Practitioner

## 2015-07-26 ENCOUNTER — Ambulatory Visit: Payer: Medicaid Other | Admitting: Nurse Practitioner

## 2015-07-26 ENCOUNTER — Ambulatory Visit (INDEPENDENT_AMBULATORY_CARE_PROVIDER_SITE_OTHER): Payer: Medicaid Other | Admitting: Obstetrics and Gynecology

## 2015-07-26 ENCOUNTER — Encounter: Payer: Self-pay | Admitting: Obstetrics and Gynecology

## 2015-07-26 VITALS — BP 130/80 | HR 80 | Resp 14 | Wt 164.0 lb

## 2015-07-26 DIAGNOSIS — N760 Acute vaginitis: Secondary | ICD-10-CM

## 2015-07-26 DIAGNOSIS — B009 Herpesviral infection, unspecified: Secondary | ICD-10-CM

## 2015-07-26 DIAGNOSIS — B9689 Other specified bacterial agents as the cause of diseases classified elsewhere: Secondary | ICD-10-CM

## 2015-07-26 DIAGNOSIS — A499 Bacterial infection, unspecified: Secondary | ICD-10-CM

## 2015-07-26 DIAGNOSIS — R350 Frequency of micturition: Secondary | ICD-10-CM | POA: Diagnosis not present

## 2015-07-26 LAB — POCT URINALYSIS DIPSTICK
BILIRUBIN UA: NEGATIVE
GLUCOSE UA: NEGATIVE
KETONES UA: NEGATIVE
LEUKOCYTES UA: NEGATIVE
Nitrite, UA: NEGATIVE
PH UA: 7
Protein, UA: NEGATIVE
RBC UA: NEGATIVE
Urobilinogen, UA: NEGATIVE

## 2015-07-26 MED ORDER — METRONIDAZOLE 500 MG PO TABS: 500.0000 mg | ORAL_TABLET | Freq: Two times a day (BID) | ORAL | Status: DC

## 2015-07-26 NOTE — Patient Instructions (Signed)

## 2015-07-26 NOTE — Progress Notes (Signed)
Patient ID: Denise [name], female   DOB: [birth year], 40 y.o.   MRN: [id number] GYNECOLOGY  VISIT   HPI: 40 y.o.   Single  African American  female   [id number] with Patient's last menstrual period was 07/19/2015.   here c/o vaginal itching and odor. She c/o a one day h/o vulvar pruritus and a vaginal odor. Unsure about d/c. She has an hsv outbreak, the itching is lower down. She has been getting an hsv outbreak every month over the last 6 months. Very irritated. She is on valtrex. She is sexually active, long term partner (10 years). She c/o a couple day h/o urinary frequency, some tingling inside with voiding at the end of voiding. Some urgency to void, voiding normal amounts.   GYNECOLOGIC HISTORY: Patient's last menstrual period was 07/19/2015. Contraception:OCP Menopausal hormone therapy: none         OB History    Gravida Para Term Preterm AB TAB SAB Ectopic Multiple Living   1 1 1  0 0 0 0 0 0 1         Patient Active Problem List   Diagnosis Date Noted  . Postpartum care following cesarean delivery (4/5) 08/30/2013  . PIH (pregnancy induced hypertension) 08/29/2013  . Murmur 06/26/2013  . Palpitations 06/26/2013  . IBS (irritable bowel syndrome)   . Migraine   . GERD (gastroesophageal reflux disease)   . HSV-2 infection   . IC (interstitial cystitis)     Past Medical History  Diagnosis Date  . IBS (irritable bowel syndrome)   . Migraine   . GERD (gastroesophageal reflux disease)   . HSV-2 infection     2008  . IC (interstitial cystitis)     2008  . Pregnancy induced hypertension     Past Surgical History  Procedure Laterality Date  . Shoulder arthroscopy    . Breast surgery  2002    right breast biopsy Benign  . Tonsillectomy    . Knee arthroscopy  3/11, 2012    right knee  . Wisdom tooth extraction    . Cesarean section N/A 08/30/2013    Procedure: CESAREAN SECTION;  Surgeon: Marvene Staff, MD;  Location: [facility];  Service: Obstetrics;  Laterality: N/A;     Current Outpatient Prescriptions  Medication Sig Dispense Refill  . acetaminophen (TYLENOL) 325 MG tablet Take 650 mg by mouth every 6 (six) hours as needed for headache.    . [employer] ALLERGY RELIEF D 60-120 MG 12 hr tablet Take 1 tablet by mouth 2 (two) times daily.  3  . fluticasone (FLONASE) 50 MCG/ACT nasal spray Place 2 sprays into both nostrils 2 (two) times daily as needed for allergies.     Marland Kitchen norethindrone (MICRONOR,CAMILA,ERRIN) 0.35 MG tablet Take 1 tablet (0.35 mg total) by mouth daily. 3 Package 3  . omeprazole (PRILOSEC) 20 MG capsule Take 1 capsule (20 mg total) by mouth daily. 20 capsule 0  . Prenatal Vit-Fe Fumarate-FA (PRENATAL MULTIVITAMIN) TABS tablet Take 1 tablet by mouth daily at 12 noon.    . ranitidine (ZANTAC) 150 MG tablet Take 1 tablet (150 mg total) by mouth 2 (two) times daily. 60 tablet 2  . telmisartan (MICARDIS) 20 MG tablet Take 20 mg by mouth daily.  4  . valACYclovir (VALTREX) 500 MG tablet Take 1 tablet (500 mg total) by mouth 2 (two) times daily. 180 tablet 3   No current facility-administered medications for this visit.     ALLERGIES: Adhesive and Latex  Family History  Problem Relation Age of Onset  . Diabetes Mother   . Hyperlipidemia Mother   . Anemia Mother   . Diabetes Maternal Grandmother   . Heart failure Maternal Grandmother   . Diabetes Paternal Grandmother   . Stroke Paternal Grandmother   . Renal Disease Paternal Grandfather   . Heart disease Paternal Grandfather   . Stroke Paternal Grandfather   . Hyperlipidemia Father   . Hypertension Father   . Heart disease Father   . Heart disease Brother   . Anemia Maternal Aunt   . Prostate cancer Maternal Grandfather   . Hypertension Sister   . Hypertension Sister   . Thyroid disease Sister   . Prostate cancer Paternal Uncle     Social History   Social History  . Marital Status: Single    Spouse Name: N/A  . Number of Children: N/A  . Years of Education: N/A   Occupational  History  . Not on file.   Social History Main Topics  . Smoking status: Never Smoker   . Smokeless tobacco: Never Used  . Alcohol Use: Yes     Comment: socially, not while pregnant  . Drug Use: No     Comment: last use 15 years ago  . Sexual Activity:    Partners: Male    Birth Control/ Protection: OCP, Pill   Other Topics Concern  . Not on file   Social History Narrative    Review of Systems  Constitutional: Negative.   HENT: Negative.   Eyes: Negative.   Respiratory: Negative.   Cardiovascular: Negative.   Gastrointestinal: Negative.   Genitourinary:       Vaginal itching and odor  Musculoskeletal: Negative.   Skin: Negative.   Neurological: Negative.   Endo/Heme/Allergies: Negative.   Psychiatric/Behavioral: Negative.     PHYSICAL EXAMINATION:    BP 130/80 mmHg  Pulse 80  Resp 14  Wt 164 lb (74.39 kg)  LMP 07/19/2015    General appearance: alert, cooperative and appears stated age  Pelvic: External genitalia:  HSV outbreak to the right of the clitoris              Urethra:  normal appearing urethra with no masses, tenderness or lesions              Bartholins and Skenes: normal                 Vagina: normal appearing vagina with normal color and discharge, no lesions              Cervix: no lesions               Wet prep: + clue, no trich, + wbc KOH: no yeast PH: 4.5  Chaperone was present for exam.  ASSESSMENT Bacterial vaginitis Urinary frequency, urgency HSV outbreak, increase in frequency to every month    PLAN Treat with flagyl, avoid ETOH Send urine for ua, c&s Recommend she restart taking her valtrex qd   An After Visit Summary was printed and given to the patient.

## 2015-07-27 LAB — URINALYSIS, MICROSCOPIC ONLY
BACTERIA UA: NONE SEEN [HPF]
CRYSTALS: NONE SEEN [HPF]
Casts: NONE SEEN [LPF]
RBC / HPF: NONE SEEN RBC/HPF (ref <=2)
SQUAMOUS EPITHELIAL / LPF: NONE SEEN [HPF] (ref <=5)
WBC UA: NONE SEEN WBC/HPF (ref <=5)
Yeast: NONE SEEN [HPF]

## 2015-07-27 LAB — URINE CULTURE: Colony Count: 2000

## 2015-09-04 ENCOUNTER — Ambulatory Visit (HOSPITAL_COMMUNITY)
Admission: EM | Admit: 2015-09-04 | Discharge: 2015-09-04 | Disposition: A | Discharge status: Home / Self Care | Payer: Medicaid Other | Attending: Family Medicine | Admitting: Family Medicine

## 2015-09-04 ENCOUNTER — Encounter (HOSPITAL_COMMUNITY): Payer: Self-pay | Admitting: Emergency Medicine

## 2015-09-04 DIAGNOSIS — H109 Unspecified conjunctivitis: Secondary | ICD-10-CM | POA: Diagnosis not present

## 2015-09-04 MED ORDER — OFLOXACIN 0.3 % OP SOLN: 1.0000 [drp] | Freq: Four times a day (QID) | OPHTHALMIC | Status: DC

## 2015-09-04 NOTE — Discharge Instructions (Signed)
Bacterial Conjunctivitis Bacterial conjunctivitis (commonly called pink eye) is redness, soreness, or puffiness (inflammation) of the white part of your eye. It is caused by a germ called bacteria. These germs can easily spread from person to person (contagious). Your eye often will become red or pink. Your eye may also become irritated, watery, or have a thick discharge.  HOME CARE   Apply a cool, clean washcloth over closed eyelids. Do this for 10-20 minutes, 3-4 times a day while you have pain.  Gently wipe away any fluid coming from the eye with a warm, wet washcloth or cotton ball.  Wash your hands often with soap and water. Use paper towels to dry your hands.  Do not share towels or washcloths.  Change or wash your pillowcase every day.  Do not use eye makeup until the infection is gone.  Do not use machines or drive if your vision is blurry.  Stop using contact lenses. Do not use them again until your doctor says it is okay.  Do not touch the tip of the eye drop bottle or medicine tube with your fingers when you put medicine on the eye. GET HELP RIGHT AWAY IF:   Your eye is not better after 3 days of starting your medicine.  You have a yellowish fluid coming out of the eye.  You have more pain in the eye.  Your eye redness is spreading.  Your vision becomes blurry.  You have a fever or lasting symptoms for more than 2-3 days.  You have a fever and your symptoms suddenly get worse.  You have pain in the face.  Your face gets red or puffy (swollen). MAKE SURE YOU:   Understand these instructions.  Will watch this condition.  Will get help right away if you are not doing well or get worse.   This information is not intended to replace advice given to you by your health care provider. Make sure you discuss any questions you have with your health care provider.   Document Released: 02/21/2008 Document Revised: 04/30/2012 Document Reviewed: 01/18/2012 Elsevier  Interactive Patient Education 2016 Elsevier Inc.  

## 2015-09-04 NOTE — ED Notes (Signed)
The patient presented to the [facility] with a complaint of a right eye irritation that started this morning.

## 2015-09-04 NOTE — ED Provider Notes (Signed)
CSN: [id number]     Arrival date & time 09/04/15  1936 History   First MD Initiated Contact with Patient 09/04/15 2024     Chief Complaint  Patient presents with  . Conjunctivitis   (Consider location/radiation/quality/duration/timing/severity/associated sxs/prior Treatment) Patient is a 40 y.o. female presenting with conjunctivitis. The history is provided by the patient. No language interpreter was used.  Conjunctivitis This is a new problem. Episode onset: Right red eye started this morning. The problem occurs constantly. The problem has been gradually worsening. Associated symptoms comments: Yellowish discharge, denies eye pain but irritated. She also endorse itching. Denies sick contact.. Nothing aggravates the symptoms. Nothing relieves the symptoms. She has tried nothing for the symptoms.    Past Medical History  Diagnosis Date  . IBS (irritable bowel syndrome)   . Migraine   . GERD (gastroesophageal reflux disease)   . HSV-2 infection     2008  . IC (interstitial cystitis)     2008  . Pregnancy induced hypertension    Past Surgical History  Procedure Laterality Date  . Shoulder arthroscopy    . Breast surgery  2002    right breast biopsy Benign  . Tonsillectomy    . Knee arthroscopy  3/11, 2012    right knee  . Wisdom tooth extraction    . Cesarean section N/A 08/30/2013    Procedure: CESAREAN SECTION;  Surgeon: Marvene Staff, MD;  Location: [facility];  Service: Obstetrics;  Laterality: N/A;   Family History  Problem Relation Age of Onset  . Diabetes Mother   . Hyperlipidemia Mother   . Anemia Mother   . Diabetes Maternal Grandmother   . Heart failure Maternal Grandmother   . Diabetes Paternal Grandmother   . Stroke Paternal Grandmother   . Renal Disease Paternal Grandfather   . Heart disease Paternal Grandfather   . Stroke Paternal Grandfather   . Hyperlipidemia Father   . Hypertension Father   . Heart disease Father   . Heart disease Brother   . Anemia  Maternal Aunt   . Prostate cancer Maternal Grandfather   . Hypertension Sister   . Hypertension Sister   . Thyroid disease Sister   . Prostate cancer Paternal Uncle    Social History  Substance Use Topics  . Smoking status: Never Smoker   . Smokeless tobacco: Never Used  . Alcohol Use: Yes     Comment: socially, not while pregnant   OB History    Gravida Para Term Preterm AB TAB SAB Ectopic Multiple Living   1 1 1  0 0 0 0 0 0 1     Review of Systems  HENT:       Red eye  Respiratory: Negative.   Cardiovascular: Negative.     Allergies  Adhesive and Latex  Home Medications   Prior to Admission medications   Medication Sig Start Date End Date Taking? Authorizing Provider  norethindrone (MICRONOR,CAMILA,ERRIN) 0.35 MG tablet Take 1 tablet (0.35 mg total) by mouth daily. 01/26/15  Yes Kem Boroughs, FNP  telmisartan (MICARDIS) 20 MG tablet Take 20 mg by mouth daily. 01/17/15  Yes Historical Provider, MD  acetaminophen (TYLENOL) 325 MG tablet Take 650 mg by mouth every 6 (six) hours as needed for headache.    Historical Provider, MD  [employer]S ALLERGY RELIEF D 60-120 MG 12 hr tablet Take 1 tablet by mouth 2 (two) times daily. 03/05/15   Historical Provider, MD  fluticasone (FLONASE) 50 MCG/ACT nasal spray Place 2 sprays into both  nostrils 2 (two) times daily as needed for allergies.     Historical Provider, MD  metroNIDAZOLE (FLAGYL) 500 MG tablet Take 1 tablet (500 mg total) by mouth 2 (two) times daily. 07/26/15   Salvadore Dom, MD  omeprazole (PRILOSEC) 20 MG capsule Take 1 capsule (20 mg total) by mouth daily. 08/17/14   Davonna Belling, MD  Prenatal Vit-Fe Fumarate-FA (PRENATAL MULTIVITAMIN) TABS tablet Take 1 tablet by mouth daily at 12 noon.    Historical Provider, MD  ranitidine (ZANTAC) 150 MG tablet Take 1 tablet (150 mg total) by mouth 2 (two) times daily. 03/16/13   Megan Salon, MD  valACYclovir (VALTREX) 500 MG tablet Take 1 tablet (500 mg total) by mouth 2 (two)  times daily. 01/26/15   Kem Boroughs, FNP   Meds Ordered and Administered this Visit  Medications - No data to display  BP 118/85 mmHg  Pulse 75  Temp(Src) 98.4 F (36.9 C) (Oral)  Resp 18  SpO2 99%  LMP 09/03/2015 (Exact Date) No data found.   Physical Exam  Constitutional: She appears well-developed. No distress.  HENT:  Head: Normocephalic.  Right Ear: Tympanic membrane normal.  Left Ear: Tympanic membrane normal.  Mouth/Throat: Uvula is midline, oropharynx is clear and moist and mucous membranes are normal.  Eyes: EOM and lids are normal. Pupils are equal, round, and reactive to light. Right conjunctiva is injected. Right conjunctiva has no hemorrhage. Left conjunctiva is not injected. Left conjunctiva has no hemorrhage. No scleral icterus. Pupils are equal.  Cardiovascular: Normal rate, regular rhythm, normal heart sounds and intact distal pulses.   No murmur heard. Pulmonary/Chest: Effort normal and breath sounds normal. No respiratory distress. She has no wheezes.  Nursing note and vitals reviewed.   ED Course  Procedures (including critical care time)  Labs Review Labs Reviewed - No data to display  Imaging Review No results found.   Visual Acuity Review  Right Eye Distance: 20/30 (with corrective lenses) Left Eye Distance: 20/25 (with corrective lenses) Bilateral Distance: 20/25 (with corrective lenses)  Right Eye Near:   Left Eye Near:    Bilateral Near:         MDM  No diagnosis found. Conjunctivitis of right eye  Likely viral however she stated she is starting to have symptoms on her left eye as well. A/B prescribed to prevent superimposed bacterial infection. Return precaution discussed.    Kinnie Feil, MD 09/04/15 2039

## 2015-11-15 ENCOUNTER — Emergency Department (HOSPITAL_COMMUNITY)
Admission: EM | Admit: 2015-11-15 | Discharge: 2015-11-15 | Disposition: A | Discharge status: Home / Self Care | Payer: Medicaid Other | Attending: Emergency Medicine | Admitting: Emergency Medicine

## 2015-11-15 ENCOUNTER — Encounter (HOSPITAL_COMMUNITY): Payer: Self-pay | Admitting: Family Medicine

## 2015-11-15 DIAGNOSIS — T2010XA Burn of first degree of head, face, and neck, unspecified site, initial encounter: Secondary | ICD-10-CM

## 2015-11-15 DIAGNOSIS — T22111A Burn of first degree of right forearm, initial encounter: Secondary | ICD-10-CM | POA: Diagnosis not present

## 2015-11-15 DIAGNOSIS — Z79899 Other long term (current) drug therapy: Secondary | ICD-10-CM | POA: Insufficient documentation

## 2015-11-15 DIAGNOSIS — X12XXXA Contact with other hot fluids, initial encounter: Secondary | ICD-10-CM | POA: Diagnosis not present

## 2015-11-15 DIAGNOSIS — T2210XA Burn of first degree of shoulder and upper limb, except wrist and hand, unspecified site, initial encounter: Secondary | ICD-10-CM

## 2015-11-15 DIAGNOSIS — Y999 Unspecified external cause status: Secondary | ICD-10-CM | POA: Diagnosis not present

## 2015-11-15 DIAGNOSIS — Y929 Unspecified place or not applicable: Secondary | ICD-10-CM | POA: Insufficient documentation

## 2015-11-15 DIAGNOSIS — Z91040 Latex allergy status: Secondary | ICD-10-CM | POA: Insufficient documentation

## 2015-11-15 DIAGNOSIS — Y93G3 Activity, cooking and baking: Secondary | ICD-10-CM | POA: Insufficient documentation

## 2015-11-15 MED ORDER — FLUORESCEIN SODIUM 1 MG OP STRP
1.0000 | ORAL_STRIP | Freq: Once | OPHTHALMIC | Status: AC
  Administered 2015-11-15: 1 via OPHTHALMIC
  Filled 2015-11-15: qty 1

## 2015-11-15 MED ORDER — SILVER SULFADIAZINE 1 % EX CREA
TOPICAL_CREAM | Freq: Once | CUTANEOUS | Status: AC
  Administered 2015-11-15: 17:00:00 via TOPICAL
  Filled 2015-11-15: qty 85

## 2015-11-15 MED ORDER — IBUPROFEN 400 MG PO TABS
800.0000 mg | ORAL_TABLET | Freq: Once | ORAL | Status: AC
  Administered 2015-11-15: 800 mg via ORAL
  Filled 2015-11-15: qty 2

## 2015-11-15 MED ORDER — ERYTHROMYCIN 5 MG/GM OP OINT: TOPICAL_OINTMENT | OPHTHALMIC | Status: DC

## 2015-11-15 MED ORDER — IBUPROFEN 600 MG PO TABS: 600.0000 mg | ORAL_TABLET | Freq: Four times a day (QID) | ORAL | Status: DC | PRN

## 2015-11-15 MED ORDER — TETRACAINE HCL 0.5 % OP SOLN
2.0000 [drp] | Freq: Once | OPHTHALMIC | Status: AC
  Administered 2015-11-15: 2 [drp] via OPHTHALMIC
  Filled 2015-11-15: qty 2

## 2015-11-15 MED ORDER — SILVER SULFADIAZINE 1 % EX CREA: 1.0000 "application " | TOPICAL_CREAM | Freq: Every day | CUTANEOUS | Status: DC
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
## 2015-11-15 NOTE — Discharge Instructions (Signed)
Ms. Denise [name],  Nice meeting you! Please follow-up with your primary care provider and ophthalmologist as needed. Return to the emergency department if you develop fevers, nausea/vomiting, inability to move your arm/wrist/face, increasing pain. Feel better soon!  S. Wendie Simmer, PA-C Burn Care Your skin is a natural barrier to infection. It is the largest organ of your body. Burns damage this natural protection. To help prevent infection, it is very important to follow your caregiver's instructions in the care of your burn. Burns are classified as:  First degree. There is only redness of the skin (erythema). No scarring is expected.  Second degree. There is blistering of the skin. Scarring may occur with deeper burns.  Third degree. All layers of the skin are injured, and scarring is expected. HOME CARE INSTRUCTIONS   Wash your hands well before changing your bandage.  Change your bandage as often as directed by your caregiver.  Remove the old bandage. If the bandage sticks, you may soak it off with cool, clean water.  Cleanse the burn thoroughly but gently with mild soap and water.  Pat the area dry with a clean, dry cloth.  Apply a thin layer of antibacterial cream to the burn.  Apply a clean bandage as instructed by your caregiver.  Keep the bandage as clean and dry as possible.  Elevate the affected area for the first 24 hours, then as instructed by your caregiver.  Only take over-the-counter or prescription medicines for pain, discomfort, or fever as directed by your caregiver. SEEK IMMEDIATE MEDICAL CARE IF:   You develop excessive pain.  You develop redness, tenderness, swelling, or red streaks near the burn.  The burned area develops yellowish-white fluid (pus) or a bad smell.  You have a fever. MAKE SURE YOU:   Understand these instructions.  Will watch your condition.  Will get help right away if you are not doing well or get worse.   This information  is not intended to replace advice given to you by your health care provider. Make sure you discuss any questions you have with your health care provider.   Document Released: 05/14/2005 Document Revised: 08/06/2011 Document Reviewed: 10/04/2010 Elsevier Interactive Patient Education [employer].

## 2015-12-01 IMAGING — CT CT ABD-PELV W/ CM
2 of 4 series · 16 of 46 positions shown, 18 images · IV contrast (Omni 300)
Comparison: None [DATE]

CLINICAL DATA: Epigastric pain, multiple episodes over months but
worse tonight.

EXAM:
CT ABDOMEN AND PELVIS WITH CONTRAST
TECHNIQUE: Multidetector CT imaging of the abdomen and pelvis was performed
using the standard protocol following bolus administration of
intravenous contrast.
CONTRAST:  100mL OMNIPAQUE IOHEXOL 300 MG/ML  SOLN

[Series 2: abd/ pelvis 5.0 i30f 1 · axial · 0.71mm/px · z∈[-508,-104]mm · 13 of 89 slices shown, 15 images]
[im 4/89  soft-tissue]
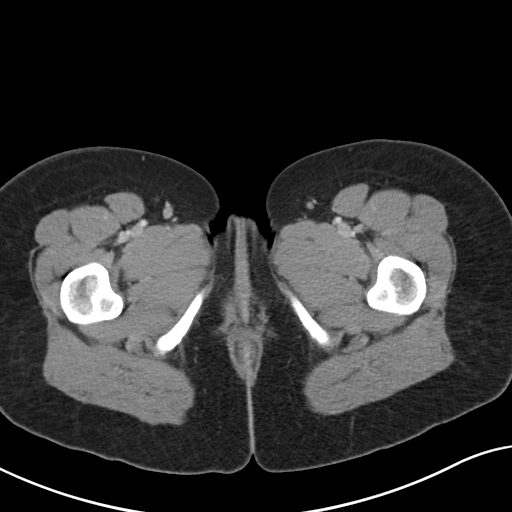
[im 4/89  bone]
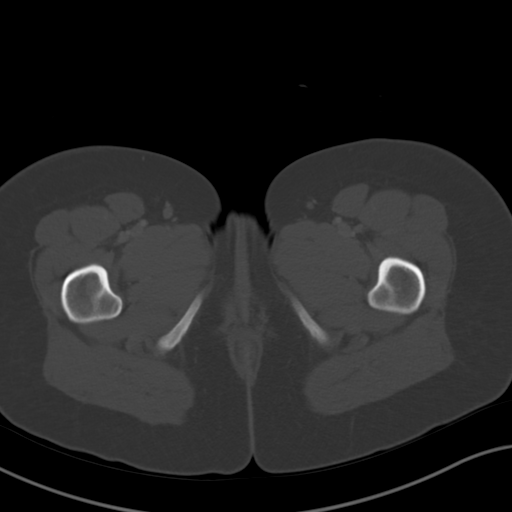
[im 12/89  soft-tissue]
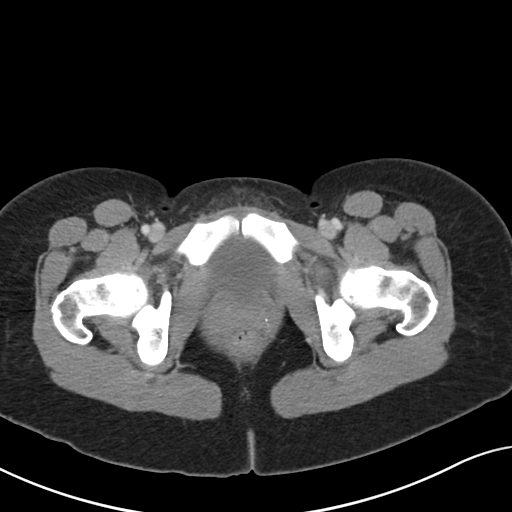
[im 19/89  soft-tissue]
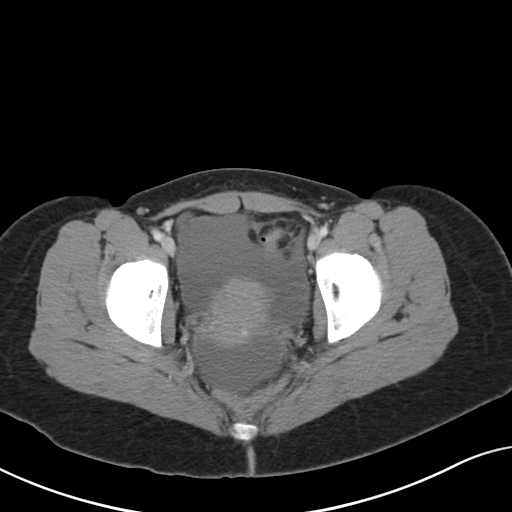
[im 26/89  soft-tissue]
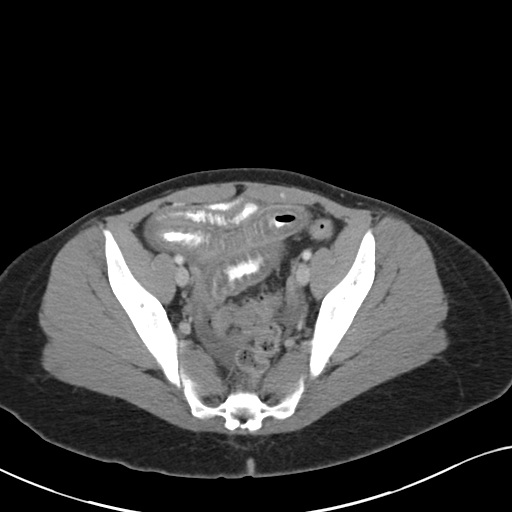
[im 30/89  soft-tissue]
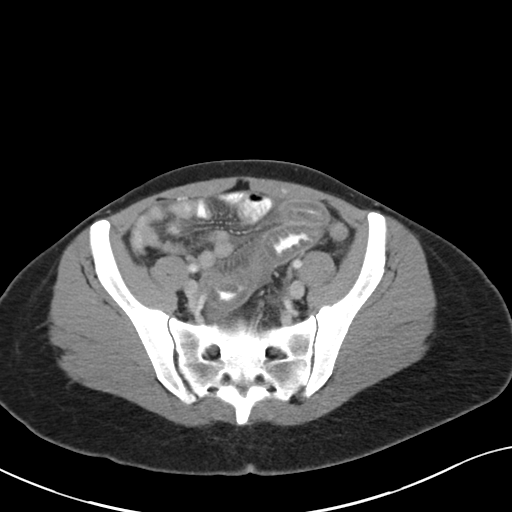
[im 37/89  soft-tissue]
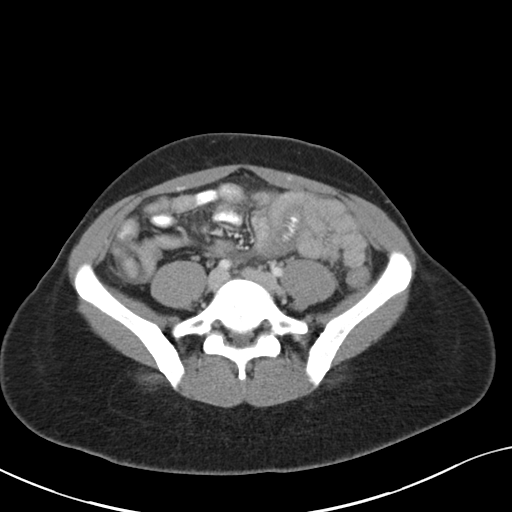
[im 45/89  soft-tissue]
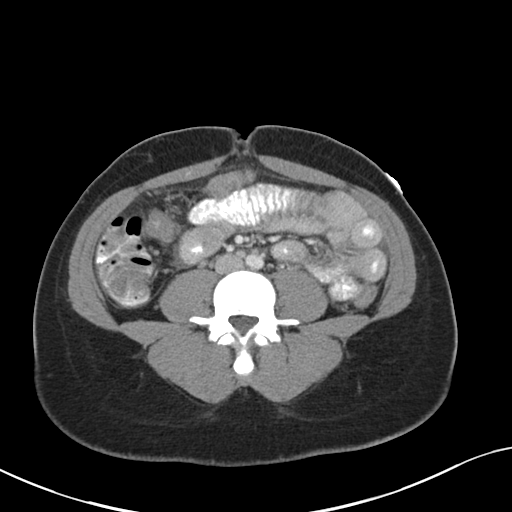
[im 52/89  soft-tissue]
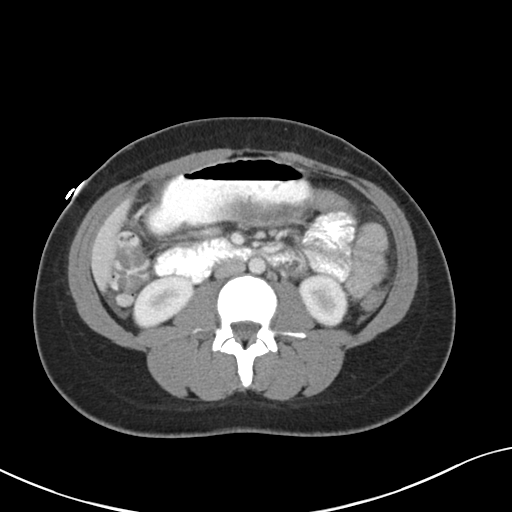
[im 59/89  soft-tissue]
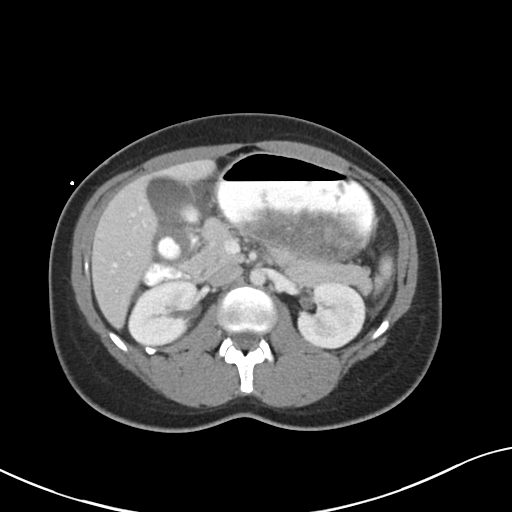
[im 59/89  bone]
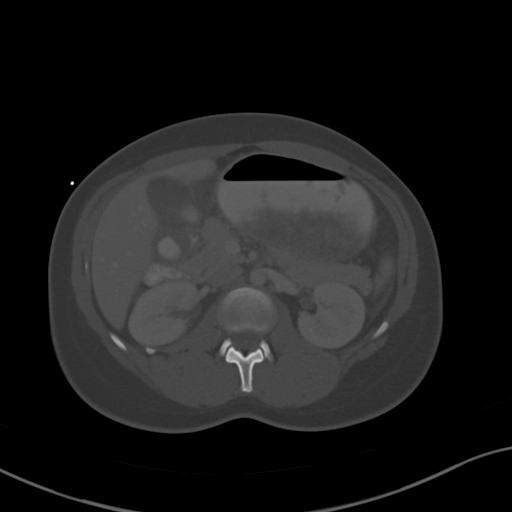
[im 63/89  soft-tissue]
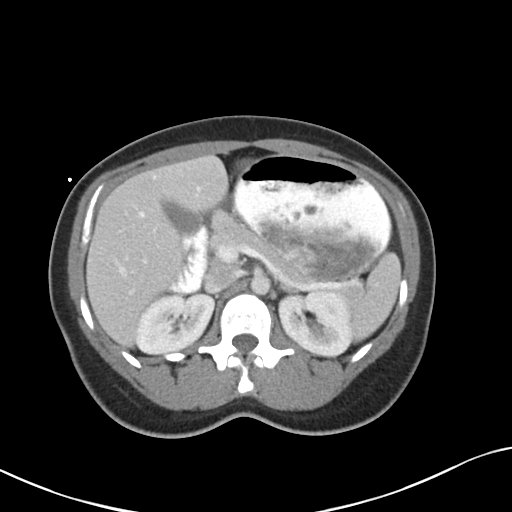
[im 70/89  soft-tissue]
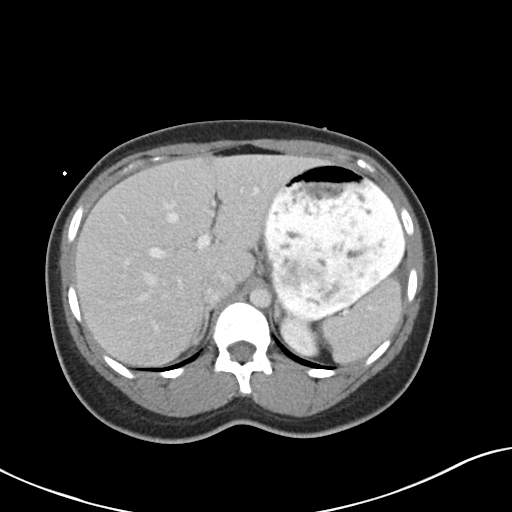
[im 78/89  soft-tissue]
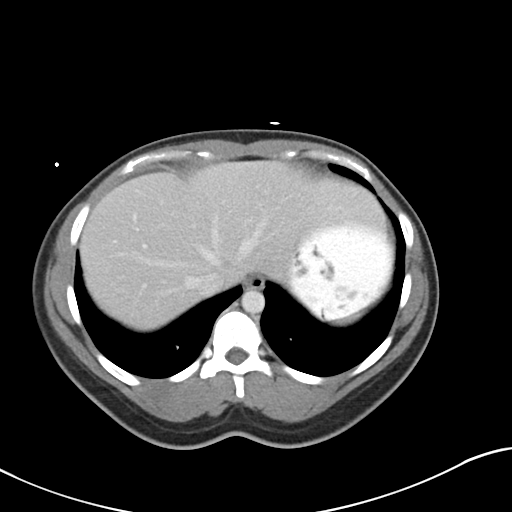
[im 85/89  soft-tissue]
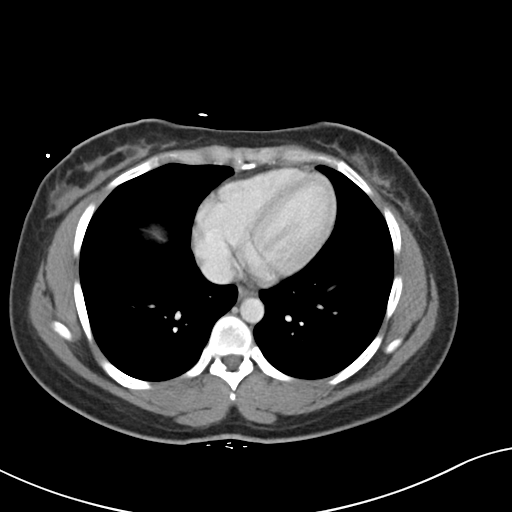

[Series 5: coronals · coronal · 0.65mm/px · 3 of 127 slices shown]
[im 43/127  soft-tissue]
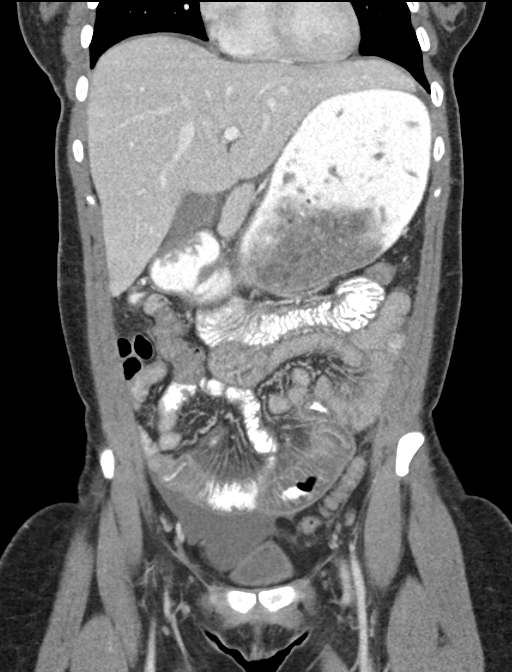
[im 57/127  soft-tissue]
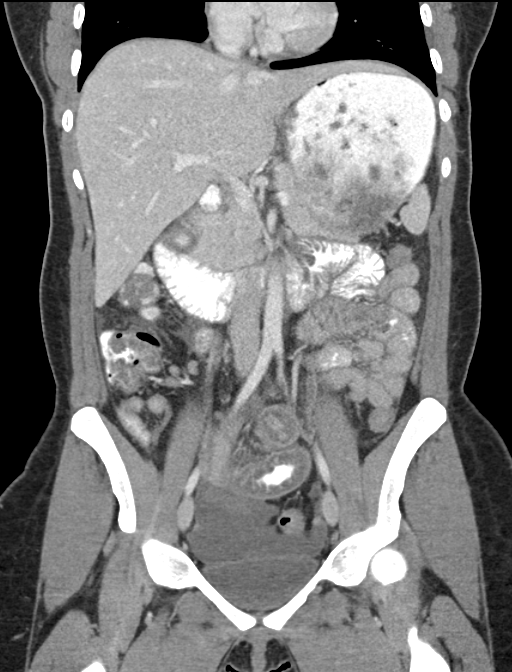
[im 71/127  soft-tissue]
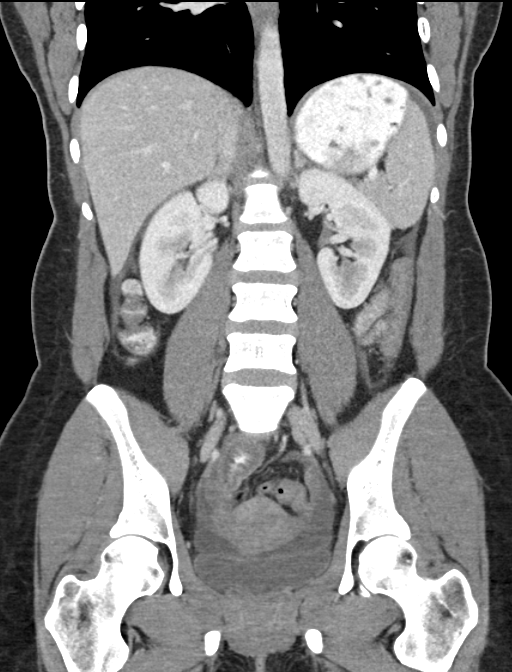

[16 of 46 positions shown; findings below may reference images not displayed]

FINDINGS: BODY WALL: No contributory findings.

LOWER CHEST: No contributory findings.

ABDOMEN/PELVIS:

Liver: No focal abnormality.

Biliary: No evidence of biliary obstruction or stone.

Pancreas: Unremarkable.

Spleen: Unremarkable.

Adrenals: Unremarkable.

Kidneys and ureters: No hydronephrosis or stone.

Bladder: Unremarkable.

Reproductive: No pathologic findings.

Bowel: There is severe a thickening of distal small bowel loops from
submucosal edema. Some skip areas of inflammation or also seen, with
wall thickening near the ligament of Treitz and in left abdominal
jejunal segments. The peri pyloric region also appears thickened.
Possible mild mid transverse colon involvement. No terminal ileum
involvement. No bowel obstruction. No appendicitis.

Retroperitoneum: Reactive appearing enlargement of mesenteric lymph
nodes.

Peritoneum: Reactive ascites mainly in the pelvis.

Vascular: No acute abnormality.

OSSEOUS: No sacroiliitis.
IMPRESSION: Prominent enteritis. Although usually infectious, Crohn's (there are
skip areas of inflammation) and autoimmune enteritis should also be
considered.

## 2016-02-01 ENCOUNTER — Encounter: Payer: Self-pay | Admitting: Nurse Practitioner

## 2016-02-01 ENCOUNTER — Ambulatory Visit (INDEPENDENT_AMBULATORY_CARE_PROVIDER_SITE_OTHER): Payer: 59 | Admitting: Nurse Practitioner

## 2016-02-01 VITALS — BP 100/70 | HR 76 | Ht 63.25 in | Wt 158.0 lb

## 2016-02-01 DIAGNOSIS — Z01419 Encounter for gynecological examination (general) (routine) without abnormal findings: Secondary | ICD-10-CM

## 2016-02-01 DIAGNOSIS — Z0000 Encounter for general adult medical examination without abnormal findings: Secondary | ICD-10-CM | POA: Diagnosis not present

## 2016-02-01 DIAGNOSIS — N939 Abnormal uterine and vaginal bleeding, unspecified: Secondary | ICD-10-CM

## 2016-02-01 DIAGNOSIS — N760 Acute vaginitis: Secondary | ICD-10-CM | POA: Diagnosis not present

## 2016-02-01 DIAGNOSIS — N926 Irregular menstruation, unspecified: Secondary | ICD-10-CM

## 2016-02-01 LAB — POCT URINALYSIS DIPSTICK
BILIRUBIN UA: NEGATIVE
GLUCOSE UA: NEGATIVE
KETONES UA: NEGATIVE
Leukocytes, UA: NEGATIVE
Nitrite, UA: NEGATIVE
PH UA: 7
Protein, UA: NEGATIVE
RBC UA: NEGATIVE
Urobilinogen, UA: NEGATIVE

## 2016-02-01 LAB — HEMOGLOBIN, FINGERSTICK: HEMOGLOBIN, FINGERSTICK: 14.6 g/dL (ref 12.0–16.0)

## 2016-02-01 LAB — POCT URINE PREGNANCY: Preg Test, Ur: NEGATIVE

## 2016-02-01 NOTE — Patient Instructions (Signed)

## 2016-02-01 NOTE — Progress Notes (Deleted)
Patient ID: Denise [name], female   DOB: 1976/04/18, 40 y.o.   MRN: [id number]  40 y.o. G1P1001 Single  African American Fe here for annual exam.  Now on Micardis and HCT since 01/21/16.  Menses now at 5 days, light to moderate.  Then after menses has another cycle 2 weeks later for same amount of time and flow.  Currently off POP for 2 weeks and no further bleeding.  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  Some increase stress with school, work full time, and 2.5 yr son.  Partner is very supportive and tries to help out.  She is not ready to consider another pregnancy soon.   Patient's last menstrual period was 01/09/2016 (exact date).          Sexually active: Yes. The current method of family planning is POP - off X 2 weeks and condoms latex free prn. Same partner since 2005.  Exercising: No.  The patient does not participate in regular exercise at present. Smoker:  no  Health Maintenance: Pap: 01/26/15, Negative with neg HR HPV no history of abnormal pap. TDaP: 07/15/13 HIV: 02/09/13 Labs: HB: 14.6  Urine: neg  UPT: neg   reports that she has never smoked. She has never used smokeless tobacco. She reports that she drinks alcohol. She reports that she does not use drugs.  Past Medical History:  Diagnosis Date  . GERD (gastroesophageal reflux disease)   . HSV-2 infection    2008  . IBS (irritable bowel syndrome)   . IC (interstitial cystitis)    2008  . Migraine   . Pregnancy induced hypertension     Past Surgical History:  Procedure Laterality Date  . BREAST SURGERY  2002   right breast biopsy Benign  . CESAREAN SECTION N/A 08/30/2013   Procedure: CESAREAN SECTION;  Surgeon: Marvene Staff, MD;  Location: [facility];  Service: Obstetrics;  Laterality: N/A;  . KNEE ARTHROSCOPY  3/11, 2012   right knee  . SHOULDER ARTHROSCOPY    . TONSILLECTOMY    . WISDOM TOOTH EXTRACTION      Current Outpatient Prescriptions  Medication Sig  Dispense Refill  . acetaminophen (TYLENOL) 325 MG tablet Take 650 mg by mouth every 6 (six) hours as needed for headache.    . [facility] ALLERGY RELIEF D 60-120 MG 12 hr tablet Take 1 tablet by mouth 2 (two) times daily.  3  . erythromycin ophthalmic ointment Place a 1/2 inch ribbon of ointment into the lower eyelid 4 times daily for 4 days. 1 g 0  . fluticasone (FLONASE) 50 MCG/ACT nasal spray Place 2 sprays into both nostrils 2 (two) times daily as needed for allergies.     Marland Kitchen ibuprofen (ADVIL,MOTRIN) 600 MG tablet Take 1 tablet (600 mg total) by mouth every 6 (six) hours as needed. 30 tablet 0  . omeprazole (PRILOSEC) 20 MG capsule Take 1 capsule (20 mg total) by mouth daily. 20 capsule 0  . Prenatal Vit-Fe Fumarate-FA (PRENATAL MULTIVITAMIN) TABS tablet Take 1 tablet by mouth daily at 12 noon.    . ranitidine (ZANTAC) 150 MG tablet Take 1 tablet (150 mg total) by mouth 2 (two) times daily. 60 tablet 2  . silver sulfADIAZINE (SILVADENE) 1 % cream Apply 1 application topically daily. 50 g 0  . telmisartan-hydrochlorothiazide (MICARDIS HCT) 40-12.5 MG tablet Take 1 tablet by mouth every morning.  1  . valACYclovir (VALTREX) 500 MG tablet Take 1 tablet (  500 mg total) by mouth 2 (two) times daily. 180 tablet 3  . VYVANSE 30 MG capsule Take 30 mg by mouth every morning.  0   No current facility-administered medications for this visit.     Family History  Problem Relation Age of Onset  . Diabetes Mother   . Hyperlipidemia Mother   . Anemia Mother   . Diabetes Maternal Grandmother   . Heart failure Maternal Grandmother   . Diabetes Paternal Grandmother   . Stroke Paternal Grandmother   . Renal Disease Paternal Grandfather   . Heart disease Paternal Grandfather   . Stroke Paternal Grandfather   . Hyperlipidemia Father   . Hypertension Father   . Heart disease Father   . Heart disease Brother   . Anemia Maternal Aunt   . Prostate cancer Maternal Grandfather   . Hypertension Sister   .  Hypertension Sister   . Thyroid disease Sister   . Prostate cancer Paternal Uncle     ROS:  Pertinent items are noted in HPI.  Otherwise, a comprehensive ROS was negative.  Exam:   BP 100/70 (BP Location: Right Arm, Patient Position: Sitting, Cuff Size: Large)   Pulse 76   Ht 5' 3.25" (1.607 m)   Wt 158 lb (71.7 kg)   LMP 01/09/2016 (Exact Date)   BMI 27.77 kg/m  Height: 5' 3.25" (160.7 cm) Ht Readings from Last 3 Encounters:  02/01/16 5' 3.25" (1.607 m)  05/03/15 5' 3.5" (1.613 m)  01/26/15 5' 3.5" (1.613 m)    General appearance: alert, cooperative and appears stated age Head: Normocephalic, without obvious abnormality, atraumatic Neck: no adenopathy, supple, symmetrical, trachea midline and thyroid normal to inspection and palpation Lungs: clear to auscultation bilaterally Breasts: normal appearance, no masses or tenderness Heart: regular rate and rhythm Abdomen: soft, non-tender; no masses,  no organomegaly Extremities: extremities normal, atraumatic, no cyanosis or edema Skin: Skin color, texture, turgor normal. No rashes or lesions Lymph nodes: Cervical, supraclavicular, and axillary nodes normal. No abnormal inguinal nodes palpated Neurologic: Grossly normal   Pelvic: External genitalia:  several lesions at top of clitoris, left labia with tenderness tot he inside fold.  No other rash              Urethra:  normal appearing urethra with no masses, tenderness or lesions              Bartholin's and Skene's: normal                 Vagina: normal appearing vagina with normal color and clear discharge, no lesions              Cervix: anteverted              Pap taken: No. Bimanual Exam:  Uterus:  normal size, contour, position, consistency, mobility, non-tender              Adnexa: no mass, fullness, tenderness               Rectovaginal: Confirms               Anus:  normal sphincter tone, no lesions  Chaperone present: yes  A:  Well Woman with normal  exam  History of HTN - onset with pregnancy = new med's since 01/21/16             POP for contraception  - off for 2 weeks.      Latex free condoms prn   AUB  on POP - history of uterine fibroids  Current outbreak of HSV - still with complaints of 'rash' inside labia that is ongoing since 2014.  Repeat Affirm today.                 P:   Reviewed health and wellness pertinent to exam  Pap smear not done today  Will schedule her for another PUS/SHGM  Will follow with Affirm  Counseled on breast self exam, adequate intake of calcium and vitamin D, diet and exercise return annually or prn  An After Visit Summary was printed and given to the patient.

## 2016-02-02 LAB — WET PREP BY MOLECULAR PROBE
CANDIDA SPECIES: NEGATIVE
Gardnerella vaginalis: NEGATIVE
TRICHOMONAS VAG: NEGATIVE

## 2016-02-04 NOTE — Progress Notes (Signed)
Encounter reviewed by Dr. Makella Buckingham Amundson C. Silva.  

## 2016-02-05 NOTE — Progress Notes (Signed)
Patient ID: Denise [name], female   DOB: 24-Dec-1975, 40 y.o.   MRN: [id number]  40 y.o. G1P1001 Single  African American Fe here for annual exam.  Now on Micardis and HCT since 01/21/16.  Menses now at 5 days, light to moderate.  Then after menses has another cycle 2 weeks later for same amount of time and flow.  Currently off POP for 2 weeks and no further bleeding.  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  Some increase stress with school, work full time, and 2.5 yr son.  Partner is very supportive and tries to help out.  She is not ready to consider another pregnancy soon.   Patient's last menstrual period was 01/09/2016 (exact date).          Sexually active: Yes. The current method of family planning is POP - off X 2 weeks and condoms latex free prn. Same partner since 2005.  Exercising: No.  The patient does not participate in regular exercise at present. Smoker:  no  Health Maintenance: Pap: 01/26/15, Negative with neg HR HPV no history of abnormal pap. TDaP: 07/15/13 HIV: 02/09/13 Labs: HB: 14.6  Urine: neg  UPT: neg   reports that she has never smoked. She has never used smokeless tobacco. She reports that she drinks alcohol. She reports that she does not use drugs.  Past Medical History:  Diagnosis Date  . GERD (gastroesophageal reflux disease)   . HSV-2 infection    2008  . IBS (irritable bowel syndrome)   . IC (interstitial cystitis)    2008  . Migraine   . Pregnancy induced hypertension     Past Surgical History:  Procedure Laterality Date  . BREAST SURGERY  2002   right breast biopsy Benign  . CESAREAN SECTION N/A 08/30/2013   Procedure: CESAREAN SECTION;  Surgeon: Marvene Staff, MD;  Location: [facility];  Service: Obstetrics;  Laterality: N/A;  . KNEE ARTHROSCOPY  3/11, 2012   right knee  . SHOULDER ARTHROSCOPY    . TONSILLECTOMY    . WISDOM TOOTH EXTRACTION      Current Outpatient Prescriptions  Medication Sig  Dispense Refill  . acetaminophen (TYLENOL) 325 MG tablet Take 650 mg by mouth every 6 (six) hours as needed for headache.    . CVS ALLERGY RELIEF D 60-120 MG 12 hr tablet Take 1 tablet by mouth 2 (two) times daily.  3  . erythromycin ophthalmic ointment Place a 1/2 inch ribbon of ointment into the lower eyelid 4 times daily for 4 days. 1 g 0  . fluticasone (FLONASE) 50 MCG/ACT nasal spray Place 2 sprays into both nostrils 2 (two) times daily as needed for allergies.     Marland Kitchen ibuprofen (ADVIL,MOTRIN) 600 MG tablet Take 1 tablet (600 mg total) by mouth every 6 (six) hours as needed. 30 tablet 0  . omeprazole (PRILOSEC) 20 MG capsule Take 1 capsule (20 mg total) by mouth daily. 20 capsule 0  . Prenatal Vit-Fe Fumarate-FA (PRENATAL MULTIVITAMIN) TABS tablet Take 1 tablet by mouth daily at 12 noon.    . ranitidine (ZANTAC) 150 MG tablet Take 1 tablet (150 mg total) by mouth 2 (two) times daily. 60 tablet 2  . silver sulfADIAZINE (SILVADENE) 1 % cream Apply 1 application topically daily. 50 g 0  . telmisartan-hydrochlorothiazide (MICARDIS HCT) 40-12.5 MG tablet Take 1 tablet by mouth every morning.  1  . valACYclovir (VALTREX) 500 MG tablet Take 1 tablet (  500 mg total) by mouth 2 (two) times daily. 180 tablet 3  . VYVANSE 30 MG capsule Take 30 mg by mouth every morning.  0   No current facility-administered medications for this visit.     Family History  Problem Relation Age of Onset  . Diabetes Mother   . Hyperlipidemia Mother   . Anemia Mother   . Diabetes Maternal Grandmother   . Heart failure Maternal Grandmother   . Diabetes Paternal Grandmother   . Stroke Paternal Grandmother   . Renal Disease Paternal Grandfather   . Heart disease Paternal Grandfather   . Stroke Paternal Grandfather   . Hyperlipidemia Father   . Hypertension Father   . Heart disease Father   . Heart disease Brother   . Anemia Maternal Aunt   . Prostate cancer Maternal Grandfather   . Hypertension Sister   .  Hypertension Sister   . Thyroid disease Sister   . Prostate cancer Paternal Uncle     ROS:  Pertinent items are noted in HPI.  Otherwise, a comprehensive ROS was negative.  Exam:   BP 100/70 (BP Location: Right Arm, Patient Position: Sitting, Cuff Size: Large)   Pulse 76   Ht 5' 3.25" (1.607 m)   Wt 158 lb (71.7 kg)   LMP 01/09/2016 (Exact Date)   BMI 27.77 kg/m  Height: 5' 3.25" (160.7 cm) Ht Readings from Last 3 Encounters:  02/01/16 5' 3.25" (1.607 m)  05/03/15 5' 3.5" (1.613 m)  01/26/15 5' 3.5" (1.613 m)    General appearance: alert, cooperative and appears stated age Head: Normocephalic, without obvious abnormality, atraumatic Neck: no adenopathy, supple, symmetrical, trachea midline and thyroid normal to inspection and palpation Lungs: clear to auscultation bilaterally Breasts: normal appearance, no masses or tenderness Heart: regular rate and rhythm Abdomen: soft, non-tender; no masses,  no organomegaly Extremities: extremities normal, atraumatic, no cyanosis or edema Skin: Skin color, texture, turgor normal. No rashes or lesions Lymph nodes: Cervical, supraclavicular, and axillary nodes normal. No abnormal inguinal nodes palpated Neurologic: Grossly normal   Pelvic: External genitalia:  several lesions at top of clitoris, left labia with tenderness tot he inside fold.  No other rash              Urethra:  normal appearing urethra with no masses, tenderness or lesions              Bartholin's and Skene's: normal                 Vagina: normal appearing vagina with normal color and clear discharge, no lesions              Cervix: anteverted              Pap taken: No. Bimanual Exam:  Uterus:  normal size, contour, position, consistency, mobility, non-tender              Adnexa: no mass, fullness, tenderness               Rectovaginal: Confirms               Anus:  normal sphincter tone, no lesions  Chaperone present: yes  A:  Well Woman with normal  exam  History of HTN - onset with pregnancy = new med's since 01/21/16             POP for contraception  - off for 2 weeks.      Latex free condoms prn   AUB  on POP - history of uterine fibroids  Current outbreak of HSV - still with complaints of 'rash' inside labia that is ongoing since 2014.  Repeat Affirm today.                 P:   Reviewed health and wellness pertinent to exam  Pap smear not done today  Will schedule her for another PUS/SHGM  Will follow with Affirm  Counseled on breast self exam, adequate intake of calcium and vitamin D, diet and exercise return annually or prn  An After Visit Summary was printed and given to the patient.

## 2016-02-06 ENCOUNTER — Telehealth: Payer: Self-pay | Admitting: Nurse Practitioner

## 2016-02-06 NOTE — Telephone Encounter (Signed)
Patient returned call. Patient is scheduled for requested sonohysterogram on 02/23/16 with Dr Sabra Heck. Patient is aware of the date, time and cancellation policy.

## 2016-02-06 NOTE — Telephone Encounter (Signed)
Spoke with patient regarding benefits for recommended sonohysterogram. Patient is agreeale, but states will need to check work scheduled before scheduling.  Patient to call back to schedule

## 2016-02-23 ENCOUNTER — Ambulatory Visit (INDEPENDENT_AMBULATORY_CARE_PROVIDER_SITE_OTHER): Payer: 59 | Admitting: Obstetrics & Gynecology

## 2016-02-23 ENCOUNTER — Ambulatory Visit (INDEPENDENT_AMBULATORY_CARE_PROVIDER_SITE_OTHER): Payer: 59

## 2016-02-23 ENCOUNTER — Other Ambulatory Visit: Payer: Self-pay

## 2016-02-23 ENCOUNTER — Encounter: Payer: Self-pay | Admitting: Obstetrics & Gynecology

## 2016-02-23 ENCOUNTER — Other Ambulatory Visit: Payer: Self-pay | Admitting: Obstetrics & Gynecology

## 2016-02-23 VITALS — BP 122/74 | HR 88 | Resp 16 | Ht 63.25 in | Wt 156.2 lb

## 2016-02-23 DIAGNOSIS — N926 Irregular menstruation, unspecified: Secondary | ICD-10-CM

## 2016-02-23 DIAGNOSIS — N939 Abnormal uterine and vaginal bleeding, unspecified: Secondary | ICD-10-CM | POA: Diagnosis not present

## 2016-02-23 DIAGNOSIS — D252 Subserosal leiomyoma of uterus: Secondary | ICD-10-CM | POA: Diagnosis not present

## 2016-02-23 DIAGNOSIS — R350 Frequency of micturition: Secondary | ICD-10-CM | POA: Diagnosis not present

## 2016-02-23 DIAGNOSIS — I10 Essential (primary) hypertension: Secondary | ICD-10-CM

## 2016-02-23 DIAGNOSIS — N912 Amenorrhea, unspecified: Secondary | ICD-10-CM

## 2016-02-23 LAB — POCT URINALYSIS DIPSTICK
BILIRUBIN UA: NEGATIVE
Glucose, UA: NEGATIVE
KETONES UA: NEGATIVE
Leukocytes, UA: NEGATIVE
Nitrite, UA: NEGATIVE
PH UA: 7
PROTEIN UA: NEGATIVE
RBC UA: NEGATIVE
Urobilinogen, UA: NEGATIVE

## 2016-02-23 LAB — TSH: TSH: 1.39 mIU/L

## 2016-02-23 MED ORDER — SULFAMETHOXAZOLE-TRIMETHOPRIM 800-160 MG PO TABS: 1.0000 | ORAL_TABLET | Freq: Two times a day (BID) | ORAL | 0 refills | Status: DC

## 2016-02-23 NOTE — Progress Notes (Signed)
40 y.o. G1P1001 Single African American female here for pelvic ultrasound due to irregular bleeding that started after being on Micronor.  She has been on micronor for about a year.  She did well until May of this year.  Since that time, she's had two episodes of bleeding each month.  This has become so frustrating that she stopped the pill in the end of August.  She has not had a cycle in September.  Pt denies having any increase in pain.  Pt has know hx of fibroid so PUS recommended.  Pt also reports increased urgency and frequency over the past several days.  Having some low back pain.  Would like urine checked.  Patient's last menstrual period was 01/09/2016 (exact date).  Contraception: none, currently  Findings: UTERUS: 6.8 x 4.0 x 3.6 with single subserosal 1.0cm fibroid (decreased in size from prior PUS) EMS: 5.108mm ADNEXA: Left ovary: 2.9 x 1.9 x 1.9cm       Right ovary: 2.9 x 1.8 x 1.8cm CUL DE SAC: no free fluid  Discussion:  Findings reviewed.  Pt does not really want to use any longer acting contraception at this time. Cannot be on estrogen containing OCPs due to risk of stroke with current hypertension issues.  Has allergy to latex condoms.  D/w pt difficulty in natural family planning if cycles aren't regular.  She is considering another pregnancy so would be ok if this occurred.    Called pharmacy while pt was in office to make sure there had not been a change in her generic OCPs.  The pharmacist confirms there has not been.  Assessment:  Amenorrhea this past month after four months of DUB on micronor. Increased urinary frequency Uterine fibroid  Plan:  TSH, Prolactin, HCG qual, FSH obtained today.  If these are all ok, pt will just wait for a few months to see if her cycles are more regulated. PNV encouraged as she is likely going to be without contraception Urine culture pending.  Bactrim DS bid x 3 days.  Pt will be called with culture results.  ~25 minutes spent with  patient >50% of time was in face to face discussion of above.

## 2016-02-23 NOTE — Addendum Note (Signed)
Addended by: Jasmine Awe on: 02/23/2016 09:32 AM   Modules accepted: Orders

## 2016-02-24 DIAGNOSIS — I10 Essential (primary) hypertension: Secondary | ICD-10-CM | POA: Insufficient documentation

## 2016-02-24 LAB — FOLLICLE STIMULATING HORMONE: FSH: 7.8 m[IU]/mL

## 2016-02-24 LAB — HCG, SERUM, QUALITATIVE: Preg, Serum: NEGATIVE

## 2016-02-24 LAB — PROLACTIN: Prolactin: 6 ng/mL

## 2016-02-25 LAB — URINE CULTURE

## 2016-02-28 ENCOUNTER — Telehealth: Payer: Self-pay | Admitting: Obstetrics & Gynecology

## 2016-02-28 NOTE — Telephone Encounter (Signed)
Patient was told to call with update after [facility] 02/23/16. Patient did start her cycle yesterday 02/27/16.

## 2016-02-28 NOTE — Telephone Encounter (Signed)
Spoke with patient. Patient states per conversation with Dr. Sabra Heck at [facility] 02/23/16, she was to call if cycle started. Patient states cycle started 02/27/16. Advised patient I would let Dr. Sabra Heck know, should she have any additional recommendations, I would return call. Patient verbalizes understanding and is agreeable.   Routing to provider for final review. Patient is agreeable to disposition. Will close encounter.

## 2016-03-18 ENCOUNTER — Ambulatory Visit (HOSPITAL_COMMUNITY)
Admission: EM | Admit: 2016-03-18 | Discharge: 2016-03-18 | Disposition: A | Discharge status: Home / Self Care | Payer: Medicaid Other | Attending: Family Medicine | Admitting: Family Medicine

## 2016-03-18 ENCOUNTER — Encounter (HOSPITAL_COMMUNITY): Payer: Self-pay | Admitting: *Deleted

## 2016-03-18 DIAGNOSIS — H109 Unspecified conjunctivitis: Secondary | ICD-10-CM | POA: Diagnosis not present

## 2016-03-18 MED ORDER — TOBRAMYCIN 0.3 % OP SOLN: 1.0000 [drp] | Freq: Four times a day (QID) | OPHTHALMIC | 0 refills | Status: DC

## 2016-03-18 NOTE — ED Triage Notes (Signed)
Pt  Has   An  Irritated  r   Eye          Since  Last  Pm      denys  Any  specefic  Injury  Ambulated  To  Room  With a  Steady  Fluid  Gait         She  Has  Removed  Her  contac     Lens

## 2016-03-18 NOTE — Discharge Instructions (Signed)
No contacts for at least 1 wk after eye infection is resolved, use warm cloth soak to  eye before eye drop medicine.

## 2016-03-18 NOTE — ED Provider Notes (Signed)
[facility]    CSN: [id number] Arrival date & time: 03/18/16  1210     History   Chief Complaint No chief complaint on file.   HPI Denise [name] is a 40 y.o. female.   The history is provided by the patient.  Eye Problem  Quality:  Burning Severity:  Mild Onset quality:  Sudden Duration:  1 day Progression:  Worsening Chronicity:  New Context: contact lenses   Worsened by:  Nothing Ineffective treatments:  None tried Associated symptoms: crusting, discharge and redness   Associated symptoms: no blurred vision, no decreased vision, no itching and no photophobia   Risk factors comment:  Has over worn her 30d lenses   Past Medical History:  Diagnosis Date  . GERD (gastroesophageal reflux disease)   . HSV-2 infection    2008  . IBS (irritable bowel syndrome)   . IC (interstitial cystitis)    2008  . Migraine   . Pregnancy induced hypertension     Patient Active Problem List   Diagnosis Date Noted  . Essential hypertension, benign 02/24/2016  . PIH (pregnancy induced hypertension) 08/29/2013  . Murmur 06/26/2013  . Palpitations 06/26/2013  . IBS (irritable bowel syndrome)   . Migraine   . GERD (gastroesophageal reflux disease)   . HSV-2 infection   . IC (interstitial cystitis)     Past Surgical History:  Procedure Laterality Date  . BREAST SURGERY  2002   right breast biopsy Benign  . CESAREAN SECTION N/A 08/30/2013   Procedure: CESAREAN SECTION;  Surgeon: Marvene Staff, MD;  Location: [facility];  Service: Obstetrics;  Laterality: N/A;  . KNEE ARTHROSCOPY  3/11, 2012   right knee  . SHOULDER ARTHROSCOPY    . TONSILLECTOMY    . WISDOM TOOTH EXTRACTION      OB History    Gravida Para Term Preterm AB Living   1 1 1  0 0 1   SAB TAB Ectopic Multiple Live Births   0 0 0 0 1       Home Medications    Prior to Admission medications   Medication Sig Start Date End Date Taking? Authorizing Provider  acetaminophen (TYLENOL) 325 MG  tablet Take 650 mg by mouth every 6 (six) hours as needed for headache.    Historical Provider, MD  baclofen (LIORESAL) 10 MG tablet TAKE 1/2-1 TAB AS NEEDED TO TWICE DAILY FOR HEADACHE. LIMIT TO 2 HEADACHE DAYS/WK. AVOID DAILY USE 02/16/16   Historical Provider, MD  [facility] ALLERGY RELIEF D 60-120 MG 12 hr tablet Take 1 tablet by mouth 2 (two) times daily. 03/05/15   Historical Provider, MD  fluticasone (FLONASE) 50 MCG/ACT nasal spray Place 2 sprays into both nostrils 2 (two) times daily as needed for allergies.     Historical Provider, MD  omeprazole (PRILOSEC) 20 MG capsule Take 1 capsule (20 mg total) by mouth daily. 08/17/14   Davonna Belling, MD  Prenatal Vit-Fe Fumarate-FA (PRENATAL MULTIVITAMIN) TABS tablet Take 1 tablet by mouth daily at 12 noon.    Historical Provider, MD  ranitidine (ZANTAC) 150 MG tablet Take 1 tablet (150 mg total) by mouth 2 (two) times daily. 03/16/13   Megan Salon, MD  sulfamethoxazole-trimethoprim (BACTRIM DS) 800-160 MG tablet Take 1 tablet by mouth 2 (two) times daily. One PO BID x 3 days 02/23/16   Megan Salon, MD  telmisartan-hydrochlorothiazide (MICARDIS HCT) 40-12.5 MG tablet Take 1 tablet by mouth every morning. 01/21/16   Historical Provider, MD  valACYclovir (  VALTREX) 500 MG tablet Take 1 tablet (500 mg total) by mouth 2 (two) times daily. 01/26/15   Kem Boroughs, FNP  VYVANSE 30 MG capsule Take 30 mg by mouth every morning. 01/21/16   Historical Provider, MD  zonisamide (ZONEGRAN) 25 MG capsule TAKE 4 CAPSULES BY MOUTH AT BEDTIME FOR 30 DAYS. PATIENT DIRECTED HOW TO TITRATE 02/16/16   Historical Provider, MD    Family History Family History  Problem Relation Age of Onset  . Diabetes Mother   . Hyperlipidemia Mother   . Anemia Mother   . Diabetes Maternal Grandmother   . Heart failure Maternal Grandmother   . Diabetes Paternal Grandmother   . Stroke Paternal Grandmother   . Renal Disease Paternal Grandfather   . Heart disease Paternal Grandfather   .  Stroke Paternal Grandfather   . Hyperlipidemia Father   . Hypertension Father   . Heart disease Father   . Heart disease Brother   . Prostate cancer Maternal Grandfather   . Hypertension Sister   . Hypertension Sister   . Thyroid disease Sister   . Anemia Maternal Aunt   . Prostate cancer Paternal Uncle     Social History Social History  Substance Use Topics  . Smoking status: Never Smoker  . Smokeless tobacco: Never Used  . Alcohol use Yes     Comment: socially, not while pregnant     Allergies   Ace inhibitors; Adhesive [tape]; and Latex   Review of Systems Review of Systems  Constitutional: Negative.   HENT: Negative.   Eyes: Positive for discharge and redness. Negative for blurred vision, photophobia, pain and itching.     Physical Exam Triage Vital Signs ED Triage Vitals [03/18/16 1253]  Enc Vitals Group     BP 118/62     Pulse Rate 87     Resp 18     Temp 97.5 F (36.4 C)     Temp Source Oral     SpO2 100 %     Weight      Height      Head Circumference      Peak Flow      Pain Score      Pain Loc      Pain Edu?      Excl. in [place]?    No data found.   Updated Vital Signs BP 118/62 (BP Location: Right Arm)   Pulse 87   Temp 97.5 F (36.4 C) (Oral)   Resp 18   SpO2 100%   Visual Acuity Right Eye Distance:   Left Eye Distance:   Bilateral Distance:    Right Eye Near:   Left Eye Near:    Bilateral Near:     Physical Exam  Constitutional: She appears well-developed and well-nourished. No distress.  HENT:  Head: Normocephalic and atraumatic.  Eyes: EOM are normal. Pupils are equal, round, and reactive to light. Right eye exhibits discharge. Right conjunctiva is injected. Left conjunctiva is not injected.  Neck: Normal range of motion. Neck supple.  Lymphadenopathy:    She has no cervical adenopathy.  Nursing note and vitals reviewed.    UC Treatments / Results  Labs (all labs ordered are listed, but only abnormal results are  displayed) Labs Reviewed - No data to display  EKG  EKG Interpretation None       Radiology No results found.  Procedures Procedures (including critical care time)  Medications Ordered in UC Medications - No data to display   Initial  Impression / Assessment and Plan / UC Course  I have reviewed the triage vital signs and the nursing notes.  Pertinent labs & imaging results that were available during my care of the patient were reviewed by me and considered in my medical decision making (see chart for details).  Clinical Course      Final Clinical Impressions(s) / UC Diagnoses   Final diagnoses:  None    New Prescriptions New Prescriptions   No medications on file     Billy Fischer, MD 03/18/16 1317

## 2016-03-27 ENCOUNTER — Ambulatory Visit: Payer: 59 | Admitting: Obstetrics & Gynecology

## 2016-05-31 ENCOUNTER — Encounter: Payer: Self-pay | Admitting: Obstetrics & Gynecology

## 2016-05-31 ENCOUNTER — Ambulatory Visit (INDEPENDENT_AMBULATORY_CARE_PROVIDER_SITE_OTHER): Payer: 59 | Admitting: Obstetrics & Gynecology

## 2016-05-31 VITALS — BP 124/80 | HR 78 | Resp 14 | Ht 63.25 in | Wt 154.0 lb

## 2016-05-31 DIAGNOSIS — N898 Other specified noninflammatory disorders of vagina: Secondary | ICD-10-CM

## 2016-05-31 DIAGNOSIS — R350 Frequency of micturition: Secondary | ICD-10-CM | POA: Diagnosis not present

## 2016-05-31 LAB — POCT URINALYSIS DIPSTICK
Bilirubin, UA: NEGATIVE
Blood, UA: NEGATIVE
Glucose, UA: NEGATIVE
Ketones, UA: NEGATIVE
LEUKOCYTES UA: NEGATIVE
Nitrite, UA: NEGATIVE
PH UA: 6
Protein, UA: NEGATIVE
Urobilinogen, UA: NEGATIVE

## 2016-05-31 MED ORDER — VALACYCLOVIR HCL 500 MG PO TABS: 500.0000 mg | ORAL_TABLET | Freq: Two times a day (BID) | ORAL | 4 refills | Status: DC

## 2016-05-31 MED ORDER — RANITIDINE HCL 150 MG PO TABS: 150.0000 mg | ORAL_TABLET | Freq: Two times a day (BID) | ORAL | 2 refills | Status: DC

## 2016-05-31 NOTE — Progress Notes (Signed)
GYNECOLOGY  VISIT   HPI: 41 y.o. G3P1001 Single African American female with DUB.  Stopped micronor due to the amount of irregular bleeding she had with the micronor.  She did have a cycle in October, November, and December.  Cycles are about every 23 days.  She is keeping a Therapist, sports and this was reviewed.  Flow is not heavy, lasting 3-4 days.   She is ok if she gets pregnant.  Does not want to be aggressive about getting pregnant.  May feel differently later this year and will let me know.   D/w pt her medications and what she needs to change and/or stop with pregnancy.  She is going to Dr. Criss Rosales in the next week or two and will discuss BP medication then.  Also, reports some urinary urgency over the past two weeks.  Doesn't drink more than one cup of coffee daily.  Patient's last menstrual period was 05/11/2016.     Patient Active Problem List   Diagnosis Date Noted  . Essential hypertension, benign 02/24/2016  . PIH (pregnancy induced hypertension) 08/29/2013  . Murmur 06/26/2013  . Palpitations 06/26/2013  . IBS (irritable bowel syndrome)   . Migraine   . GERD (gastroesophageal reflux disease)   . HSV-2 infection   . IC (interstitial cystitis)     Past Medical History:  Diagnosis Date  . GERD (gastroesophageal reflux disease)   . HSV-2 infection    2008  . IBS (irritable bowel syndrome)   . IC (interstitial cystitis)    2008  . Migraine   . Pregnancy induced hypertension     Past Surgical History:  Procedure Laterality Date  . BREAST SURGERY  2002   right breast biopsy Benign  . CESAREAN SECTION N/A 08/30/2013   Procedure: CESAREAN SECTION;  Surgeon: Marvene Staff, MD;  Location: [facility];  Service: Obstetrics;  Laterality: N/A;  . KNEE ARTHROSCOPY  3/11, 2012   right knee  . SHOULDER ARTHROSCOPY    . TONSILLECTOMY    . WISDOM TOOTH EXTRACTION      MEDS:  Reviewed in EPIC and UTD  ALLERGIES: Ace inhibitors; Adhesive [tape]; and Latex  Family History   Problem Relation Age of Onset  . Diabetes Mother   . Hyperlipidemia Mother   . Anemia Mother   . Diabetes Maternal Grandmother   . Heart failure Maternal Grandmother   . Diabetes Paternal Grandmother   . Stroke Paternal Grandmother   . Renal Disease Paternal Grandfather   . Heart disease Paternal Grandfather   . Stroke Paternal Grandfather   . Hyperlipidemia Father   . Hypertension Father   . Heart disease Father   . Heart disease Brother   . Prostate cancer Maternal Grandfather   . Hypertension Sister   . Hypertension Sister   . Thyroid disease Sister   . Anemia Maternal Aunt   . Prostate cancer Paternal Uncle    SH:  Single, non smoker  Review of Systems  Genitourinary: Positive for urgency.  All other systems reviewed and are negative.   PHYSICAL EXAMINATION:    BP 124/80 (BP Location: Right Arm, Patient Position: Sitting, Cuff Size: Normal)   Pulse 78   Resp 14   Ht 5' 3.25" (1.607 m)   Wt 154 lb (69.9 kg)   LMP 05/11/2016   BMI 27.06 kg/m     General appearance: alert, cooperative and appears stated age Abdomen: soft, non-tender; bowel sounds normal; no masses,  no organomegaly  Pelvic: External genitalia:  no  lesions              Urethra:  normal appearing urethra with no masses, tenderness or lesions              Bartholins and Skenes: normal                 Vagina: normal appearing vagina with scant vaginal discharge and discharge, no lesions              Cervix: no lesions              Bimanual Exam:  Uterus:  normal size, contour, position, consistency, mobility, non-tender              Adnexa: no mass, fullness, tenderness              Rectovaginal: No..               Anus:  normal sphincter tone, no lesions  Chaperone was present for exam.  Assessment: Urinary urgency/frequency DUB, resolved with cessation of micronor Desires for pregnancy  Plan: Urine culture pending Valtrex rx done for the year.  Pt is out. She will see Dr. Criss Rosales for  adjustment of BP medication Affirm pending   ~30 minutes spent with patient >50% of time was in face to face discussion of above.

## 2016-06-01 LAB — WET PREP BY MOLECULAR PROBE
CANDIDA SPECIES: NEGATIVE
GARDNERELLA VAGINALIS: NEGATIVE
Trichomonas vaginosis: NEGATIVE

## 2016-06-01 LAB — URINE CULTURE: Organism ID, Bacteria: NO GROWTH

## 2016-06-07 ENCOUNTER — Ambulatory Visit (INDEPENDENT_AMBULATORY_CARE_PROVIDER_SITE_OTHER): Payer: 59 | Admitting: Obstetrics & Gynecology

## 2016-06-07 ENCOUNTER — Telehealth: Payer: Self-pay | Admitting: Obstetrics & Gynecology

## 2016-06-07 ENCOUNTER — Encounter: Payer: Self-pay | Admitting: Obstetrics & Gynecology

## 2016-06-07 VITALS — BP 116/70 | HR 88 | Resp 16 | Wt 152.0 lb

## 2016-06-07 DIAGNOSIS — M545 Low back pain, unspecified: Secondary | ICD-10-CM

## 2016-06-07 DIAGNOSIS — M25552 Pain in left hip: Secondary | ICD-10-CM | POA: Diagnosis not present

## 2016-06-07 DIAGNOSIS — N3010 Interstitial cystitis (chronic) without hematuria: Secondary | ICD-10-CM | POA: Diagnosis not present

## 2016-06-07 DIAGNOSIS — R309 Painful micturition, unspecified: Secondary | ICD-10-CM

## 2016-06-07 NOTE — Telephone Encounter (Signed)
Return call to patient. Appointment scheduled for 330 this afternoon with Dr Sabra Heck.   Routing to provider for final review. Patient agreeable to disposition. Will close encounter.

## 2016-06-07 NOTE — Telephone Encounter (Signed)
Patient is asking to talk with a nurse regarding her urine results from last week. Patient is asking if she was tested for a UTI?Denise [name] Patient is having lower back pain that is going down her thigh.

## 2016-06-07 NOTE — Telephone Encounter (Signed)
Spoke with patient. Patient asking if she had testing done for UTI at last OV 05/31/16? Advised patient of lab results dated 05/31/16 as seen below per Dr. Sabra Heck. Patient states last week she started to experience left lower back pain that goes around to her pelvis. Patient states when she urinates she does not feel like bladder completley empties. Patient states this week she has started to experience pain in thigh and groin area. Patient states she just does not feel good, reports feeling tired and nauseous at times. Patient denies fever. Advised patient will review with Dr. Sabra Heck for recommendations and return call. Patient is agreeable.   Dr. Sabra Heck, please advise?   Viewed by Denise [name] on 06/04/2016 3:35 PM  Written by Megan Salon, MD on 06/04/2016 1:38 PM  Denise [name],  Your urine culture was negative. Please let me know if you have any questions.   Dr. Sabra Heck  ----    Viewed by Denise [name] on 06/01/2016 12:41 PM  Written by Megan Salon, MD on 06/01/2016 10:21 AM  Denise [name],  Your testing for BV or yeast was negative. If you feel the discharge is bothersome, you can make a safe douche with over the counter hydrogen peroxide and water (mixed 1:1) and use this up to twice weekly. This is safe and should help. Let me know if you have any questions.   Dr. Sabra Heck

## 2016-06-07 NOTE — Progress Notes (Signed)
GYNECOLOGY  VISIT    HPI: 41 y.o. G74P1001 Single African American female with complaint of left lower back pain that radiates to the groin and into the hip.  Pt called thinking she had a UTI, despite having a negative urine culture on 06/01/15.  She states she thinks this is a UTI because she also has the feeling of incomplete emptying and some mild dysuria at times.  Denies vaginal bleeding or discharge.  Pt reports in the past she's had something similar with significant lower back pain that radiates to the left hip and into the groin area.  She reports has seen a GI for this and does have some intermittent constipation but controls this well with stool softeners when needed.  As well, she has seen a urologist in the past and was diagnosed with IC.  Typically, she does not have any urinary symptoms.  She does understand that IC can cause the urinary symptoms she has.  She has never been treated for IC because she "never had symptoms".  7/16, pt had episode of mid-epigastric pain and went to the ER where CT scan was done showing some enteritis.  Was treated with steroids and antibiotics.  Saw Dr. Earlean Shawl after the CT scan.  He did additional blood work to evaluated for inflammatory bowel disease.  States she was told this was not Crohn's or UC.  I do not have these records.  This episode was definitively different than what she is having today.  Hasn't tried anything for her low back pain.  Hasn't taken any tylenol or motrin.  Has been advised to not use this due to migraine headaches.  Reports it hurts the most when she lies flat on her back with her legs extended.  She does also have worsening pain with certain movements of her left leg.  (She showed me some of the movements today and external rotation of the hip is one of the movements that causes pain.)  She does report pain with sitting and sometimes with certain movements even when standing.  In the past when she's had this episodic pain, she  reports it just eventually gets better.  She does not remember this worsening with her pregnancy.   GYNECOLOGIC HISTORY: Patient's last menstrual period was 06/01/2016. Contraception: none  Patient Active Problem List   Diagnosis Date Noted  . Essential hypertension, benign 02/24/2016  . PIH (pregnancy induced hypertension) 08/29/2013  . Murmur 06/26/2013  . Palpitations 06/26/2013  . IBS (irritable bowel syndrome)   . Migraine   . GERD (gastroesophageal reflux disease)   . HSV-2 infection   . IC (interstitial cystitis)     Past Medical History:  Diagnosis Date  . GERD (gastroesophageal reflux disease)   . HSV-2 infection    2008  . IBS (irritable bowel syndrome)   . IC (interstitial cystitis)    2008  . Migraine   . Pregnancy induced hypertension     Past Surgical History:  Procedure Laterality Date  . BREAST SURGERY  2002   right breast biopsy Benign  . CESAREAN SECTION N/A 08/30/2013   Procedure: CESAREAN SECTION;  Surgeon: Marvene Staff, MD;  Location: [facility];  Service: Obstetrics;  Laterality: N/A;  . KNEE ARTHROSCOPY  3/11, 2012   right knee  . SHOULDER ARTHROSCOPY    . TONSILLECTOMY    . WISDOM TOOTH EXTRACTION      MEDS:  Reviewed in EPIC and UTD  ALLERGIES: Ace inhibitors; Adhesive [tape]; and Latex  Family  History  Problem Relation Age of Onset  . Diabetes Mother   . Hyperlipidemia Mother   . Anemia Mother   . Diabetes Maternal Grandmother   . Heart failure Maternal Grandmother   . Diabetes Paternal Grandmother   . Stroke Paternal Grandmother   . Renal Disease Paternal Grandfather   . Heart disease Paternal Grandfather   . Stroke Paternal Grandfather   . Hyperlipidemia Father   . Hypertension Father   . Heart disease Father   . Heart disease Brother   . Prostate cancer Maternal Grandfather   . Hypertension Sister   . Hypertension Sister   . Thyroid disease Sister   . Anemia Maternal Aunt   . Prostate cancer Paternal Uncle     SH:   Single but in long term relationship, non smoker  Review of Systems  Constitutional: Negative.   Respiratory: Negative.   Cardiovascular: Negative.   Gastrointestinal: Positive for constipation. Negative for abdominal pain, diarrhea, nausea and vomiting.  Genitourinary: Positive for dysuria. Negative for frequency, hematuria and urgency.  Musculoskeletal: Positive for back pain.    PHYSICAL EXAMINATION:    BP 116/70 (BP Location: Right Arm, Patient Position: Sitting, Cuff Size: Normal)   Pulse 88   Resp 16   Wt 152 lb (68.9 kg)   LMP 06/01/2016   BMI 26.71 kg/m      Physical Exam  Constitutional: She is oriented to person, place, and time. She appears well-developed and well-nourished.  Cardiovascular: Normal rate and regular rhythm.   Respiratory: Effort normal and breath sounds normal.  GI: Soft. Bowel sounds are normal. She exhibits no distension and no mass. There is no tenderness. There is no rebound and no guarding.  Musculoskeletal:       Lumbar back: She exhibits tenderness (left lower back).       Arms: Pt experiences pain into her hip with external rotation of the hip and with extension of her left leg when lying flat on her back.  Right side does not hurt with any movements.  Neurological: She is alert and oriented to person, place, and time.  Skin: Skin is warm and dry.  Psychiatric: She has a normal mood and affect.    Chaperone was present for exam.  Assessment: Left lower back pain with radiation into her left hip and into her left groin  Plan: D/w pt I just do not think this is gyn related.  I feel this is low back and hip related.  I think she needs to see ortho.  Will try to schedule this as soon as possible.  No rx's given to pt.

## 2016-06-07 NOTE — Progress Notes (Signed)
Scheduled patient while in office to see Dr.Voytek at [facility] on 06/11/2016 at 2 pm. Patient is agreeable to date and time.

## 2016-06-08 LAB — POCT URINALYSIS DIPSTICK
Bilirubin, UA: NEGATIVE
Blood, UA: NEGATIVE
Glucose, UA: NEGATIVE
KETONES UA: NEGATIVE
LEUKOCYTES UA: NEGATIVE
NITRITE UA: NEGATIVE
PROTEIN UA: NEGATIVE
Urobilinogen, UA: NEGATIVE
pH, UA: 7

## 2016-06-11 DIAGNOSIS — M545 Low back pain: Secondary | ICD-10-CM | POA: Diagnosis not present

## 2016-06-11 DIAGNOSIS — M25552 Pain in left hip: Secondary | ICD-10-CM | POA: Diagnosis not present

## 2016-06-15 DIAGNOSIS — M545 Low back pain: Secondary | ICD-10-CM | POA: Diagnosis not present

## 2016-06-20 DIAGNOSIS — G43839 Menstrual migraine, intractable, without status migrainosus: Secondary | ICD-10-CM | POA: Diagnosis not present

## 2016-06-20 DIAGNOSIS — G43111 Migraine with aura, intractable, with status migrainosus: Secondary | ICD-10-CM | POA: Diagnosis not present

## 2016-06-20 DIAGNOSIS — G43719 Chronic migraine without aura, intractable, without status migrainosus: Secondary | ICD-10-CM | POA: Diagnosis not present

## 2016-06-30 DIAGNOSIS — M5137 Other intervertebral disc degeneration, lumbosacral region: Secondary | ICD-10-CM | POA: Diagnosis not present

## 2016-06-30 DIAGNOSIS — J399 Disease of upper respiratory tract, unspecified: Secondary | ICD-10-CM | POA: Diagnosis not present

## 2016-06-30 DIAGNOSIS — I10 Essential (primary) hypertension: Secondary | ICD-10-CM | POA: Diagnosis not present

## 2016-07-04 DIAGNOSIS — M5116 Intervertebral disc disorders with radiculopathy, lumbar region: Secondary | ICD-10-CM | POA: Diagnosis not present

## 2016-07-04 DIAGNOSIS — M545 Low back pain: Secondary | ICD-10-CM | POA: Diagnosis not present

## 2016-07-18 DIAGNOSIS — M5116 Intervertebral disc disorders with radiculopathy, lumbar region: Secondary | ICD-10-CM | POA: Diagnosis not present

## 2016-08-01 DIAGNOSIS — M47817 Spondylosis without myelopathy or radiculopathy, lumbosacral region: Secondary | ICD-10-CM | POA: Diagnosis not present

## 2016-08-01 DIAGNOSIS — M545 Low back pain: Secondary | ICD-10-CM | POA: Diagnosis not present

## 2016-08-15 DIAGNOSIS — M47817 Spondylosis without myelopathy or radiculopathy, lumbosacral region: Secondary | ICD-10-CM | POA: Diagnosis not present

## 2016-08-22 DIAGNOSIS — G44209 Tension-type headache, unspecified, not intractable: Secondary | ICD-10-CM | POA: Diagnosis not present

## 2016-08-22 DIAGNOSIS — J399 Disease of upper respiratory tract, unspecified: Secondary | ICD-10-CM | POA: Diagnosis not present

## 2016-08-22 DIAGNOSIS — J028 Acute pharyngitis due to other specified organisms: Secondary | ICD-10-CM | POA: Diagnosis not present

## 2016-08-29 DIAGNOSIS — M47817 Spondylosis without myelopathy or radiculopathy, lumbosacral region: Secondary | ICD-10-CM | POA: Diagnosis not present

## 2016-09-04 DIAGNOSIS — G43839 Menstrual migraine, intractable, without status migrainosus: Secondary | ICD-10-CM | POA: Diagnosis not present

## 2016-09-04 DIAGNOSIS — G43719 Chronic migraine without aura, intractable, without status migrainosus: Secondary | ICD-10-CM | POA: Diagnosis not present

## 2016-09-04 DIAGNOSIS — G43111 Migraine with aura, intractable, with status migrainosus: Secondary | ICD-10-CM | POA: Diagnosis not present

## 2016-10-02 ENCOUNTER — Telehealth: Payer: Self-pay | Admitting: Obstetrics & Gynecology

## 2016-10-02 ENCOUNTER — Ambulatory Visit (INDEPENDENT_AMBULATORY_CARE_PROVIDER_SITE_OTHER): Payer: 59 | Admitting: Obstetrics & Gynecology

## 2016-10-02 ENCOUNTER — Encounter: Payer: Self-pay | Admitting: Obstetrics & Gynecology

## 2016-10-02 VITALS — BP 124/70 | HR 96 | Temp 97.9°F | Resp 14 | Ht 63.25 in | Wt 154.0 lb

## 2016-10-02 DIAGNOSIS — N926 Irregular menstruation, unspecified: Secondary | ICD-10-CM | POA: Diagnosis not present

## 2016-10-02 DIAGNOSIS — R1032 Left lower quadrant pain: Secondary | ICD-10-CM | POA: Diagnosis not present

## 2016-10-02 LAB — POCT URINE PREGNANCY: Preg Test, Ur: NEGATIVE

## 2016-10-02 MED ORDER — TRAMADOL HCL 50 MG PO TABS: 100.0000 mg | ORAL_TABLET | Freq: Four times a day (QID) | ORAL | 0 refills | Status: DC | PRN

## 2016-10-02 MED ORDER — IBUPROFEN 800 MG PO TABS: 800.0000 mg | ORAL_TABLET | Freq: Three times a day (TID) | ORAL | 0 refills | Status: DC | PRN

## 2016-10-02 NOTE — Telephone Encounter (Signed)
Patient called and said, "I'm having severe cramping on my left side that has been going on for a couple of months around my cycle. This past month it has gotten much worse."  Last seen: 06/07/16

## 2016-10-02 NOTE — Telephone Encounter (Signed)
Spoke with patient. Patient reports left sided pelvic pain that started on 5/4. Patient reports she experienced same pain a few months ago but did not last as long. Pain happens before cycle, LMP 09/18/16. Cycles last 23-25 days, uses app to keep menses calendar. Reports clear, stringy vaginal discharge, no odor or urinary complaints. Recommended OV for further evaluation, patient scheduled for today at 4pm with Dr. Sabra Heck. Patient is agreeable to date and time.   Routing to provider for final review. Patient is agreeable to disposition. Will close encounter.

## 2016-10-02 NOTE — Progress Notes (Signed)
GYNECOLOGY  VISIT   HPI: 41 y.o. G11P1001 Single African American female here for complaint LLQ pain that started on over the weekend.  Reports she's been experiencing some intermittent pelvic pain over the last three months.  In March, it lasted only one day but was on the right side.  In April, it was on the left side but lasted only a day.  This month, is is on the same left side but worsened Sunday night.  It was intense enough yesterday that she did consider going to the emergency room.  However, it has improved some today.  She did have some associated nausea last night.  Denies N/V/D/C or fevers.  Denies urinary symptoms except for some mild increased frequency.    Her cycles have been occurring about every 23-25 days.  Flow lasts about 5 days, with two days with medium flow.  No irregular spotting.  She has two episodes of bleeding in April.  These were only 20 days apart.    Reports her low back pain has much improved.  She saw Dr. Lynann Bologna and was diagnosed with several bulging discs.  She was treated with two epidural injections about four weeks apart and now feels so much better.    GYNECOLOGIC HISTORY: Patient's last menstrual period was 09/18/2016. Contraception: withdraw Menopausal hormone therapy: None  Patient Active Problem List   Diagnosis Date Noted  . Essential hypertension, benign 02/24/2016  . PIH (pregnancy induced hypertension) 08/29/2013  . Murmur 06/26/2013  . Palpitations 06/26/2013  . IBS (irritable bowel syndrome)   . Migraine   . GERD (gastroesophageal reflux disease)   . HSV-2 infection   . IC (interstitial cystitis)     Past Medical History:  Diagnosis Date  . GERD (gastroesophageal reflux disease)   . HSV-2 infection    2008  . IBS (irritable bowel syndrome)   . IC (interstitial cystitis)    2008  . Migraine   . Pregnancy induced hypertension     Past Surgical History:  Procedure Laterality Date  . BREAST SURGERY  2002   right breast biopsy  Benign  . CESAREAN SECTION N/A 08/30/2013   Procedure: CESAREAN SECTION;  Surgeon: Marvene Staff, MD;  Location: [facility];  Service: Obstetrics;  Laterality: N/A;  . KNEE ARTHROSCOPY  3/11, 2012   right knee  . SHOULDER ARTHROSCOPY    . TONSILLECTOMY    . WISDOM TOOTH EXTRACTION      MEDS:  Reviewed in EPIC and UTD  ALLERGIES: Ace inhibitors; Adhesive [tape]; and Latex  Family History  Problem Relation Age of Onset  . Diabetes Mother   . Hyperlipidemia Mother   . Anemia Mother   . Diabetes Maternal Grandmother   . Heart failure Maternal Grandmother   . Diabetes Paternal Grandmother   . Stroke Paternal Grandmother   . Renal Disease Paternal Grandfather   . Heart disease Paternal Grandfather   . Stroke Paternal Grandfather   . Hyperlipidemia Father   . Hypertension Father   . Heart disease Father   . Heart disease Brother   . Prostate cancer Maternal Grandfather   . Hypertension Sister   . Hypertension Sister   . Thyroid disease Sister   . Anemia Maternal Aunt   . Prostate cancer Paternal Uncle     SH:  Single, non smoker  Review of Systems  Gastrointestinal: Positive for nausea.       Bloated  Genitourinary:       Irregular Periods   All other  systems reviewed and are negative.   PHYSICAL EXAMINATION:    BP 124/70 (BP Location: Right Arm, Patient Position: Sitting, Cuff Size: Normal)   Pulse 96   Temp 97.9 F (36.6 C) (Oral)   Resp 14   Ht 5' 3.25" (1.607 m)   Wt 154 lb (69.9 kg)   LMP 09/18/2016   BMI 27.06 kg/m     General appearance: alert, cooperative and appears stated age CV:  Regular rate and rhythm Lungs:  clear to auscultation, no wheezes, rales or rhonchi, symmetric air entry Abdomen: soft, mild LLQ; no rebound or guarding, bowel sounds normal; no masses,  no organomegaly  Pelvic: External genitalia:  no lesions              Urethra:  normal appearing urethra with no masses, tenderness or lesions              Bartholins and Skenes: normal                  Vagina: normal appearing vagina with normal color and discharge, no lesions              Cervix: no lesions              Bimanual Exam:  Uterus:  normal size, contour, position, consistency, mobility, non-tender, no CMT              Adnexa: left adnexal tenderness              Anus:  no lesions  Chaperone was present for exam.  Assessment: LLQ pain, possible ovarian cyst, neg UPT Cycles that are about every 23-25 days.  Cannot use OCPs due to hypertension.  Has not had success with POPs. Contemplating pregnancy so not interested in other treatment options for bleeding at this time.  Plan: Ibuprofen 800mg  every 8 hours prn.  Pt has used some Tramadol 100mg  nightly.  Rx given for #20/0RF. Pt will give update in 48 hours.  I expect symptoms to gradually improve.  If they have not in 48 hours, PUS will be performed.

## 2016-10-23 DIAGNOSIS — H538 Other visual disturbances: Secondary | ICD-10-CM | POA: Diagnosis not present

## 2016-10-23 DIAGNOSIS — H1013 Acute atopic conjunctivitis, bilateral: Secondary | ICD-10-CM | POA: Diagnosis not present

## 2016-10-27 DIAGNOSIS — I10 Essential (primary) hypertension: Secondary | ICD-10-CM | POA: Diagnosis not present

## 2016-11-26 ENCOUNTER — Telehealth: Payer: Self-pay | Admitting: Obstetrics & Gynecology

## 2016-11-26 NOTE — Telephone Encounter (Signed)
Patient called and requested an appointment with Dr. Sabra Heck. She said she has been bleeding since having sex yesterday and she is not due for a menstrual cycle for another 10-11 days.  Last seen: 10/02/16

## 2016-11-26 NOTE — Telephone Encounter (Signed)
Spoke with patient. Patient states that she had intercourse last night and had mild discomfort. Woke up this morning with spotting. When she uses the restroom she is having light bleeding more that spotting, but less than a normal menses. Menses is due in 10-11 days. Denies any pain. Advised will need to be seen for further evaluation. Patient is agreeable. Appointment scheduled for 11/27/2016 at 4 pm with Dr.Miller. Patient is agreeable to date and time. Aware if bleeding increases or develops pain she will need to be seen earlier for evaluation with our office or ER. Patient is agreeable.  Routing to provider for final review. Patient agreeable to disposition. Will close encounter.

## 2016-11-27 ENCOUNTER — Ambulatory Visit (INDEPENDENT_AMBULATORY_CARE_PROVIDER_SITE_OTHER): Payer: 59 | Admitting: Obstetrics & Gynecology

## 2016-11-27 VITALS — BP 124/80 | HR 96 | Temp 98.1°F | Resp 16 | Ht 63.25 in | Wt 150.0 lb

## 2016-11-27 DIAGNOSIS — N926 Irregular menstruation, unspecified: Secondary | ICD-10-CM | POA: Diagnosis not present

## 2016-11-27 LAB — POCT URINE PREGNANCY: Preg Test, Ur: NEGATIVE

## 2016-11-27 NOTE — Progress Notes (Signed)
GYNECOLOGY  VISIT   HPI: 41 y.o. G51P1001 Single African American female here for complaint of vaginal spotting in her underwear that started yesterday morning.  She has been experiencing irregular bleeding over the last year or more.  This started after stopping OCPs (due to worsening hypertension).  She's been on micronor without success.  She is not interested in any long-acting contraception as she is still considering the possibility of another pregnancy.  The most recent spotting/irregular bleeding was also associated with some LLQ pain and lower left back pain.  Wasn't sure if this was due to intercourse as she'd been SA on Sunday (about three days ago).  Prior to the current bleeding, her LMP was 6/18.  This lasted for four days and was normal and then followed by two additional days of light spotting.  Then this current spotting started on 11/25/16 and the flow has picked up like a cycle.    Pt did have ultrasound 9/17 showign normal uterus except for 1.0cm subserosal fibroid, symmetric 5.75mm endometrium and normal ovaries.  She's had no additional imaging since then.  GYNECOLOGIC HISTORY: Patient's last menstrual period was 11/10/2016. Contraception:  Withdraw  Menopausal hormone therapy: none  Patient Active Problem List   Diagnosis Date Noted  . Essential hypertension, benign 02/24/2016  . PIH (pregnancy induced hypertension) 08/29/2013  . Murmur 06/26/2013  . Palpitations 06/26/2013  . IBS (irritable bowel syndrome)   . Migraine   . GERD (gastroesophageal reflux disease)   . HSV-2 infection   . IC (interstitial cystitis)     Past Medical History:  Diagnosis Date  . GERD (gastroesophageal reflux disease)   . HSV-2 infection    2008  . IBS (irritable bowel syndrome)   . IC (interstitial cystitis)    2008  . Migraine   . Pregnancy induced hypertension     Past Surgical History:  Procedure Laterality Date  . BREAST SURGERY  2002   right breast biopsy Benign  .  CESAREAN SECTION N/A 08/30/2013   Procedure: CESAREAN SECTION;  Surgeon: Marvene Staff, MD;  Location: [facility];  Service: Obstetrics;  Laterality: N/A;  . KNEE ARTHROSCOPY  3/11, 2012   right knee  . SHOULDER ARTHROSCOPY    . TONSILLECTOMY    . WISDOM TOOTH EXTRACTION      MEDS:  Reviewed in EPIC and UTD  ALLERGIES: Ace inhibitors; Adhesive [tape]; and Latex  Family History  Problem Relation Age of Onset  . Diabetes Mother   . Hyperlipidemia Mother   . Anemia Mother   . Diabetes Maternal Grandmother   . Heart failure Maternal Grandmother   . Diabetes Paternal Grandmother   . Stroke Paternal Grandmother   . Renal Disease Paternal Grandfather   . Heart disease Paternal Grandfather   . Stroke Paternal Grandfather   . Hyperlipidemia Father   . Hypertension Father   . Heart disease Father   . Heart disease Brother   . Prostate cancer Maternal Grandfather   . Hypertension Sister   . Hypertension Sister   . Thyroid disease Sister   . Anemia Maternal Aunt   . Prostate cancer Paternal Uncle     SH:  Single (long term relationship), non smoker  Review of Systems  Genitourinary:       Menstrual cycle changes Unscheduled bleeding  Pain with intercourse   All other systems reviewed and are negative.   PHYSICAL EXAMINATION:    BP 124/80 (BP Location: Right Arm, Patient Position: Sitting, Cuff Size: Normal)  Pulse 96   Temp 98.1 F (36.7 C) (Oral)   Resp 16   Ht 5' 3.25" (1.607 m)   Wt 150 lb (68 kg)   LMP 11/10/2016   BMI 26.36 kg/m     General appearance: alert, cooperative and appears stated age CV:  Regular rate and rhythm Lungs:  clear to auscultation, no wheezes, rales or rhonchi, symmetric air entry Abdomen: soft, non-tender; bowel sounds normal; no masses,  no organomegaly  Pelvic: External genitalia:  no lesions              Urethra:  normal appearing urethra with no masses, tenderness or lesions              Bartholins and Skenes: normal                  Vagina: normal appearing vagina with normal color and discharge, no lesions              Cervix: no lesions              Bimanual Exam:  Uterus:  normal size, contour, position, consistency, mobility, non-tender              Adnexa: no mass, fullness, tenderness              Anus:  no lesions  Chaperone was present for exam.  Assessment: Irregular bleeding that has been present since stopping combination OCPs Failed micronor use Not interested in any other long acting contraception at this time  Plan: Pt will monitor bleeding.  Feel should repeat PUS at about 1 year from prior one just to ensure no significant changes. Have encouraged pt to consider longer acting contraception options like IUD to help with bleeding while she continues to contemplate additional pregnancy.   ~15 minutes spent with patient >50% of time was in face to face discussion of above.

## 2016-12-01 ENCOUNTER — Encounter: Payer: Self-pay | Admitting: Obstetrics & Gynecology

## 2016-12-14 DIAGNOSIS — B309 Viral conjunctivitis, unspecified: Secondary | ICD-10-CM | POA: Diagnosis not present

## 2016-12-14 DIAGNOSIS — H5711 Ocular pain, right eye: Secondary | ICD-10-CM | POA: Diagnosis not present

## 2016-12-20 DIAGNOSIS — H538 Other visual disturbances: Secondary | ICD-10-CM | POA: Diagnosis not present

## 2016-12-20 DIAGNOSIS — H5711 Ocular pain, right eye: Secondary | ICD-10-CM | POA: Diagnosis not present

## 2016-12-20 DIAGNOSIS — H5213 Myopia, bilateral: Secondary | ICD-10-CM | POA: Diagnosis not present

## 2016-12-25 DIAGNOSIS — G43111 Migraine with aura, intractable, with status migrainosus: Secondary | ICD-10-CM | POA: Diagnosis not present

## 2016-12-25 DIAGNOSIS — G43839 Menstrual migraine, intractable, without status migrainosus: Secondary | ICD-10-CM | POA: Diagnosis not present

## 2016-12-25 DIAGNOSIS — G43719 Chronic migraine without aura, intractable, without status migrainosus: Secondary | ICD-10-CM | POA: Diagnosis not present

## 2017-01-10 ENCOUNTER — Telehealth: Payer: Self-pay | Admitting: Obstetrics & Gynecology

## 2017-01-10 DIAGNOSIS — N926 Irregular menstruation, unspecified: Secondary | ICD-10-CM

## 2017-01-10 NOTE — Telephone Encounter (Signed)
Spoke with patient. Patient reports spotting after intercourse today, not the first time, has been ongoing. Notices spotting immediately after and sometimes may occur up to 3 days later. LMP 12/17/16, " 2 menses in July". Denies pain or any other GYN symptoms. Last OV 11/27/16 for irregular bleeidng. No contraceptives.   Reviewed OV notes dated 11/27/16. Recommended PUS with Dr. Sabra Heck for further evaluation.   Patient scheduled for PUS on 12/17/16 at 3pm with consult to follow with Dr. Sabra Heck at 3:30 pm. Advised patient Dr. Sabra Heck will review, will return call with any additional recommendations, patient verbalizes understanding.  Order placed for PUS.  Routing to provider for final review. Patient is agreeable to disposition. Will close encounter.   Cc: Theresia Lo

## 2017-01-10 NOTE — Telephone Encounter (Signed)
Patient had some bleeding after intercourse and would like to talk with a nurse.

## 2017-01-11 ENCOUNTER — Telehealth: Payer: Self-pay | Admitting: Obstetrics & Gynecology

## 2017-01-11 NOTE — Telephone Encounter (Signed)
Call to patient to review benefit for Ultrasound scheduled 01-17-17 with Dr. Sabra Heck. Left voicemail for patient to return call to review.

## 2017-01-17 ENCOUNTER — Other Ambulatory Visit: Payer: Self-pay

## 2017-01-17 ENCOUNTER — Other Ambulatory Visit: Payer: Self-pay | Admitting: Obstetrics & Gynecology

## 2017-01-22 ENCOUNTER — Ambulatory Visit (INDEPENDENT_AMBULATORY_CARE_PROVIDER_SITE_OTHER): Payer: 59 | Admitting: Obstetrics & Gynecology

## 2017-01-22 ENCOUNTER — Ambulatory Visit (INDEPENDENT_AMBULATORY_CARE_PROVIDER_SITE_OTHER): Payer: 59

## 2017-01-22 ENCOUNTER — Encounter: Payer: Self-pay | Admitting: Obstetrics & Gynecology

## 2017-01-22 VITALS — BP 124/86 | HR 64 | Resp 12 | Wt 151.0 lb

## 2017-01-22 DIAGNOSIS — N926 Irregular menstruation, unspecified: Secondary | ICD-10-CM

## 2017-01-22 DIAGNOSIS — D252 Subserosal leiomyoma of uterus: Secondary | ICD-10-CM

## 2017-01-22 NOTE — Progress Notes (Signed)
41 y.o. G1P1001 Single African American female here for pelvic ultrasound due to persistent DUB.  Pt and I have discussed this several times.  She cannot be on estrogen containing OCPs due to increased issues with hypertension.  She does not want to take POPs as she's done this previously and didn't feel like it really helped.  She is still contemplating pregnancy so I do not think Depo Provera is a good idea as this times.  She does not want to use an IUD even though we've had this discussion now over a year and the IUD could have already improved irregular bleeding.  Bleeding over the past few months has been: June 16--like a cycle July 2nd --post coital bleeding July 23rd--like a cycle August 16--like a cycle  Patient's last menstrual period was 01/10/2017.  Contraception: none  Findings:  UTERUS: 7.0 x 4.1 x 3.4cm with 1.0cm x 0.57mm fundal fibroid EMS: 5.91mm ADNEXA: Left ovary: 2.6 x 1.4 x 1.2cm       Right ovary: 2.9 x 2.0 x 1.9cm 1.5cm resolving corpus luteal cyst (day 13) CUL DE SAC: no free fluid  Discussion:  With finding of corpus luteal cyst, d/w pt likelihood that her cycles are much shorter than 28 day cycle, maybe even less than 20 days.  This would explain some of her "irregular bleeding".  She's been using a phon app to keep track of her cycles that is tracking normal cycles at 28 days.  I reviewed this with her. 18-21 day cycle would actually mean her cycles are fairly regular but she is ovulating early.  She wants to know if there is anything else that could be done for the bleeding.  Hysteroscopy with D&C is the only other suggestion that I have.  Procedure, risks and benefits discussed.  She is interested in watching her cycles over the next couple of months thinking about 18-21 days as being "normal".  If cycles occur on this schedule, she is likely not going to want to proceed with anything else at this time.    Assessment:  DUB Small uterine fibroid  Plan:  Pt is going  to continue monitoring cycles and consider hysteroscopy with polyp resection.    ~25 minutes spent with patient >50% of time was in face to face discussion of above.

## 2017-03-02 DIAGNOSIS — E785 Hyperlipidemia, unspecified: Secondary | ICD-10-CM | POA: Diagnosis not present

## 2017-03-02 DIAGNOSIS — I10 Essential (primary) hypertension: Secondary | ICD-10-CM | POA: Diagnosis not present

## 2017-03-21 DIAGNOSIS — G43111 Migraine with aura, intractable, with status migrainosus: Secondary | ICD-10-CM | POA: Diagnosis not present

## 2017-03-21 DIAGNOSIS — G43839 Menstrual migraine, intractable, without status migrainosus: Secondary | ICD-10-CM | POA: Diagnosis not present

## 2017-03-21 DIAGNOSIS — G43719 Chronic migraine without aura, intractable, without status migrainosus: Secondary | ICD-10-CM | POA: Diagnosis not present

## 2017-05-07 ENCOUNTER — Ambulatory Visit (INDEPENDENT_AMBULATORY_CARE_PROVIDER_SITE_OTHER): Payer: 59 | Admitting: Obstetrics & Gynecology

## 2017-05-07 ENCOUNTER — Other Ambulatory Visit: Payer: Self-pay

## 2017-05-07 ENCOUNTER — Encounter: Payer: Self-pay | Admitting: Obstetrics & Gynecology

## 2017-05-07 VITALS — BP 138/88 | HR 90 | Resp 18 | Ht 63.25 in | Wt 152.0 lb

## 2017-05-07 DIAGNOSIS — Z0000 Encounter for general adult medical examination without abnormal findings: Secondary | ICD-10-CM | POA: Diagnosis not present

## 2017-05-07 DIAGNOSIS — N926 Irregular menstruation, unspecified: Secondary | ICD-10-CM

## 2017-05-07 DIAGNOSIS — Z01419 Encounter for gynecological examination (general) (routine) without abnormal findings: Secondary | ICD-10-CM

## 2017-05-07 DIAGNOSIS — N912 Amenorrhea, unspecified: Secondary | ICD-10-CM

## 2017-05-07 LAB — POCT URINALYSIS DIPSTICK
BILIRUBIN UA: NEGATIVE
Glucose, UA: NEGATIVE
KETONES UA: NEGATIVE
Leukocytes, UA: NEGATIVE
NITRITE UA: NEGATIVE
PH UA: 7 (ref 5.0–8.0)
PROTEIN UA: NEGATIVE
RBC UA: NEGATIVE
UROBILINOGEN UA: 0.2 U/dL

## 2017-05-07 LAB — POCT URINE PREGNANCY: Preg Test, Ur: POSITIVE — AB

## 2017-05-07 NOTE — Progress Notes (Signed)
41 y.o. G1P1001 SingleAfrican AmericanF here for annual exam.  Doing well.  Got engaged on cruise in September.  Set a date for October 2020.  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   Having increases HSV outbreaks.    Seeing Dr. Criss Rosales on Thursday.    Missed cycle this month.  UPT here is faintly positive.  Advised pt.  Will need to follow HCG levels.     Patient's last menstrual period was 04/08/2017.          Sexually active: Yes.    The current method of family planning is rhythm.    Exercising: Yes.    walking Smoker:  no  Health Maintenance: Pap:  01/26/15 neg. HR HPV:neg  History of abnormal Pap:  no MMG:  05/17/15 [place] right. BIRADS2:benign. F/u 3 years.  Colonoscopy:  never BMD:   Never TDaP:  2015 Screening Labs: PCP  HQ:[facility]   reports that  has never smoked. she has never used smokeless tobacco. She reports that she drinks alcohol. She reports that she does not use drugs.  Past Medical History:  Diagnosis Date  . GERD (gastroesophageal reflux disease)   . HSV-2 infection    2008  . IBS (irritable bowel syndrome)   . IC (interstitial cystitis)    2008  . Migraine   . Pregnancy induced hypertension     Past Surgical History:  Procedure Laterality Date  . BREAST SURGERY  2002   right breast biopsy Benign  . CESAREAN SECTION N/A 08/30/2013   Procedure: CESAREAN SECTION;  Surgeon: Marvene Staff, MD;  Location: [facility];  Service: Obstetrics;  Laterality: N/A;  . KNEE ARTHROSCOPY  3/11, 2012   right knee  . SHOULDER ARTHROSCOPY    . TONSILLECTOMY    . WISDOM TOOTH EXTRACTION      Current Outpatient Medications  Medication Sig Dispense Refill  . [facility] ALLERGY RELIEF D 60-120 MG 12 hr tablet Take 1 tablet by mouth 2 (two) times daily.  3  . cyclobenzaprine (FLEXERIL) 10 MG tablet Take 10 mg by mouth at bedtime as needed.  0  . fluticasone (FLONASE) 50 MCG/ACT nasal spray Place 2 sprays into both nostrils 2 (two)  times daily as needed for allergies.     Marland Kitchen ibuprofen (ADVIL,MOTRIN) 800 MG tablet Take 1 tablet (800 mg total) by mouth every 8 (eight) hours as needed. 30 tablet 0  . lisdexamfetamine (VYVANSE) 40 MG capsule Take 40 mg by mouth every morning.    Renda Rolls AD 4-10-325 MG TABS Take 1 tablet by mouth as needed.   2  . olopatadine (PATANOL) 0.1 % ophthalmic solution INSTILL 1 DROP INTO BOTH EYES TWICE A DAY  3  . Prenatal Vit-Fe Fumarate-FA (PRENATAL MULTIVITAMIN) TABS tablet Take 1 tablet by mouth daily at 12 noon.    . ranitidine (ZANTAC) 150 MG tablet Take 1 tablet (150 mg total) by mouth 2 (two) times daily. 60 tablet 2  . rizatriptan (MAXALT) 10 MG tablet     . telmisartan-hydrochlorothiazide (MICARDIS HCT) 40-12.5 MG tablet Take 1 tablet by mouth every morning.  1  . tiZANidine (ZANAFLEX) 4 MG tablet     . valACYclovir (VALTREX) 500 MG tablet Take 1 tablet (500 mg total) by mouth 2 (two) times daily. 180 tablet 4  . zonisamide (ZONEGRAN) 50 MG capsule      No current facility-administered medications for this visit.     Family  History  Problem Relation Age of Onset  . Diabetes Mother   . Hyperlipidemia Mother   . Anemia Mother   . Diabetes Maternal Grandmother   . Heart failure Maternal Grandmother   . Diabetes Paternal Grandmother   . Stroke Paternal Grandmother   . Renal Disease Paternal Grandfather   . Heart disease Paternal Grandfather   . Stroke Paternal Grandfather   . Hyperlipidemia Father   . Hypertension Father   . Heart disease Father   . Heart disease Brother   . Prostate cancer Maternal Grandfather   . Hypertension Sister   . Hypertension Sister   . Thyroid disease Sister   . Anemia Maternal Aunt   . Prostate cancer Paternal Uncle     ROS:  Pertinent items are noted in HPI.  Otherwise, a comprehensive ROS was negative.  Exam:   BP 138/88 (BP Location: Right Arm, Patient Position: Sitting, Cuff Size: Normal)   Pulse 90   Resp 18   Ht 5' 3.25" (1.607 m)   Wt  152 lb (68.9 kg)   LMP 04/08/2017   BMI 26.71 kg/m   Weight change: -6#  Height: 5' 3.25" (160.7 cm)  Ht Readings from Last 3 Encounters:  05/07/17 5' 3.25" (1.607 m)  11/27/16 5' 3.25" (1.607 m)  10/02/16 5' 3.25" (1.607 m)    General appearance: alert, cooperative and appears stated age Head: Normocephalic, without obvious abnormality, atraumatic Neck: no adenopathy, supple, symmetrical, trachea midline and thyroid normal to inspection and palpation Lungs: clear to auscultation bilaterally Breasts: normal appearance, no masses or tenderness Heart: regular rate and rhythm Abdomen: soft, non-tender; bowel sounds normal; no masses,  no organomegaly Extremities: extremities normal, atraumatic, no cyanosis or edema Skin: Skin color, texture, turgor normal. No rashes or lesions Lymph nodes: Cervical, supraclavicular, and axillary nodes normal. No abnormal inguinal nodes palpated Neurologic: Grossly normal   Pelvic: External genitalia:  no lesions              Urethra:  normal appearing urethra with no masses, tenderness or lesions              Bartholins and Skenes: normal                 Vagina: normal appearing vagina with normal color and discharge, no lesions              Cervix: no lesions              Pap taken: No. Bimanual Exam:  Uterus:  normal size, contour, position, consistency, mobility, non-tender              Adnexa: normal adnexa and no mass, fullness, tenderness               Rectovaginal: Confirms               Anus:  normal sphincter tone, no lesions  Chaperone was present for exam.  A:  Well Woman with normal exam Hypertension Missed cycle Recurrent HSV H/o irregular cycles  P:   Mammogram guidelines reviewed pap smear not indicated today D/W pt medication.  With faint positive pregnancy testing, will not make any changes today. HCG level will be obtained today.  MBT O+. Return annually or prn

## 2017-05-08 ENCOUNTER — Telehealth: Payer: Self-pay | Admitting: Obstetrics & Gynecology

## 2017-05-08 LAB — BETA HCG QUANT (REF LAB): hCG Quant: 151 m[IU]/mL

## 2017-05-08 NOTE — Telephone Encounter (Signed)
Please just tell her that the value is 151--the numbers do not correlate well with gestational age.  She needs to have this repeated on Friday for quantitative HCG.  Thanks.

## 2017-05-08 NOTE — Telephone Encounter (Signed)
Spoke with patient. Advised of message as seen below from [place]. Patient verbalizes understanding. Patient is scheduled for appointment on 05/10/2017 at 11:30 am. Encounter closed.

## 2017-05-08 NOTE — Telephone Encounter (Signed)
Dr.Miller okay to advised patient beta hcg is 151, around [redacted] week pregnant. Please advise additional recommendations.

## 2017-05-08 NOTE — Telephone Encounter (Signed)
Patient called to check on the status of her lab test done yesterday. She said she was told she'd get her results today.

## 2017-05-10 ENCOUNTER — Other Ambulatory Visit: Payer: Self-pay

## 2017-05-10 ENCOUNTER — Other Ambulatory Visit (INDEPENDENT_AMBULATORY_CARE_PROVIDER_SITE_OTHER): Payer: 59

## 2017-05-10 ENCOUNTER — Encounter: Payer: Self-pay | Admitting: Obstetrics & Gynecology

## 2017-05-10 DIAGNOSIS — N926 Irregular menstruation, unspecified: Secondary | ICD-10-CM | POA: Diagnosis not present

## 2017-05-10 DIAGNOSIS — Z0000 Encounter for general adult medical examination without abnormal findings: Secondary | ICD-10-CM

## 2017-05-10 DIAGNOSIS — I10 Essential (primary) hypertension: Secondary | ICD-10-CM | POA: Diagnosis not present

## 2017-05-10 NOTE — Addendum Note (Signed)
Addended by: Charmayne Sheer on: 05/10/2017 04:48 PM   Modules accepted: Orders

## 2017-05-10 NOTE — Addendum Note (Signed)
Addended by: Gwendlyn Deutscher on: 05/10/2017 04:36 PM   Modules accepted: Orders

## 2017-05-11 LAB — BETA HCG QUANT (REF LAB): HCG QUANT: 206 m[IU]/mL

## 2017-05-13 ENCOUNTER — Telehealth: Payer: Self-pay | Admitting: *Deleted

## 2017-05-13 ENCOUNTER — Other Ambulatory Visit: Payer: Self-pay

## 2017-05-13 ENCOUNTER — Encounter: Payer: Self-pay | Admitting: Obstetrics & Gynecology

## 2017-05-13 ENCOUNTER — Other Ambulatory Visit (INDEPENDENT_AMBULATORY_CARE_PROVIDER_SITE_OTHER): Payer: 59

## 2017-05-13 DIAGNOSIS — R799 Abnormal finding of blood chemistry, unspecified: Secondary | ICD-10-CM

## 2017-05-13 DIAGNOSIS — IMO0002 Reserved for concepts with insufficient information to code with codable children: Secondary | ICD-10-CM

## 2017-05-13 LAB — BETA HCG QUANT (REF LAB): HCG QUANT: 179 m[IU]/mL

## 2017-05-13 NOTE — Addendum Note (Signed)
Addended by: Gwendlyn Deutscher on: 05/13/2017 11:37 AM   Modules accepted: Orders

## 2017-05-13 NOTE — Telephone Encounter (Signed)
Patient notified HCG level is going down and to keep PUS appointment for tomorrow.

## 2017-05-13 NOTE — Telephone Encounter (Signed)
I have already communicated with the pt via mychart regarding this.  I advised her to restart her medication for now.

## 2017-05-13 NOTE — Telephone Encounter (Signed)
-----   Message from Megan Salon, MD sent at 05/13/2017  7:25 AM EST ----- Please contact pt.  HCG is not going up appropriately.  She needs repeat HCG and blood type today if possible and ultrasound tomorrow.  Please give precautions for ectopic pregnancy.    CC:  Denise [name].

## 2017-05-13 NOTE — Telephone Encounter (Signed)
My Chart message from patient:  ----- Message from [place], Generic sent at 05/13/2017 9:15 AM EST -----    Good morning Dr. Sabra Heck,    Is there anyway your office can communicate with Dr. Darlyne Russian office? When I went last Thursday for a follow-up visit I told her what was going on. She told me to stop taking my blood-pressure medicine until we received the results from Friday lab work. I have not been on any blood pressure medicine since and think I might need to be on something until we know exactly what's going on with me.    Thank you,    Denise [name]

## 2017-05-13 NOTE — Telephone Encounter (Signed)
Kennyth Lose calling with STAT lab results:   Beta HCG Quant: 179  Will fax results to [phone].  Routing to Dr. Sabra Heck  Cc: Lamont Snowball, RN

## 2017-05-13 NOTE — Telephone Encounter (Signed)
Call to patient. (She has already called in and scheduled lab appointment for 1130) Advised calling to review lab results from Dr Sabra Heck and recommendation for ultrasound.  Discussed that quant level is not progressing as we would expect and need to recheck level for further evaluation. Would also recommend ultrasound tomorrow, scheduled for 05-14-17 at 3pm. Patient denies pain or bleeding. Has some backache when on her feet for longer time. Has headaches. PCP took her off BP meds on Thursday due to pregancy. Was told to contact us regarding this. (Sent My Chart message). Advised will review this with Dr Sabra Heck. Patient coming for labs at 1115.

## 2017-05-13 NOTE — Telephone Encounter (Signed)
Please let her know HCG level is going down.  I still want her to come for the ultrasound tomorrow.  Looks like she may miscarry but need ultrasound to make sure not an ectopic pregnancy.  Thanks.

## 2017-05-14 ENCOUNTER — Ambulatory Visit (INDEPENDENT_AMBULATORY_CARE_PROVIDER_SITE_OTHER): Payer: 59

## 2017-05-14 ENCOUNTER — Ambulatory Visit: Payer: 59 | Admitting: Obstetrics & Gynecology

## 2017-05-14 VITALS — BP 140/76 | HR 100 | Resp 16 | Ht 63.25 in | Wt 152.0 lb

## 2017-05-14 DIAGNOSIS — IMO0002 Reserved for concepts with insufficient information to code with codable children: Secondary | ICD-10-CM

## 2017-05-14 DIAGNOSIS — R799 Abnormal finding of blood chemistry, unspecified: Secondary | ICD-10-CM | POA: Diagnosis not present

## 2017-05-14 LAB — ABO AND RH: RH TYPE: POSITIVE

## 2017-05-14 NOTE — Progress Notes (Signed)
41 y.o. G2P1001 Single African American female here for pelvic ultrasound due to inappropriately rising HCGs.  Pt seen for [name] on 05/07/17 and cycle was late.  She has a hx of irregular cycles so UPT was obtained. This was faintly positive.  Pt was not having any symptoms of early pregnancy.  HCGs were: 151 on 12/11 206 on 05/10/17 179 on 05/13/17.  Denies pelvic pain or bleeding.  No nausea or breast tenderness noted  Patient's last menstrual period was 04/08/2017 (exact date).  Contraception: rhythm method  Findings:  UTERUS: single gestational sac with echogenic fluid present within sac noted, no yolk sac noted ADNEXA: Left ovary: 3.0 x 1.6 x 2.5cm with 1.7 x 1.5cm collapsing corpus luteal cyst and 1.2 x 0.9cm simple avascular cyst       Right ovary: 2.1 x 1.3 x 1.1cm CUL DE SAC: no free fluid  Discussion:  Findings reviewed.  Although gestational sac is c/w dating in size, it contained echogenic fluid which does not appear normal.  Also, HCG levels are not normal and corpus luteal cyst appears to be collapsing.  No evidence of ectopic pregnancy.  Pt and significant other present together.  Findings reviewed.  Doubtful of normal pregnancy developing.  Feel, need to continue following HCGs.  May need to consider MTX if HCG levels rise.  Miscarriage and bleeding precautions discussed.  Pt had many questions and all were answered.  She is very tearful and upset.  Support given.  Will plan to repeat HCG state on 05/16/17.  Assessment:  Abnormal ultrasound with early pregnancy Abnormally rising HCGs MBT O+  Plan:  Results of HCG will be called to pt.  If decreasing, will just follow down.  If increasing, will consider MTX.  Pt comfortable with plan.  ~30 minutes spent with patient >50% of time was in face to face discussion of above.

## 2017-05-15 ENCOUNTER — Telehealth: Payer: Self-pay | Admitting: Obstetrics & Gynecology

## 2017-05-15 ENCOUNTER — Encounter: Payer: Self-pay | Admitting: Obstetrics & Gynecology

## 2017-05-15 DIAGNOSIS — IMO0002 Reserved for concepts with insufficient information to code with codable children: Secondary | ICD-10-CM

## 2017-05-15 DIAGNOSIS — R799 Abnormal finding of blood chemistry, unspecified: Secondary | ICD-10-CM

## 2017-05-15 NOTE — Telephone Encounter (Signed)
Call reviewed with Dr Sabra Heck. Recommendations for stat quant BHCG 1st thing tomorrow am and NPO after breakfast. Dr Sabra Heck will review options with patient following lab result.  Call to patient, left message to call back.

## 2017-05-15 NOTE — Telephone Encounter (Signed)
Call from patient. Advised of recommendations from Dr Sabra Heck. Advised to eat early am breakfast and hydrate tonight and tomorrow am prior to lab then NPO.  Advised patient will receive call tomorrow to discuss options of D&C, methotrexate versus spontaneous miscarriage and pros and cons of each.  Patient states she is anxious to have the emotional part of this be over.  Lab appointment adjusted to 0815 tomorrow.   Routing to provider for final review. Patient agreeable to disposition. Will close encounter.

## 2017-05-15 NOTE — Telephone Encounter (Signed)
-----   Message from [place], Generic sent at 05/15/2017 9:43 AM EST -----    Good morning Dr. Sabra Heck,    Will your office be able to send my primary doctor Dr. Darlyne Russian office a summary of what has and is going on with me? In addition, since we discussed prior about the possibility of doing a D&C to help my periods, can we still go ahead and do that instead of me waiting for the miscarriage to happen?    Thank you,    Jackelyn Poling

## 2017-05-15 NOTE — Telephone Encounter (Signed)
Spoke with patient in regards to MyChart message as seen below.   Request the following to PCP -Dr. Criss Rosales:  1. Labs dated 05/07/17, 05/10/17,05/13/17  2. Copy of OV dated 05/07/17 and 05/14/17  Advised patient OV dated 05/07/17 faxed via Epic on 05/07/17 to Dr. Criss Rosales. Will Fax labs and OV dated 12/18.   Will forward request regarding D&C to Dr. Sabra Heck for review and return call with recommendations. Advised patient Dr. Sabra Heck is out of the office today, response may not be immediate. Patient verbalizes understanding and is agreeable.   Dr. Sabra Heck -please review and advise on D&C.

## 2017-05-16 ENCOUNTER — Encounter: Payer: Self-pay | Admitting: Obstetrics & Gynecology

## 2017-05-16 ENCOUNTER — Telehealth: Payer: Self-pay | Admitting: Obstetrics & Gynecology

## 2017-05-16 ENCOUNTER — Telehealth: Payer: Self-pay | Admitting: *Deleted

## 2017-05-16 ENCOUNTER — Other Ambulatory Visit (INDEPENDENT_AMBULATORY_CARE_PROVIDER_SITE_OTHER): Payer: 59

## 2017-05-16 DIAGNOSIS — O200 Threatened abortion: Secondary | ICD-10-CM

## 2017-05-16 LAB — BETA HCG QUANT (REF LAB): HCG QUANT: 67 m[IU]/mL

## 2017-05-16 MED ORDER — FLUCONAZOLE 150 MG PO TABS: ORAL_TABLET | ORAL | 0 refills | Status: DC

## 2017-05-16 NOTE — Telephone Encounter (Signed)
Lab results reviewed by Dr Sabra Heck.  Call to patient. Discussed results and recommendations. Discussed expectant management and bleeding/pain precautions given. Follow-up lab appointment scheduled for 05-23-17.   Support given.   Routing to provider for final review.  Will close encounter.

## 2017-05-16 NOTE — Telephone Encounter (Signed)
Patient is having yeast symptoms and is not sure if it could be part of what else is going on.

## 2017-05-16 NOTE — Telephone Encounter (Signed)
Spoke with patient. Reports white, cottage cheese d/c when wiping, started a few days ago.  Denies odor, itching, burning, pain, bleeding or urinary symptoms.   Verified pharmacy on file.   Advised will review with Dr. Sabra Heck and return call with recommendations, patient is agreeable.   Dr. Sabra Heck -please advise?

## 2017-05-16 NOTE — Telephone Encounter (Signed)
Reviewed with Dr. Sabra Heck -Diflucan 150 mg po now, may repeat in 72 hours if symptoms still present. #2/0RF  Spoke with patient, advised as seen above per Dr. Sabra Heck. [facility] Patient verbalizes understanding and is agreeable.   Routing to provider for final review. Patient is agreeable to disposition. Will close encounter.

## 2017-05-16 NOTE — Telephone Encounter (Signed)
Kennyth Lose from [facility] with STAT labs.  Beta Hcg quant: 67  Will fax results to [phone].  Routing to Dr. Sabra Heck  Cc: Lamont Snowball, RN

## 2017-05-17 ENCOUNTER — Telehealth: Payer: Self-pay | Admitting: *Deleted

## 2017-05-17 ENCOUNTER — Encounter: Payer: Self-pay | Admitting: Obstetrics & Gynecology

## 2017-05-17 NOTE — Telephone Encounter (Signed)
Call to patient. States started bleeding around 12. Has had cramps for last few days. Bleeding is heavier than a normal first cycle day for her but nothing excessive. (states her normal first cycle day is very minimal) Patient is currently still at work and has not been to restroom since bleeding started. Went to check pad while on phone and states "bleeding is ok." Discussed Motrin for cramps, she has RX for 600 mg. Advised may take 1 tab Q 6 hrs with food.  Bleeding precautions reviewed and advised may call MD on call or go directly to [facility] for excessive bleeding or pain. To keep scheduled appointment for 05-23-17.    Routing to provider for final review. Patient agreeable to disposition. Will close encounter.

## 2017-05-17 NOTE — Telephone Encounter (Signed)
My Chart message from patient:  ----- Message from [place], Generic sent at 05/17/2017 1:10 PM EST -----    Faythe Ghee, thank you Verline Lema.  ----- Message -----  From: Nurse Naaman Plummer  Sent: 05/17/2017 12:39 PM EST  To: Denise [name]  Subject: RE: Visit Follow-Up Question  Denise [name],    Thank you for the update. I will ensure Dr.Miller receives your message.    Sincerely,    Reesa Chew, RN        ----- Message -----   From: Denise [name]   Sent: 05/17/2017 12:15 PM EST    To: Megan Salon, MD  Subject: Visit Follow-Up Question    Hi Dr. Sabra Heck,    I was instructed to contact you once I started bleeding, well I just started!    Thank you,    Denise [name]

## 2017-05-23 ENCOUNTER — Other Ambulatory Visit (INDEPENDENT_AMBULATORY_CARE_PROVIDER_SITE_OTHER): Payer: 59

## 2017-05-23 ENCOUNTER — Other Ambulatory Visit: Payer: Self-pay | Admitting: Obstetrics & Gynecology

## 2017-05-23 DIAGNOSIS — Z0289 Encounter for other administrative examinations: Secondary | ICD-10-CM

## 2017-05-23 DIAGNOSIS — N912 Amenorrhea, unspecified: Secondary | ICD-10-CM

## 2017-05-23 LAB — BETA HCG QUANT (REF LAB): hCG Quant: 2 m[IU]/mL

## 2017-05-24 ENCOUNTER — Telehealth: Payer: Self-pay | Admitting: *Deleted

## 2017-05-24 NOTE — Telephone Encounter (Signed)
Spoke with patient, advised of beta hcg results dated 05/23/17, per Dr. Sabra Heck. Patient verbalizes understanding and is agreeable.   Results to scan.   Routing to provider for final review. Patient is agreeable to disposition. Will close encounter.

## 2017-05-29 ENCOUNTER — Encounter: Payer: Self-pay | Admitting: Obstetrics & Gynecology

## 2017-05-30 ENCOUNTER — Telehealth: Payer: Self-pay | Admitting: Obstetrics & Gynecology

## 2017-05-30 NOTE — Telephone Encounter (Signed)
-----   Message from [place], Generic sent at 05/29/2017 4:12 PM EST -----    Good afternoon Dr. Sabra Heck,    I have had a couple of hot flashes recently is this due to my hormones being off balance? Should I be concerned?

## 2017-05-30 NOTE — Telephone Encounter (Signed)
Spoke with patient and she states she has had less than 5 hot flashes recently and wanted to make sure this was normal for what she's recently been through. She denies any abdominal pain or fever and states only having some brownish light discharge/bleeding. Advised I felt this was probably normal with hormonal changes with early pregnancy and abnormal HCG levels, which are now normal. Will route to Dr.Miller and call with further recommendation. Routed to provider

## 2017-05-31 NOTE — Telephone Encounter (Signed)
Yes, I would expect some hot flashes with her hormonal changes.  If continues, I would like to check her estrogen level so please have her monitor and call back if continues.

## 2017-05-31 NOTE — Telephone Encounter (Signed)
Spoke with patient and notified some hot flashes are normal with her hormonal changes, advised her if this continues to please give [place] a call back and would like to check her estrogen level. Patient voices understanding.

## 2017-06-03 ENCOUNTER — Telehealth: Payer: Self-pay | Admitting: Obstetrics & Gynecology

## 2017-06-03 NOTE — Telephone Encounter (Signed)
Forms forwarded to Lamont Snowball, RN to review with provider.   cc: Lamont Snowball, RN

## 2017-06-03 NOTE — Telephone Encounter (Signed)
Return call to patient. Requesting FMLA for 12-18, 12-20 and 05-17-17.  Then intermittent leave for 2-3 months due to emotional stress of her miscarriage as discussed with Dr Sabra Heck.  Advised that will need to review this with provider and call her back. FMLA is usually only for more than 3 days. Patient then requests for all MD appointments, 12-11, 12-14, 12-17 and 12-27.

## 2017-06-03 NOTE — Telephone Encounter (Signed)
Patient checking on forms she dropped off.  Routing to [employer]

## 2017-06-12 ENCOUNTER — Encounter: Payer: Self-pay | Admitting: Obstetrics & Gynecology

## 2017-06-12 ENCOUNTER — Telehealth: Payer: Self-pay | Admitting: Obstetrics & Gynecology

## 2017-06-12 NOTE — Telephone Encounter (Signed)
Patient has sent My Chart message requesting update on FMLA forms.  Please advise on how/ if these should be completed.

## 2017-06-12 NOTE — Telephone Encounter (Signed)
-----   Message from [place], Generic sent at 06/12/2017 10:21 AM EST -----    Good morning Dr. Sabra Heck,    I am checking on the status of my FMLA papers that I dropped off on 05/23/17, when I had to come for a follow-up blood work. I spoke to someone from your office I think the first week of January in reference to the [facility] forms but, I am not for sure if they were completed or not and they were due back to my HR office on 06/03/17. Can I please get a status update?    Thank you,    Denise [name]

## 2017-06-17 ENCOUNTER — Encounter: Payer: Self-pay | Admitting: Obstetrics & Gynecology

## 2017-06-17 ENCOUNTER — Telehealth: Payer: Self-pay | Admitting: Obstetrics & Gynecology

## 2017-06-17 NOTE — Telephone Encounter (Signed)
Spoke with patient in regards to completed FLMA forms. Patient requested the completed forms be faxed to her employer, Rogelia Boga with [employer], to the fax number listed on FMLA form. Completed forms have been faxed as requested, to fax number [phone]. Fax transmittal has been received showing transmittal was successful. Ok to close   cc: Dr Sabra Heck  cc: Lamont Snowball, RN

## 2017-06-17 NOTE — Telephone Encounter (Signed)
Chart reviewed with Dr Sabra Heck. FMLA forms completed.

## 2017-06-17 NOTE — Telephone Encounter (Signed)
Spoke with patient.  Advised of message as seen below from [place]. Patient verbalizes understanding. Will take UPT and call if positive.   Routing to provider for final review. Patient agreeable to disposition. Will close encounter.

## 2017-06-17 NOTE — Telephone Encounter (Signed)
Spoke with patient. Patient states that she was supposed to start her menses around the 16th and has not started. Has not taken UPT. Advised to take UPT. Is using rhythm method for contraception. Reports after she had intercourse she had a cramping pain on her right side that lasted for 1 hour. No pain since. No bleeding. Is having symptoms like she is going to start her menses. Asking if this is to be expected after having a miscarriage?

## 2017-06-17 NOTE — Telephone Encounter (Signed)
Often first few cycles after miscarriage are different but this should normalize.  Just encourage her to do a UPT as I would follow this very closely if positive since so close to miscarriage.

## 2017-06-17 NOTE — Telephone Encounter (Signed)
Patient sent the following correspondence through [facility]. Routing to triage to assist patient with request.  ----- Message from [name], Generic sent at 06/17/2017 1:25 PM EST -----    Good afternoon Dr. Sabra Heck,    According to my calendar I was supposed to start my period last week around the 16th. Is it normal to miss a period after a miscarriage? In addition, is it normal to have all the symptoms but no period? I also had that pain/cramping on my side after sex; I know we talked about it possibly being a cyst before, should I be concerned?   Thank you,    Denise [name]

## 2017-09-09 ENCOUNTER — Telehealth: Payer: Self-pay | Admitting: Obstetrics & Gynecology

## 2017-09-09 NOTE — Telephone Encounter (Signed)
Patient called requesting to speak with the nurse about hot flashes she is experiencing.

## 2017-09-09 NOTE — Telephone Encounter (Signed)
Spoke with patient. Reports hot flashes after miscarriage in 04/2017, thought was related to hormone changes, eventually resolved. Hot flashes returned couple of weeks ago, was advised to return for estrogen level if continued. LMP 3/30, describes length of menses varies, flow has returned to normal.   Denies any other gyn symptoms.   Recommended OV for further evaluation. OV scheduled for 4/17 at 3:45pm with Dr. Sabra Heck.   Routing to provider for final review. Patient is agreeable to disposition. Will close encounter.

## 2017-09-11 ENCOUNTER — Other Ambulatory Visit: Payer: Self-pay

## 2017-09-11 ENCOUNTER — Ambulatory Visit (INDEPENDENT_AMBULATORY_CARE_PROVIDER_SITE_OTHER): Payer: 59 | Admitting: Obstetrics & Gynecology

## 2017-09-11 ENCOUNTER — Encounter: Payer: Self-pay | Admitting: Obstetrics & Gynecology

## 2017-09-11 VITALS — BP 138/76 | HR 104 | Resp 18 | Ht 63.25 in | Wt 158.0 lb

## 2017-09-11 DIAGNOSIS — N926 Irregular menstruation, unspecified: Secondary | ICD-10-CM | POA: Diagnosis not present

## 2017-09-11 LAB — POCT URINE PREGNANCY: PREG TEST UR: NEGATIVE

## 2017-09-11 NOTE — Progress Notes (Signed)
GYNECOLOGY  VISIT  CC:   Irregular bleeding and hot flashes  HPI: 42 y.o. G2P1001 Single African American female here for irregular bleeding that has been an ongoing problem since delivery of her child.  She does not want to be on any hormonal method that could interfere with pregnancy.  She did have an unplanned pregnancy in the early winter that ended with a miscarriage.  This was much more difficult, emotionally, for her to handle than she expected.  So, is still interested in pregnancy.  Since the miscarriage, however, cycles have been more irregular.  Cycles have occurred the following days:  Jan 25-29, Feb 15-19, March 7-11, March 30-3.  Cycles are occurring between 14 and 19 days.  Flow has been five days each time she's cycled.  Flow was heavier than normal for her in January and February.  March was a little lighter and then the last cycle at the end of March was normal flow (like before the miscarriage).  Then starting April 5th, she started having hot flashes and still is having them.  Reports she is waking up with some night sweats as well.  This is causing some sleep disturbance.  Would like this evaluated.  Has gained 6 pounds since she was here in December.    GYNECOLOGIC HISTORY: Patient's last menstrual period was 08/24/2017. Contraception: withdraw  Menopausal hormone therapy: none   Patient Active Problem List   Diagnosis Date Noted  . Essential hypertension, benign 02/24/2016  . PIH (pregnancy induced hypertension) 08/29/2013  . Murmur 06/26/2013  . Palpitations 06/26/2013  . IBS (irritable bowel syndrome)   . Migraine   . GERD (gastroesophageal reflux disease)   . HSV-2 infection   . IC (interstitial cystitis)     Past Medical History:  Diagnosis Date  . GERD (gastroesophageal reflux disease)   . HSV-2 infection    2008  . IBS (irritable bowel syndrome)   . IC (interstitial cystitis)    2008  . Migraine   . Pregnancy induced hypertension     Past  Surgical History:  Procedure Laterality Date  . BREAST SURGERY  2002   right breast biopsy Benign  . CESAREAN SECTION N/A 08/30/2013   Procedure: CESAREAN SECTION;  Surgeon: Marvene Staff, MD;  Location: [facility];  Service: Obstetrics;  Laterality: N/A;  . KNEE ARTHROSCOPY  3/11, 2012   right knee  . SHOULDER ARTHROSCOPY    . TONSILLECTOMY    . WISDOM TOOTH EXTRACTION      MEDS:   Current Outpatient Medications on File Prior to Visit  Medication Sig Dispense Refill  . [employer] ALLERGY RELIEF D 60-120 MG 12 hr tablet Take 1 tablet by mouth 2 (two) times daily.  3  . cyclobenzaprine (FLEXERIL) 10 MG tablet Take 10 mg by mouth at bedtime as needed.  0  . fluticasone (FLONASE) 50 MCG/ACT nasal spray Place 2 sprays into both nostrils 2 (two) times daily as needed for allergies.     Marland Kitchen ibuprofen (ADVIL,MOTRIN) 800 MG tablet Take 1 tablet (800 mg total) by mouth every 8 (eight) hours as needed. 30 tablet 0  . lisdexamfetamine (VYVANSE) 40 MG capsule Take 40 mg by mouth every morning.    Renda Rolls AD 4-10-325 MG TABS Take 1 tablet by mouth as needed.   2  . olopatadine (PATANOL) 0.1 % ophthalmic solution INSTILL 1 DROP INTO BOTH EYES TWICE A DAY  3  . Prenatal Vit-Fe Fumarate-FA (PRENATAL MULTIVITAMIN) TABS tablet Take 1 tablet by  mouth daily at 12 noon.    . ranitidine (ZANTAC) 150 MG tablet Take 1 tablet (150 mg total) by mouth 2 (two) times daily. 60 tablet 2  . rizatriptan (MAXALT) 10 MG tablet Take 10 mg by mouth as needed.     Marland Kitchen telmisartan-hydrochlorothiazide (MICARDIS HCT) 40-12.5 MG tablet Take 1 tablet by mouth every morning.  1  . valACYclovir (VALTREX) 500 MG tablet Take 1 tablet (500 mg total) by mouth 2 (two) times daily. 180 tablet 4  . zonisamide (ZONEGRAN) 50 MG capsule      No current facility-administered medications on file prior to visit.     ALLERGIES: Ace inhibitors; Adhesive [tape]; and Latex  Family History  Problem Relation Age of Onset  . Diabetes Mother   .  Hyperlipidemia Mother   . Anemia Mother   . Diabetes Maternal Grandmother   . Heart failure Maternal Grandmother   . Diabetes Paternal Grandmother   . Stroke Paternal Grandmother   . Renal Disease Paternal Grandfather   . Heart disease Paternal Grandfather   . Stroke Paternal Grandfather   . Hyperlipidemia Father   . Hypertension Father   . Heart disease Father   . Heart disease Brother   . Prostate cancer Maternal Grandfather   . Hypertension Sister   . Hypertension Sister   . Thyroid disease Sister   . Anemia Maternal Aunt   . Prostate cancer Paternal Uncle     SH:  Single but in long term relationship, non smoker  Review of Systems  Constitutional: Positive for weight loss.  Cardiovascular: Positive for palpitations.  Gastrointestinal: Positive for nausea and vomiting.  Genitourinary:       Menstrual cycle changes Pain with intercourse  Skin: Positive for itching.  Neurological: Positive for headaches.  Endo/Heme/Allergies: Bruises/bleeds easily.  All other systems reviewed and are negative.   PHYSICAL EXAMINATION:    BP 138/76 (BP Location: Right Arm, Patient Position: Sitting, Cuff Size: Large)   Pulse (!) 104   Resp 18   Ht 5' 3.25" (1.607 m)   Wt 158 lb (71.7 kg)   LMP 08/24/2017   Breastfeeding? Unknown   BMI 27.77 kg/m     General appearance: alert, cooperative and appears stated age No other exam performed today  Assessment: H/O irregular cycles Hot flashes/night sweats Miscarriage in December Weight gain since miscarriage  Plan: TSH, HbA1c, CBC, AMH obtained today.  If AMH is decreaed, would recommend REI if pregnancy continues to be a desire.  Could consider estradiol patch as well.  If normal, will likely just continue to monitor.     ~15 minutes spent with patient >50% of time was in face to face discussion of above.

## 2017-09-14 LAB — CBC
Hematocrit: 38.8 % (ref 34.0–46.6)
Hemoglobin: 13.1 g/dL (ref 11.1–15.9)
MCH: 28.8 pg (ref 26.6–33.0)
MCHC: 33.8 g/dL (ref 31.5–35.7)
MCV: 85 fL (ref 79–97)
PLATELETS: 232 10*3/uL (ref 150–379)
RBC: 4.55 x10E6/uL (ref 3.77–5.28)
RDW: 14.1 % (ref 12.3–15.4)
WBC: 4.6 10*3/uL (ref 3.4–10.8)

## 2017-09-14 LAB — ANTI MULLERIAN HORMONE: ANTI-MULLERIAN HORMONE (AMH): 0.052 ng/mL

## 2017-09-14 LAB — HEMOGLOBIN A1C
ESTIMATED AVERAGE GLUCOSE: 103 mg/dL
HEMOGLOBIN A1C: 5.2 % (ref 4.8–5.6)

## 2017-09-14 LAB — TSH: TSH: 1.28 u[IU]/mL (ref 0.450–4.500)

## 2017-09-16 DIAGNOSIS — I10 Essential (primary) hypertension: Secondary | ICD-10-CM | POA: Diagnosis not present

## 2017-09-16 DIAGNOSIS — E785 Hyperlipidemia, unspecified: Secondary | ICD-10-CM | POA: Diagnosis not present

## 2017-09-18 DIAGNOSIS — I10 Essential (primary) hypertension: Secondary | ICD-10-CM | POA: Diagnosis not present

## 2017-09-18 DIAGNOSIS — E785 Hyperlipidemia, unspecified: Secondary | ICD-10-CM | POA: Diagnosis not present

## 2017-09-26 ENCOUNTER — Encounter: Payer: Self-pay | Admitting: Obstetrics & Gynecology

## 2017-09-26 ENCOUNTER — Telehealth: Payer: Self-pay | Admitting: Obstetrics & Gynecology

## 2017-09-26 NOTE — Telephone Encounter (Signed)
Spoke with patient. LMP 08/24/17. Has not taken UPT, feels like menses is going to start, but has not to date.  Denies any other symptoms, asking if this could be related to low ovarian reserves.   Patient previously scheduled for OV on 10/08/17 to discuss recent results.   Will review with Dr. Sabra Heck and return call  with recommendations.   Dr. Sabra Heck -please review and advise?

## 2017-09-26 NOTE — Telephone Encounter (Signed)
Patient sent the following correspondence through [place]. Routing to triage to assist patient with request.  ----- Message from [place], Generic sent at 09/26/2017 12:35 PM EDT -----    Good afternoon Dr. Sabra Heck,    Is it normal to miss a period because of having low ovarian reserves? According to the apps, I use to keep track of my cycle, I am either 9, 7, or 3 days late. Should I be concerned or is this a symptom of having low ovarian reserves?  Thank you,    Denise [name]

## 2017-09-26 NOTE — Telephone Encounter (Signed)
Most women with lower ovarian reserves do not ovulate regularly so cycles can be late or skipped entirely.  She is not using contraception so should do a UPT.  A lower ovarian reserve doesn't mean she can't get pregnant but it is just less common without contraception.

## 2017-09-30 NOTE — Telephone Encounter (Signed)
Spoke with patient. Advised as seen below per Dr. Sabra Heck. Patient took UPT on 09/26/17, negative. Menses has not started to date, will keep OV as scheduled for 5/14 and plan to further discuss at that time. Patient thankful for return call.   Routing to provider for final review. Patient is agreeable to disposition. Will close encounter.

## 2017-10-08 ENCOUNTER — Encounter: Payer: Self-pay | Admitting: Obstetrics & Gynecology

## 2017-10-08 ENCOUNTER — Other Ambulatory Visit: Payer: Self-pay

## 2017-10-08 ENCOUNTER — Ambulatory Visit (INDEPENDENT_AMBULATORY_CARE_PROVIDER_SITE_OTHER): Payer: 59 | Admitting: Obstetrics & Gynecology

## 2017-10-08 VITALS — BP 120/70 | HR 96 | Resp 16 | Ht 63.25 in | Wt 156.0 lb

## 2017-10-08 DIAGNOSIS — N979 Female infertility, unspecified: Secondary | ICD-10-CM

## 2017-10-08 NOTE — Progress Notes (Signed)
GYNECOLOGY  VISIT  CC:   Discuss lab results  HPI: 42 y.o. G33P1001 Single African American female here for lab results.  Pt had a miscarriage in December.  She does have a history of irregular cycles since the delivery of her first child.  Recent AMH was 0.052.  Results have been relayed to pt.  She does have additional questions.  AMH results reviewed with her today.    LMP:  08/24/17.  This was six weeks from the prior cycle.  On Saturday when cycle started, flow was heavier and darker in color.  Then lessened on Sunday.  She passed some small clots as well.  Yesterday there was moderate flow and this morning it was much lighter.  Bleeding is lighter in color today as well.  She did have some LLQ pain yesterday.   GYNECOLOGIC HISTORY: Patient's last menstrual period was 10/05/2017. Contraception: withdrawal Menopausal hormone therapy: none  Patient Active Problem List   Diagnosis Date Noted  . Essential hypertension, benign 02/24/2016  . PIH (pregnancy induced hypertension) 08/29/2013  . Murmur 06/26/2013  . Palpitations 06/26/2013  . IBS (irritable bowel syndrome)   . Migraine   . GERD (gastroesophageal reflux disease)   . HSV-2 infection   . IC (interstitial cystitis)     Past Medical History:  Diagnosis Date  . GERD (gastroesophageal reflux disease)   . HSV-2 infection    2008  . IBS (irritable bowel syndrome)   . IC (interstitial cystitis)    2008  . Migraine   . Pregnancy induced hypertension     Past Surgical History:  Procedure Laterality Date  . BREAST SURGERY  2002   right breast biopsy Benign  . CESAREAN SECTION N/A 08/30/2013   Procedure: CESAREAN SECTION;  Surgeon: Marvene Staff, MD;  Location: [facility];  Service: Obstetrics;  Laterality: N/A;  . KNEE ARTHROSCOPY  3/11, 2012   right knee  . SHOULDER ARTHROSCOPY    . TONSILLECTOMY    . WISDOM TOOTH EXTRACTION      MEDS:   Current Outpatient Medications on File Prior to Visit  Medication Sig  Dispense Refill  . [employer]S ALLERGY RELIEF D 60-120 MG 12 hr tablet Take 1 tablet by mouth 2 (two) times daily.  3  . cyclobenzaprine (FLEXERIL) 10 MG tablet Take 10 mg by mouth at bedtime as needed.  0  . fluticasone (FLONASE) 50 MCG/ACT nasal spray Place 2 sprays into both nostrils 2 (two) times daily as needed for allergies.     Marland Kitchen ibuprofen (ADVIL,MOTRIN) 800 MG tablet Take 1 tablet (800 mg total) by mouth every 8 (eight) hours as needed. 30 tablet 0  . lisdexamfetamine (VYVANSE) 40 MG capsule Take 40 mg by mouth every morning.    . montelukast (SINGULAIR) 10 MG tablet Take 1 tablet by mouth daily.  5  . NOREL AD 4-10-325 MG TABS Take 1 tablet by mouth as needed.   2  . olopatadine (PATANOL) 0.1 % ophthalmic solution INSTILL 1 DROP INTO BOTH EYES TWICE A DAY  3  . Prenatal Vit-Fe Fumarate-FA (PRENATAL MULTIVITAMIN) TABS tablet Take 1 tablet by mouth daily at 12 noon.    . ranitidine (ZANTAC) 150 MG tablet Take 1 tablet (150 mg total) by mouth 2 (two) times daily. 60 tablet 2  . rizatriptan (MAXALT) 10 MG tablet Take 10 mg by mouth as needed.     Marland Kitchen telmisartan-hydrochlorothiazide (MICARDIS HCT) 40-12.5 MG tablet Take 1 tablet by mouth every morning.  1  .  valACYclovir (VALTREX) 500 MG tablet Take 1 tablet (500 mg total) by mouth 2 (two) times daily. 180 tablet 4  . zonisamide (ZONEGRAN) 50 MG capsule      No current facility-administered medications on file prior to visit.     ALLERGIES: Ace inhibitors; Adhesive [tape]; and Latex  Family History  Problem Relation Age of Onset  . Diabetes Mother   . Hyperlipidemia Mother   . Anemia Mother   . Diabetes Maternal Grandmother   . Heart failure Maternal Grandmother   . Diabetes Paternal Grandmother   . Stroke Paternal Grandmother   . Renal Disease Paternal Grandfather   . Heart disease Paternal Grandfather   . Stroke Paternal Grandfather   . Hyperlipidemia Father   . Hypertension Father   . Heart disease Father   . Heart disease Brother    . Prostate cancer Maternal Grandfather   . Hypertension Sister   . Hypertension Sister   . Thyroid disease Sister   . Anemia Maternal Aunt   . Prostate cancer Paternal Uncle     SH:  Single but in long term relationship  Review of Systems  Genitourinary:       Menstrual cycle changes Unscheduled bleeding  Pain and bleeding with intercourse   Neurological: Positive for headaches.  All other systems reviewed and are negative.   PHYSICAL EXAMINATION:    BP 120/70 (BP Location: Right Arm, Patient Position: Sitting, Cuff Size: Normal)   Pulse 96   Resp 16   Ht 5' 3.25" (1.607 m)   Wt 156 lb (70.8 kg)   LMP 10/05/2017   BMI 27.42 kg/m     General appearance: alert, cooperative and appears stated age No exam performed today  Assessment: Decreased ovarian reserve Contemplating pregnancy  Plan: Feel pt really needs to decide if she wants to proceed with an attempt at pregnancy and what she and signficant other are ok with in regards to fertility treatment.  They are not on the same page at this time.  She will discuss with him and let me know if wants REI referral.  Continue PNV   ~25 minutes spent with patient >50% of time was in face to face discussion of above.

## 2017-10-14 ENCOUNTER — Other Ambulatory Visit: Payer: Self-pay | Admitting: Obstetrics & Gynecology

## 2017-10-15 NOTE — Telephone Encounter (Signed)
Medication refill request: Valtrex   Last [name]:  05-07-17  Next [name]: 05-12-18  Last MMG (if hormonal medication request): none on file Refill authorized: please advise

## 2017-12-18 DIAGNOSIS — E785 Hyperlipidemia, unspecified: Secondary | ICD-10-CM | POA: Diagnosis not present

## 2017-12-18 DIAGNOSIS — R10816 Epigastric abdominal tenderness: Secondary | ICD-10-CM | POA: Diagnosis not present

## 2017-12-18 DIAGNOSIS — J029 Acute pharyngitis, unspecified: Secondary | ICD-10-CM | POA: Diagnosis not present

## 2017-12-18 DIAGNOSIS — J398 Other specified diseases of upper respiratory tract: Secondary | ICD-10-CM | POA: Diagnosis not present

## 2017-12-18 DIAGNOSIS — I10 Essential (primary) hypertension: Secondary | ICD-10-CM | POA: Diagnosis not present

## 2017-12-20 ENCOUNTER — Encounter: Payer: Self-pay | Admitting: Obstetrics & Gynecology

## 2017-12-23 ENCOUNTER — Telehealth: Payer: Self-pay | Admitting: Obstetrics & Gynecology

## 2017-12-23 ENCOUNTER — Other Ambulatory Visit: Payer: Self-pay | Admitting: Obstetrics & Gynecology

## 2017-12-23 ENCOUNTER — Other Ambulatory Visit (INDEPENDENT_AMBULATORY_CARE_PROVIDER_SITE_OTHER): Payer: 59

## 2017-12-23 DIAGNOSIS — N912 Amenorrhea, unspecified: Secondary | ICD-10-CM

## 2017-12-23 DIAGNOSIS — R799 Abnormal finding of blood chemistry, unspecified: Secondary | ICD-10-CM | POA: Diagnosis not present

## 2017-12-23 NOTE — Telephone Encounter (Signed)
Reviewed with Dr. Sabra Heck.  Quant HCG in progress.  Will review and make plans based on results.  Encouraged patient to continue with amoxicillin for strep throat.  She will call back with any concerns.

## 2017-12-23 NOTE — Telephone Encounter (Signed)
-----   Message from [place], Generic sent at 12/23/2017 3:28 AM EDT -----    Good morning,    NP!! Thank you for getting back to me. I am taking Amoxicillin and I have not stopped taking it. However, I will not take it until I hear back from you. I will be calling the office when you all open to schedule an appointment. Yes, I am okay with you following my hormone levels. I want to make sure everything is okay too!  ----- Message -----  From: Megan Salon, MD  Sent: 12/23/2017 12:23 AM EDT  To: Denise [name]  Subject: RE: Non-Urgent Medical Question  I'm just seeing your message. I'm sorry it's so late. You may have just gone ahead and stopped it on your own. What antibiotic is it? There are many that are safe to take.    Home pregnancy test--that is good news. I'd like to follow your hormone levels if you are ok with that. Could you call the office and come in early this week for a blood pregnancy test?    Thanks.    Edwinna Areola    ----- Message -----   From: Denise [name]   Sent: 12/20/2017 6:51 PM EDT    To: Megan Salon, MD  Subject: Non-Urgent Medical Question    Good evening Dr. Sabra Heck,    My period is late, this is day 55 of my cycle. I took a pregnancy test a little while ago and it came back positive. I am currently on an antibiotic to treat strep throat, should I stop taking it?    Thank you,    Denise [name]   I called the patient this morning and scheduled her for a blood pregnancy test today at 11:30. Patient appreciative. Routing to triage to assist with order entry.

## 2017-12-23 NOTE — Telephone Encounter (Signed)
Patient is wondering should she stop taking her blood pressure medication or wait until she get results back from labs today?

## 2017-12-23 NOTE — Telephone Encounter (Signed)
Patient is scheduled for serum hcg quant lab appointment today.  Will close encounter.

## 2017-12-24 ENCOUNTER — Telehealth: Payer: Self-pay | Admitting: Obstetrics & Gynecology

## 2017-12-24 ENCOUNTER — Encounter: Payer: Self-pay | Admitting: Obstetrics & Gynecology

## 2017-12-24 DIAGNOSIS — IMO0002 Reserved for concepts with insufficient information to code with codable children: Secondary | ICD-10-CM

## 2017-12-24 DIAGNOSIS — R799 Abnormal finding of blood chemistry, unspecified: Secondary | ICD-10-CM

## 2017-12-24 LAB — BETA HCG QUANT (REF LAB): hCG Quant: 138 m[IU]/mL

## 2017-12-24 MED ORDER — METHYLDOPA 250 MG PO TABS: 250.0000 mg | ORAL_TABLET | Freq: Two times a day (BID) | ORAL | 1 refills | Status: DC

## 2017-12-24 NOTE — Telephone Encounter (Signed)
Yes, stat would be good for the HCG that is being done on Wednesday 12/25/17.  Thanks.

## 2017-12-24 NOTE — Telephone Encounter (Signed)
Message   ----- Message from [place], Generic sent at 12/24/2017 12:14 PM EDT -----    Good afternoon,    Im checking in to see if my test results are in? I was told yesterday that I would hear something in the morning but I have not heard anything yet.    Thank you,    [name]

## 2017-12-24 NOTE — Telephone Encounter (Signed)
-----   Message from Megan Salon, MD sent at 12/24/2017 12:01 PM EDT ----- Her level is 138.  This is good.  Should repeat in two days.  I would also like her to start Aldomet 250mg  bid for her hypertension and stop her current medication.  Ok to send in Rx.

## 2017-12-24 NOTE — Telephone Encounter (Signed)
Patient called to see if her lab results from yesterday are back yet.

## 2017-12-24 NOTE — Telephone Encounter (Signed)
Routing to Dr. Sabra Heck to review labs dated 7/30 and advise.

## 2017-12-24 NOTE — Telephone Encounter (Signed)
Results just sent to you with instructions about blood pressure medication as well.  She should get a BP cuff if she doesn't have it so she can start checking blood pressures as well.  Thanks.

## 2017-12-24 NOTE — Telephone Encounter (Signed)
Spoke with patient, advised as seen below per Dr. Sabra Heck. Lab order placed and scheduled for 8/1 at 11:30am. Rx for aldomet 250 mg bid #60/1RF to verified pharmacy. Patient verbalizes understanding and is agreeable.   Dr. Sabra Heck -I have ordered hcg as routine, please advise if needs to be STAT.   Routing to Dr. Sabra Heck

## 2017-12-25 ENCOUNTER — Telehealth: Payer: Self-pay | Admitting: *Deleted

## 2017-12-25 ENCOUNTER — Other Ambulatory Visit (INDEPENDENT_AMBULATORY_CARE_PROVIDER_SITE_OTHER): Payer: 59

## 2017-12-25 ENCOUNTER — Encounter: Payer: Self-pay | Admitting: Obstetrics & Gynecology

## 2017-12-25 DIAGNOSIS — R799 Abnormal finding of blood chemistry, unspecified: Secondary | ICD-10-CM | POA: Diagnosis not present

## 2017-12-25 DIAGNOSIS — IMO0002 Reserved for concepts with insufficient information to code with codable children: Secondary | ICD-10-CM

## 2017-12-25 LAB — BETA HCG QUANT (REF LAB): HCG QUANT: 132 m[IU]/mL

## 2017-12-25 NOTE — Telephone Encounter (Signed)
Ok to wait on Aldomet.

## 2017-12-25 NOTE — Telephone Encounter (Signed)
Call to patient. Left message to call back.  

## 2017-12-25 NOTE — Telephone Encounter (Signed)
Stat HCG results called to Dr Quincy Simmonds. (Dr Sabra Heck out of office).  To advise patient of results and provide bleeding and pain precautions. Will need office visit with Dr Sabra Heck tomorrow.

## 2017-12-25 NOTE — Telephone Encounter (Signed)
Spoke with patient. Lab appt rescheduled for today at 11:30am. New order placed for STAT Hcg.  Patient states Aldomet Rx was not available at pharmacy, had to be ordered, will be in at 5pm today. Patient states pharmacy did check with other pharmacies, med not in stock. Patient has not taken any BP meds today, asking if OK to wait on medication or provide alternative recommendation?   Routing to covering provider to review and advise.  Dr. Quincy Simmonds -please advise  Cc: Dr. Sabra Heck

## 2017-12-25 NOTE — Telephone Encounter (Signed)
Spoke with patient, advised as seen below per Dr. Silva.  Patient verbalizes understanding and is agreeable.  Encounter closed.  

## 2017-12-25 NOTE — Telephone Encounter (Signed)
Left detailed message, advised to return call to office to reschedule lab appt for today, 12/25/17. Return call to [place] at [facility], [phone].

## 2017-12-25 NOTE — Telephone Encounter (Signed)
Call from patient. Advised of results. Denies any bleeding or pain.  Reports pregnancy signs of urinary frequency, headaches and breast tenderness.Had mild cramping like menses prior to positive UPT. Advised to call and/or proceed to MAU if pain or bleeding present.  Office visit scheduled for tomorrow with Dr Sabra Heck at 115. Patient to call back for any change in status prior to appointment.  Will review with Dr Sabra Heck and call her back if any change to current plan.   Blood type O Positive ( Noted in Epic from 04-2017).

## 2017-12-26 ENCOUNTER — Encounter: Payer: Self-pay | Admitting: Obstetrics & Gynecology

## 2017-12-26 ENCOUNTER — Ambulatory Visit (INDEPENDENT_AMBULATORY_CARE_PROVIDER_SITE_OTHER): Payer: 59 | Admitting: Obstetrics & Gynecology

## 2017-12-26 ENCOUNTER — Telehealth: Payer: Self-pay | Admitting: Obstetrics & Gynecology

## 2017-12-26 ENCOUNTER — Other Ambulatory Visit: Payer: Self-pay

## 2017-12-26 VITALS — BP 114/72 | HR 84 | Resp 16 | Ht 63.25 in | Wt 158.8 lb

## 2017-12-26 DIAGNOSIS — R799 Abnormal finding of blood chemistry, unspecified: Secondary | ICD-10-CM | POA: Diagnosis not present

## 2017-12-26 DIAGNOSIS — I10 Essential (primary) hypertension: Secondary | ICD-10-CM

## 2017-12-26 DIAGNOSIS — O200 Threatened abortion: Secondary | ICD-10-CM | POA: Diagnosis not present

## 2017-12-26 DIAGNOSIS — IMO0002 Reserved for concepts with insufficient information to code with codable children: Secondary | ICD-10-CM

## 2017-12-26 NOTE — Telephone Encounter (Signed)
Patient sent the following message through [place]. Routing to triage to assist patient with request.  ----- Message from [place], Generic sent at 12/25/2017 8:00 PM EDT -----    Good evening,    The pharmacy still does not have the new blood pressure medicine Dr. Sabra Heck wanted me to switch to. I am being told it is on back order. Is there something else she would like me to take instead?

## 2017-12-26 NOTE — Progress Notes (Signed)
GYNECOLOGY  VISIT  CC:   Early pregnancy, abnormal HCG levels, hypertension  HPI: 42 y.o. [id number] Single African American female here for pregnancy consult.  Had positive pregnancy test a little less than two weeks ago.  Took a home UPT and this was positive.  HCG 12/23/17 was 138 and then again 12/25/17 (yesterday) was 132.  Pt had miscarriage last year.  This was emotionally very difficult for her.  She does want to be pregnancy.  AMH is low and so egg quality is likely diminished and therefore miscarriage risk is higher with pregnancy.  We have discussed REI consultation.  She has not decided to proceed with this.  SAB vs ectopic pregnancy discussed.  Will repeat HCG level on Friday.  If decreased significantly, will monitor.  If similar level or increased, will proceed with ultrasound.   MBT O+.  She has chronic hypertension.  Stopped BP medications at the beginning of the week.  Has been monitoring BP and it is lower, as is common in the first trimester.  Did prescribe Aldomet 250mg  bid but this was on back order.  Advised to wait one more day to see if change is needed.  If HCG is decreasing, will not change BP medication as SAB would be very likely.  Questions welcomed and answered.  Significant other did not ask very much.    GYNECOLOGIC HISTORY: Patient's last menstrual period was 11/18/2017. Contraception: none Menopausal hormone therapy: none  Patient Active Problem List   Diagnosis Date Noted  . Essential hypertension, benign 02/24/2016  . PIH (pregnancy induced hypertension) 08/29/2013  . Murmur 06/26/2013  . Palpitations 06/26/2013  . IBS (irritable bowel syndrome)   . Migraine   . GERD (gastroesophageal reflux disease)   . HSV-2 infection   . IC (interstitial cystitis)     Past Medical History:  Diagnosis Date  . GERD (gastroesophageal reflux disease)   . HSV-2 infection    2008  . IBS (irritable bowel syndrome)   . IC (interstitial cystitis)    2008  . Migraine   .  Pregnancy induced hypertension     Past Surgical History:  Procedure Laterality Date  . BREAST SURGERY  2002   right breast biopsy Benign  . CESAREAN SECTION N/A 08/30/2013   Procedure: CESAREAN SECTION;  Surgeon: Marvene Staff, MD;  Location: [facility];  Service: Obstetrics;  Laterality: N/A;  . KNEE ARTHROSCOPY  3/11, 2012   right knee  . SHOULDER ARTHROSCOPY    . TONSILLECTOMY    . WISDOM TOOTH EXTRACTION      MEDS:   Current Outpatient Medications on File Prior to Visit  Medication Sig Dispense Refill  . cyclobenzaprine (FLEXERIL) 10 MG tablet Take 10 mg by mouth at bedtime as needed.  0  . fluticasone (FLONASE) 50 MCG/ACT nasal spray Place 2 sprays into both nostrils 2 (two) times daily as needed for allergies.     Marland Kitchen ibuprofen (ADVIL,MOTRIN) 800 MG tablet Take 1 tablet (800 mg total) by mouth every 8 (eight) hours as needed. 30 tablet 0  . montelukast (SINGULAIR) 10 MG tablet Take 1 tablet by mouth daily.  5  . olopatadine (PATANOL) 0.1 % ophthalmic solution INSTILL 1 DROP INTO BOTH EYES TWICE A DAY  3  . Prenatal Vit-Fe Fumarate-FA (PRENATAL MULTIVITAMIN) TABS tablet Take 1 tablet by mouth daily at 12 noon.    . rizatriptan (MAXALT) 10 MG tablet Take 10 mg by mouth as needed.     . valACYclovir (VALTREX) 500  MG tablet TAKE 1 TABLET BY MOUTH TWO TIMES DAILY. 180 tablet 3  . lisdexamfetamine (VYVANSE) 40 MG capsule Take 40 mg by mouth every morning.    . methyldopa (ALDOMET) 250 MG tablet Take 1 tablet (250 mg total) by mouth 2 (two) times daily. (Patient not taking: Reported on 12/26/2017) 60 tablet 1   No current facility-administered medications on file prior to visit.     ALLERGIES: Ace inhibitors; Adhesive [tape]; and Latex  Family History  Problem Relation Age of Onset  . Diabetes Mother   . Hyperlipidemia Mother   . Anemia Mother   . Diabetes Maternal Grandmother   . Heart failure Maternal Grandmother   . Diabetes Paternal Grandmother   . Stroke Paternal  Grandmother   . Renal Disease Paternal Grandfather   . Heart disease Paternal Grandfather   . Stroke Paternal Grandfather   . Hyperlipidemia Father   . Hypertension Father   . Heart disease Father   . Heart disease Brother   . Prostate cancer Maternal Grandfather   . Hypertension Sister   . Hypertension Sister   . Thyroid disease Sister   . Anemia Maternal Aunt   . Prostate cancer Paternal Uncle     SH:  Single but in long term relationship, non smoker  Review of Systems  Cardiovascular: Positive for leg swelling.  Skin: Positive for itching.  All other systems reviewed and are negative.   PHYSICAL EXAMINATION:    BP 114/72 (BP Location: Right Arm, Patient Position: Sitting, Cuff Size: Large)   Pulse 84   Resp 16   Ht 5' 3.25" (1.607 m)   Wt 158 lb 12.8 oz (72 kg)   LMP 11/18/2017   BMI 27.91 kg/m     General appearance: alert, cooperative and appears stated age No other exam performed today.  Assessment: Abnormal HCG Hypertension H/O prior SAB in December Decreased AMH  Plan: Repeat HCG tomorrow.  If lower, will continue to monitor.  SAB precautions given.  Will restart BP medication tomorrow if HCG lower.  If high or minimal change, will plan to proceed with ultrasound.   ~25 minutes spent with patient >50% of time was in face to face discussion of above.

## 2017-12-26 NOTE — Telephone Encounter (Signed)
Routing to Dr. Sabra Heck to review and advise as patient has office visit today scheduled.

## 2017-12-27 ENCOUNTER — Other Ambulatory Visit (INDEPENDENT_AMBULATORY_CARE_PROVIDER_SITE_OTHER): Payer: Self-pay | Admitting: Obstetrics & Gynecology

## 2017-12-27 DIAGNOSIS — O200 Threatened abortion: Secondary | ICD-10-CM | POA: Diagnosis not present

## 2017-12-27 LAB — BETA HCG QUANT (REF LAB): hCG Quant: 97 m[IU]/mL

## 2017-12-27 NOTE — Progress Notes (Signed)
BP check for patient  118/68  Pulse: 92 Patient has been without BP medicine for 3 days.    Routed to Dr. Sabra Heck to close encounter.

## 2017-12-30 ENCOUNTER — Telehealth: Payer: Self-pay | Admitting: Obstetrics & Gynecology

## 2017-12-30 ENCOUNTER — Encounter: Payer: Self-pay | Admitting: Obstetrics & Gynecology

## 2017-12-30 DIAGNOSIS — O200 Threatened abortion: Secondary | ICD-10-CM

## 2017-12-30 DIAGNOSIS — R799 Abnormal finding of blood chemistry, unspecified: Secondary | ICD-10-CM

## 2017-12-30 DIAGNOSIS — IMO0002 Reserved for concepts with insufficient information to code with codable children: Secondary | ICD-10-CM

## 2017-12-30 NOTE — Telephone Encounter (Signed)
Patient sent the following correspondence through [place]. Routing to triage to assist patient with request.  ----- Message from [place], Generic sent at 12/30/2017 2:29 PM EDT -----    Good afternoon Dr. Sabra Heck,    I did receive your voicemail on Friday, I just needed to process the news. I started bleeding Saturday evening and I have been cramping since Friday. My cramps are not as bad today as they were yesterday or the day before.     Thank you,    Denise [name]   Last seen: 12/27/17

## 2017-12-30 NOTE — Telephone Encounter (Signed)
-----   Message from Megan Salon, MD sent at 12/27/2017  5:10 PM EDT ----- Called pt personally to inform her of HCG level decreasing.  Left detailed message and precautions given.  She will need a repeat HCG level weekly until back to normal.  Ok to schedule this at end of next week.

## 2017-12-30 NOTE — Telephone Encounter (Signed)
Reviewed with Dr. Sabra Heck. Call returned to patient. Lab appt scheduled for 01/02/18 at 11:30am. Precautions reviewed for heavy bleeding or severe pain. Patient verbalizes understanding.  Future lab order placed for hcg, routine.   Routing to provider for final review. Patient is agreeable to disposition. Will close encounter.

## 2018-01-02 ENCOUNTER — Other Ambulatory Visit (INDEPENDENT_AMBULATORY_CARE_PROVIDER_SITE_OTHER): Payer: 59

## 2018-01-02 DIAGNOSIS — O200 Threatened abortion: Secondary | ICD-10-CM | POA: Diagnosis not present

## 2018-01-02 DIAGNOSIS — R799 Abnormal finding of blood chemistry, unspecified: Secondary | ICD-10-CM

## 2018-01-02 DIAGNOSIS — IMO0002 Reserved for concepts with insufficient information to code with codable children: Secondary | ICD-10-CM

## 2018-01-03 LAB — BETA HCG QUANT (REF LAB): hCG Quant: 3 m[IU]/mL

## 2018-01-16 DIAGNOSIS — E785 Hyperlipidemia, unspecified: Secondary | ICD-10-CM | POA: Diagnosis not present

## 2018-01-16 DIAGNOSIS — I10 Essential (primary) hypertension: Secondary | ICD-10-CM | POA: Diagnosis not present

## 2018-01-24 DIAGNOSIS — I10 Essential (primary) hypertension: Secondary | ICD-10-CM | POA: Diagnosis not present

## 2018-01-24 DIAGNOSIS — Z6828 Body mass index (BMI) 28.0-28.9, adult: Secondary | ICD-10-CM | POA: Diagnosis not present

## 2018-02-08 DIAGNOSIS — Z6828 Body mass index (BMI) 28.0-28.9, adult: Secondary | ICD-10-CM | POA: Diagnosis not present

## 2018-02-08 DIAGNOSIS — I10 Essential (primary) hypertension: Secondary | ICD-10-CM | POA: Diagnosis not present

## 2018-02-14 DIAGNOSIS — H00034 Abscess of left upper eyelid: Secondary | ICD-10-CM | POA: Diagnosis not present

## 2018-04-10 ENCOUNTER — Encounter: Payer: Self-pay | Admitting: Allergy

## 2018-04-10 ENCOUNTER — Ambulatory Visit (INDEPENDENT_AMBULATORY_CARE_PROVIDER_SITE_OTHER): Payer: 59 | Admitting: Allergy

## 2018-04-10 VITALS — BP 112/80 | HR 80 | Temp 98.4°F | Resp 18 | Ht 63.5 in | Wt 162.2 lb

## 2018-04-10 DIAGNOSIS — L503 Dermatographic urticaria: Secondary | ICD-10-CM

## 2018-04-10 DIAGNOSIS — L508 Other urticaria: Secondary | ICD-10-CM | POA: Diagnosis not present

## 2018-04-10 DIAGNOSIS — J3089 Other allergic rhinitis: Secondary | ICD-10-CM

## 2018-04-10 MED ORDER — CETIRIZINE HCL 10 MG PO TABS: 10.0000 mg | ORAL_TABLET | Freq: Every day | ORAL | 5 refills | Status: DC

## 2018-04-10 NOTE — Progress Notes (Signed)
New Patient Note  RE: Denise [name] MRN: [id number] DOB: 01-09-1976 Date of Office Visit: 04/10/2018  Referring provider: Lucianne Lei, MD Primary care provider: Lucianne Lei, MD  Chief Complaint: Urticaria and Angioedema  History of Present Illness: I had the pleasure of seeing Denise [name] for initial evaluation at the [facility] on 04/10/2018. She is a 42 y.o. female, who is referred here by Lucianne Lei, MD for the evaluation of urticaria.  Rash/welts started about 10 months ago. Mainly occurs on her back, chest, arm, neck and inner thighs. Describes them as pruritic, raised and red at times. Individual rashes lasts about a few hours. This usually happens 1-2 times per month especially during her menses. Last episode was yesterday. No ecchymosis upon resolution. Associated symptoms include: none. Patient had a miscarriage in December 2018. Suspected triggers are unknown. Denies any fevers, chills, changes in medications, foods, personal care products or recent infections. She has tried the following therapies: benadryl with some benefit. Systemic steroids none. Currently on no regular antihistamines.  Previous work up includes: none. Previous history of rash/hives: yes occasionally this would occur with no known triggers.  Patient is up to date with the following cancer screening tests: pap smears and mammogram.  Assessment and Plan: Denise [name] is a 42 y.o. female with: Chronic urticaria Pruritic hives/welts on and off for the past 10 months. Seemed to be worse since she had miscarriage and seem to occur more frequently during her menses. Tried benadryl prn with good benefit. Denies any changes in diet, medications, personal care products or infections. Positive dermatographism on exam.  Based on clinical history, she likely has chronic idiopathic urticaria. Discussed with patient, that urticaria is usually caused by release of histamine by cutaneous mast cells but  sometimes it is non-histamine mediated. Explained that urticaria is not always associated with allergies, and may be related to other infectious or autoimmune causes. In most cases, the exact etiology for urticaria can not be established and it is considered idiopathic. Meanwhile start the following medications: zyrtec 10mg  once a day and may increase to twice a day during flares. Monitor symptoms.  Avoid the following potential triggers: alcohol, tight clothing, NSAIDs.  Get bloodwork as below.  Other allergic rhinitis Rhinitis symptoms for the past 5 years mainly during the spring and summer.  Using Singulair and Allegra as needed with good benefit.  Today skin testing was positive to dust mites and cats.  Discussed environmental control measures.  The Zyrtec 10mg  daily prescribed for the hive should also help with the allergic rhinitis symptoms.   Return in about 2 months (around 06/10/2018).  Meds ordered this encounter  Medications  . cetirizine (ZYRTEC ALLERGY) 10 MG tablet    Sig: Take 1 tablet (10 mg total) by mouth daily.    Dispense:  30 tablet    Refill:  5    Lab Orders     Thyroid Cascade Profile     Comprehensive metabolic panel     CBC with Differential/Platelet     ANA w/Reflex   Other allergy screening: Asthma: no Rhino conjunctivitis: yes  She reports symptoms of PND, rhinorrhea, nasal congestion, sneezing, itchy/watery eyes. Symptoms have been going on for 5 years. The symptoms are present during the spring and summer. She has used Singulair prn, allegra with some improvement in symptoms. Sinus infections: yes. Previous work up includes: none.  Food allergy: no Medication allergy: yes  Ace inhibitors - lip swelling Latex - rash/pruritus Hymenoptera allergy:  no Urticaria: yes Eczema:no History of recurrent infections suggestive of immunodeficency: no  Diagnostics: Skin Testing: Environmental allergy panel. Positive test to: dust mites and cats.  Results  discussed with patient/family. Airborne Adult Perc - 04/10/18 1426    Time Antigen Placed  1426    Allergen Manufacturer  Lavella Hammock    Location  Back    Number of Test  59    Panel 1  Select    1. Control-Buffer 50% Glycerol  Negative    2. Control-Histamine 1 mg/ml  2+    3. Albumin saline  Negative    4. [place]  Negative    5. [place]  Negative    6. [place]  Negative    7. [place] Blue  Negative    8. Meadow Fescue  Negative    9. Perennial Rye  Negative    10. Sweet Vernal  Negative    11. Timothy  Negative    12. Cocklebur  Negative    13. Burweed Marshelder  Negative    14. Ragweed, short  Negative    15. Ragweed, Giant  Negative    16. Plantain,  English  Negative    17. Lamb's Quarters  Negative    18. Sheep Sorrell  Negative    19. Rough Pigweed  Negative    20. Marsh Elder, Rough  Negative    21. Mugwort, Common  Negative    22. Ash mix  Negative    23. Birch mix  Negative    24. Beech American  Negative    25. Box, Elder  Negative    26. Cedar, red  Negative    27. Cottonwood, [place]  Negative    28. Elm mix  Negative    29. Hickory mix  Negative    30. Maple mix  Negative    31. Oak, [place] mix  Negative    32. Pecan Pollen  Negative    33. Pine mix  Negative    34. Sycamore Eastern  Negative    35. Sanford, Black Pollen  Negative    36. Alternaria alternata  Negative    37. Cladosporium Herbarum  Negative    38. Aspergillus mix  Negative    39. Penicillium mix  Negative    40. Bipolaris sorokiniana (Helminthosporium)  Negative    41. Drechslera spicifera (Curvularia)  Negative    42. Mucor plumbeus  Negative    43. Fusarium moniliforme  Negative    44. Aureobasidium pullulans (pullulara)  Negative    45. Rhizopus oryzae  Negative    46. Botrytis cinera  Negative    47. Epicoccum nigrum  Negative    48. Phoma betae  Negative    49. Candida Albicans  Negative    50. Trichophyton mentagrophytes  Negative    51. Mite, D Farinae  5,000 AU/ml  3+    52.  Mite, D Pteronyssinus  5,000 AU/ml  3+    53. Cat Hair 10,000 BAU/ml  4+    54.  Dog Epithelia  Negative    55. Mixed Feathers  Negative    56. Horse Epithelia  Negative    57. Cockroach, German  Negative    58. Mouse  Negative    59. Tobacco Leaf  Negative       Past Medical History: Patient Active Problem List   Diagnosis Date Noted  . Other allergic rhinitis 04/10/2018  . Chronic urticaria 04/10/2018  . 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 Essential hypertension, benign 02/24/2016  .  PIH (pregnancy induced hypertension) 08/29/2013  . Murmur 06/26/2013  . Palpitations 06/26/2013  . IBS (irritable bowel syndrome)   . Migraine   . GERD (gastroesophageal reflux disease)   . HSV-2 infection   . IC (interstitial cystitis)    Past Medical History:  Diagnosis Date  . Angio-edema   . GERD (gastroesophageal reflux disease)   . HSV-2 infection    2008  . IBS (irritable bowel syndrome)   . IC (interstitial cystitis)    2008  . Migraine   . Pregnancy induced hypertension   . Urticaria    Past Surgical History: Past Surgical History:  Procedure Laterality Date  . ADENOIDECTOMY    . BREAST SURGERY  2002   right breast biopsy Benign  . CESAREAN SECTION N/A 08/30/2013   Procedure: CESAREAN SECTION;  Surgeon: Marvene Staff, MD;  Location: [facility];  Service: Obstetrics;  Laterality: N/A;  . KNEE ARTHROSCOPY  3/11, 2012   right knee  . SHOULDER ARTHROSCOPY    . TONSILLECTOMY    . WISDOM TOOTH EXTRACTION     Medication List:  Current Outpatient Medications  Medication Sig Dispense Refill  . cyclobenzaprine (FLEXERIL) 10 MG tablet Take 10 mg by mouth at bedtime as needed.  0  . fluticasone (FLONASE) 50 MCG/ACT nasal spray Place 2 sprays into both nostrils 2 (two) times daily as needed for allergies.     Marland Kitchen ibuprofen (ADVIL,MOTRIN) 800 MG tablet Take 1 tablet (800 mg total) by mouth every 8 (eight) hours as needed. 30 tablet 0  . indapamide (LOZOL) 1.25 MG tablet Take 1.25 mg by  mouth daily.    Marland Kitchen lisdexamfetamine (VYVANSE) 40 MG capsule Take 40 mg by mouth every morning.    . methyldopa (ALDOMET) 250 MG tablet Take 1 tablet (250 mg total) by mouth 2 (two) times daily. 60 tablet 1  . montelukast (SINGULAIR) 10 MG tablet Take 1 tablet by mouth daily.  5  . olopatadine (PATANOL) 0.1 % ophthalmic solution INSTILL 1 DROP INTO BOTH EYES TWICE A DAY  3  . Prenatal Vit-Fe Fumarate-FA (PRENATAL MULTIVITAMIN) TABS tablet Take 1 tablet by mouth daily at 12 noon.    . rizatriptan (MAXALT) 10 MG tablet Take 10 mg by mouth as needed.     . valACYclovir (VALTREX) 500 MG tablet TAKE 1 TABLET BY MOUTH TWO TIMES DAILY. 180 tablet 3  . cetirizine (ZYRTEC ALLERGY) 10 MG tablet Take 1 tablet (10 mg total) by mouth daily. 30 tablet 5   No current facility-administered medications for this visit.    Allergies: Allergies  Allergen Reactions  . Ace Inhibitors   . Adhesive [Tape] Rash  . Latex Itching and Rash    Rash and itching from use of gloves   Social History: Social History   Socioeconomic History  . Marital status: Single    Spouse name: Not on file  . Number of children: Not on file  . Years of education: Not on file  . Highest education level: Not on file  Occupational History  . Not on file  Social Needs  . Financial resource strain: Not on file  . Food insecurity:    Worry: Not on file    Inability: Not on file  . Transportation needs:    Medical: Not on file    Non-medical: Not on file  Tobacco Use  . Smoking status: Never Smoker  . Smokeless tobacco: Never Used  Substance and Sexual Activity  . Alcohol use: Yes  Comment: socially, not while pregnant  . Drug use: No    Comment: last use 15 years ago  . Sexual activity: Yes    Partners: Male    Birth control/protection: Coitus interruptus  Lifestyle  . Physical activity:    Days per week: Not on file    Minutes per session: Not on file  . Stress: Not on file  Relationships  . Social connections:      Talks on phone: Not on file    Gets together: Not on file    Attends religious service: Not on file    Active member of club or organization: Not on file    Attends meetings of clubs or organizations: Not on file    Relationship status: Not on file  Other Topics Concern  . Not on file  Social History Narrative  . Not on file   Lives in a [address] year old townhouse. 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 Diabetes Mother   . Hyperlipidemia Mother   . Anemia Mother   . Diabetes Maternal Grandmother   . Heart failure Maternal Grandmother   . Diabetes Paternal Grandmother   . Stroke Paternal Grandmother   . Renal Disease Paternal Grandfather   . Heart disease Paternal Grandfather   . Stroke Paternal Grandfather   . Hyperlipidemia Father   . Hypertension Father   . Heart disease Father   . Heart disease Brother   . Prostate cancer Maternal Grandfather   . Hypertension Sister   . Hypertension Sister   . Thyroid disease Sister   . Anemia Maternal Aunt   . Prostate cancer Paternal Uncle    Problem                               Relation Asthma                                   Son  Eczema                                Son  Food allergy                          No  Allergic rhino conjunctivitis     Sisters   Review of Systems  Constitutional: Negative for appetite change, chills, fever and unexpected weight change.  HENT: Negative for congestion and rhinorrhea.   Eyes: Negative for itching.  Respiratory: Negative for cough, chest tightness, shortness of breath and wheezing.   Cardiovascular: Negative for chest pain.  Gastrointestinal: Negative for abdominal pain.  Genitourinary: Negative for difficulty urinating.  Skin: Positive for rash.   Allergic/Immunologic: Negative for environmental allergies and food allergies.  Neurological: Negative for headaches.   Objective: BP 112/80 (BP Location: Left Arm, Patient Position: Sitting, Cuff Size: Large)   Pulse 80   Temp 98.4 F (36.9 C) (Oral)   Resp 18   Ht 5' 3.5" (1.613 m)   Wt 162 lb 3.2 oz (73.6 kg)   LMP 11/18/2017   SpO2 97%   BMI  28.28 kg/m  Body mass index is 28.28 kg/m. Physical Exam  Constitutional: She is oriented to person, place, and time. She appears well-developed and well-nourished.  HENT:  Head: Normocephalic and atraumatic.  Right Ear: External ear normal.  Left Ear: External ear normal.  Nose: Nose normal.  Mouth/Throat: Oropharynx is clear and moist.  Eyes: Conjunctivae and EOM are normal.  Neck: Neck supple.  Cardiovascular: Normal rate, regular rhythm and normal heart sounds. Exam reveals no gallop and no friction rub.  No murmur heard. Pulmonary/Chest: Effort normal and breath sounds normal. She has no wheezes. She has no rales.  Abdominal: Soft.  Lymphadenopathy:    She has no cervical adenopathy.  Neurological: She is alert and oriented to person, place, and time.  Skin: Skin is warm. Rash noted.  +2 dermatographism  Psychiatric: She has a normal mood and affect. Her behavior is normal.  Nursing note and vitals reviewed.  The plan was reviewed with the patient/family, and all questions/concerned were addressed.  It was my pleasure to see Denise [name] today and participate in her care. Please feel free to contact me with any questions or concerns.  Sincerely,  Rexene Alberts, DO Allergy & Immunology  [facility] office: [phone] [place]

## 2018-04-10 NOTE — Assessment & Plan Note (Addendum)
Pruritic hives/welts on and off for the past 10 months. Seemed to be worse since Denise [name] had miscarriage and seem to occur more frequently during her menses. Tried benadryl prn with good benefit. Denies any changes in diet, medications, personal care products or infections. Positive dermatographism on exam.  Based on clinical history, Denise [name] likely has chronic idiopathic urticaria. Discussed with Denise [name], that urticaria is usually caused by release of histamine by cutaneous mast cells but sometimes it is non-histamine mediated. Explained that urticaria is not always associated with allergies, and may be related to other infectious or autoimmune causes. In most cases, the exact etiology for urticaria can not be established and it is considered idiopathic. Meanwhile start the following medications: zyrtec 10mg  once a day and may increase to twice a day during flares. Monitor symptoms.  Avoid the following potential triggers: alcohol, tight clothing, NSAIDs.  Get bloodwork as below.

## 2018-04-10 NOTE — Assessment & Plan Note (Signed)
Rhinitis symptoms for the past 5 years mainly during the spring and summer.  Using Singulair and Allegra as needed with good benefit.  Today skin testing was positive to dust mites and cats.  Discussed environmental control measures.  The Zyrtec 10mg  daily prescribed for the hive should also help with the allergic rhinitis symptoms.

## 2018-04-10 NOTE — Patient Instructions (Addendum)
Today's testing showed: positive to dust mites and cats Discussed environmental control measures.   Get bloodwork  Hives: Start zyrtec 10mg  daily and monitor symptoms. During flares may increase to 10mg  twice a day.  Avoid the following triggers: NSAIDs, alcohol and tight clothing  Follow up in 2 months  Control of House Dust Mite Allergen . Dust mite allergens are a common trigger of allergy and asthma symptoms. While they can be found throughout the house, these microscopic creatures thrive in warm, humid environments such as bedding, upholstered furniture and carpeting. . Because so much time is spent in the bedroom, it is essential to reduce mite levels there.  . Encase pillows, mattresses, and box springs in special allergen-proof fabric covers or airtight, zippered plastic covers.  . Bedding should be washed weekly in hot water (130 F) and dried in a hot dryer. Allergen-proof covers are available for comforters and pillows that can't be regularly washed.  Wendee Copp the allergy-proof covers every few months. Minimize clutter in the bedroom. Keep pets out of the bedroom.  Marland Kitchen Keep humidity less than 50% by using a dehumidifier or air conditioning. You can buy a humidity measuring device called a hygrometer to monitor this.  . If possible, replace carpets with hardwood, linoleum, or washable area rugs. If that's not possible, vacuum frequently with a vacuum that has a HEPA filter. . Remove all upholstered furniture and non-washable window drapes from the bedroom. . Remove all non-washable stuffed toys from the bedroom.  Wash stuffed toys weekly. Pet Allergen Avoidance: . Contrary to popular opinion, there are no "hypoallergenic" breeds of dogs or cats. That is because people are not allergic to an animal's hair, but to an allergen found in the animal's saliva, dander (dead skin flakes) or urine. Pet allergy symptoms typically occur within minutes. For some people, symptoms can build up and  become most severe 8 to 12 hours after contact with the animal. People with severe allergies can experience reactions in public places if dander has been transported on the pet owners' clothing. Marland Kitchen Keeping an animal outdoors is only a partial solution, since homes with pets in the yard still have higher concentrations of animal allergens. . Before getting a pet, ask your allergist to determine if you are allergic to animals. If your pet is already considered part of your family, try to minimize contact and keep the pet out of the bedroom and other rooms where you spend a great deal of time. . As with dust mites, vacuum carpets often or replace carpet with a hardwood floor, tile or linoleum. . High-efficiency particulate air (HEPA) cleaners can reduce allergen levels over time. . While dander and saliva are the source of cat and dog allergens, urine is the source of allergens from rabbits, hamsters, mice and [place] pigs; so ask a non-allergic family member to clean the animal's cage. . If you have a pet allergy, talk to your allergist about the potential for allergy immunotherapy (allergy shots). This strategy can often provide long-term relief.

## 2018-04-11 ENCOUNTER — Encounter: Payer: Self-pay | Admitting: Allergy

## 2018-04-11 LAB — COMPREHENSIVE METABOLIC PANEL
ALK PHOS: 71 IU/L (ref 39–117)
ALT: 11 IU/L (ref 0–32)
AST: 19 IU/L (ref 0–40)
Albumin/Globulin Ratio: 1.9 (ref 1.2–2.2)
Albumin: 4.5 g/dL (ref 3.5–5.5)
BILIRUBIN TOTAL: 0.9 mg/dL (ref 0.0–1.2)
BUN / CREAT RATIO: 14 (ref 9–23)
BUN: 13 mg/dL (ref 6–24)
CHLORIDE: 98 mmol/L (ref 96–106)
CO2: 28 mmol/L (ref 20–29)
Calcium: 9.7 mg/dL (ref 8.7–10.2)
Creatinine, Ser: 0.92 mg/dL (ref 0.57–1.00)
GFR calc Af Amer: 90 mL/min/{1.73_m2} (ref >59)
GFR calc non Af Amer: 78 mL/min/{1.73_m2} (ref >59)
GLUCOSE: 91 mg/dL (ref 65–99)
Globulin, Total: 2.4 g/dL (ref 1.5–4.5)
Potassium: 3.8 mmol/L (ref 3.5–5.2)
Sodium: 143 mmol/L (ref 134–144)
Total Protein: 6.9 g/dL (ref 6.0–8.5)

## 2018-04-11 LAB — CBC WITH DIFFERENTIAL/PLATELET
BASOS ABS: 0 10*3/uL (ref 0.0–0.2)
Basos: 1 %
EOS (ABSOLUTE): 0 10*3/uL (ref 0.0–0.4)
Eos: 1 %
Hematocrit: 39.6 % (ref 34.0–46.6)
Hemoglobin: 13.4 g/dL (ref 11.1–15.9)
Immature Grans (Abs): 0 10*3/uL (ref 0.0–0.1)
Immature Granulocytes: 0 %
LYMPHS ABS: 1.9 10*3/uL (ref 0.7–3.1)
Lymphs: 43 %
MCH: 29.1 pg (ref 26.6–33.0)
MCHC: 33.8 g/dL (ref 31.5–35.7)
MCV: 86 fL (ref 79–97)
MONOS ABS: 0.5 10*3/uL (ref 0.1–0.9)
Monocytes: 11 %
Neutrophils Absolute: 1.9 10*3/uL (ref 1.4–7.0)
Neutrophils: 44 %
Platelets: 257 10*3/uL (ref 150–450)
RBC: 4.61 x10E6/uL (ref 3.77–5.28)
RDW: 13.1 % (ref 12.3–15.4)
WBC: 4.2 10*3/uL (ref 3.4–10.8)

## 2018-04-11 LAB — THYROID CASCADE PROFILE: TSH: 1.6 u[IU]/mL (ref 0.450–4.500)

## 2018-04-11 LAB — ANA W/REFLEX: ANA: NEGATIVE

## 2018-04-12 DIAGNOSIS — I10 Essential (primary) hypertension: Secondary | ICD-10-CM | POA: Diagnosis not present

## 2018-04-30 DIAGNOSIS — Z6829 Body mass index (BMI) 29.0-29.9, adult: Secondary | ICD-10-CM | POA: Diagnosis not present

## 2018-04-30 DIAGNOSIS — I10 Essential (primary) hypertension: Secondary | ICD-10-CM | POA: Diagnosis not present

## 2018-04-30 DIAGNOSIS — E785 Hyperlipidemia, unspecified: Secondary | ICD-10-CM | POA: Diagnosis not present

## 2018-04-30 DIAGNOSIS — J0100 Acute maxillary sinusitis, unspecified: Secondary | ICD-10-CM | POA: Diagnosis not present

## 2018-05-12 ENCOUNTER — Ambulatory Visit: Payer: 59 | Admitting: Obstetrics & Gynecology

## 2018-05-12 NOTE — Progress Notes (Deleted)
42 y.o. G49P1011 Single Black or [place] American female here for annual exam.    Patient's last menstrual period was 11/18/2017.          Sexually active: {yes no:314532}  The current method of family planning is {contraception:315051}.    Exercising: {yes no:314532}  {types:19826} Smoker:  {YES P5382123  Health Maintenance: Pap:  01/26/15 Neg. HR HPV:neg  History of abnormal Pap:  no MMG:  05/17/15 [place] Right BIRADS2:Benign. F/u screening 3 years  Colonoscopy:  *** BMD:   *** TDaP:  2015 Screening Labs: ***, Hb today: ***, Urine today: ***   reports that she has never smoked. She has never used smokeless tobacco. She reports current alcohol use. She reports that she does not use drugs.  Past Medical History:  Diagnosis Date  . Angio-edema   . GERD (gastroesophageal reflux disease)   . HSV-2 infection    2008  . IBS (irritable bowel syndrome)   . IC (interstitial cystitis)    2008  . Migraine   . Pregnancy induced hypertension   . Urticaria     Past Surgical History:  Procedure Laterality Date  . ADENOIDECTOMY    . BREAST SURGERY  2002   right breast biopsy Benign  . CESAREAN SECTION N/A 08/30/2013   Procedure: CESAREAN SECTION;  Surgeon: Marvene Staff, MD;  Location: [facility];  Service: Obstetrics;  Laterality: N/A;  . KNEE ARTHROSCOPY  3/11, 2012   right knee  . SHOULDER ARTHROSCOPY    . TONSILLECTOMY    . WISDOM TOOTH EXTRACTION      Current Outpatient Medications  Medication Sig Dispense Refill  . cetirizine (ZYRTEC ALLERGY) 10 MG tablet Take 1 tablet (10 mg total) by mouth daily. 30 tablet 5  . cyclobenzaprine (FLEXERIL) 10 MG tablet Take 10 mg by mouth at bedtime as needed.  0  . fluticasone (FLONASE) 50 MCG/ACT nasal spray Place 2 sprays into both nostrils 2 (two) times daily as needed for allergies.     Marland Kitchen ibuprofen (ADVIL,MOTRIN) 800 MG tablet Take 1 tablet (800 mg total) by mouth every 8 (eight) hours as needed. 30 tablet 0  . indapamide (LOZOL) 1.25 MG  tablet Take 1.25 mg by mouth daily.    Marland Kitchen lisdexamfetamine (VYVANSE) 40 MG capsule Take 40 mg by mouth every morning.    . methyldopa (ALDOMET) 250 MG tablet Take 1 tablet (250 mg total) by mouth 2 (two) times daily. 60 tablet 1  . montelukast (SINGULAIR) 10 MG tablet Take 1 tablet by mouth daily.  5  . olopatadine (PATANOL) 0.1 % ophthalmic solution INSTILL 1 DROP INTO BOTH EYES TWICE A DAY  3  . Prenatal Vit-Fe Fumarate-FA (PRENATAL MULTIVITAMIN) TABS tablet Take 1 tablet by mouth daily at 12 noon.    . rizatriptan (MAXALT) 10 MG tablet Take 10 mg by mouth as needed.     . valACYclovir (VALTREX) 500 MG tablet TAKE 1 TABLET BY MOUTH TWO TIMES DAILY. 180 tablet 3   No current facility-administered medications for this visit.     Family History  Problem Relation Age of Onset  . Diabetes Mother   . Hyperlipidemia Mother   . Anemia Mother   . Diabetes Maternal Grandmother   . Heart failure Maternal Grandmother   . Diabetes Paternal Grandmother   . Stroke Paternal Grandmother   . Renal Disease Paternal Grandfather   . Heart disease Paternal Grandfather   . Stroke Paternal Grandfather   . Hyperlipidemia Father   . Hypertension Father   .  Heart disease Father   . Heart disease Brother   . Prostate cancer Maternal Grandfather   . Hypertension Sister   . Hypertension Sister   . Thyroid disease Sister   . Anemia Maternal Aunt   . Prostate cancer Paternal Uncle     Review of Systems  Exam:   LMP 11/18/2017   Height:      Ht Readings from Last 3 Encounters:  04/10/18 5' 3.5" (1.613 m)  12/26/17 5' 3.25" (1.607 m)  10/08/17 5' 3.25" (1.607 m)    General appearance: alert, cooperative and appears stated age Head: Normocephalic, without obvious abnormality, atraumatic Neck: no adenopathy, supple, symmetrical, trachea midline and thyroid {EXAM; THYROID:18604} Lungs: clear to auscultation bilaterally Breasts: {Exam; breast:13139::"normal appearance, no masses or tenderness"} Heart:  regular rate and rhythm Abdomen: soft, non-tender; bowel sounds normal; no masses,  no organomegaly Extremities: extremities normal, atraumatic, no cyanosis or edema Skin: Skin color, texture, turgor normal. No rashes or lesions Lymph nodes: Cervical, supraclavicular, and axillary nodes normal. No abnormal inguinal nodes palpated Neurologic: Grossly normal   Pelvic: External genitalia:  no lesions              Urethra:  normal appearing urethra with no masses, tenderness or lesions              Bartholins and Skenes: normal                 Vagina: normal appearing vagina with normal color and discharge, no lesions              Cervix: {exam; cervix:14595}              Pap taken: {yes no:314532} Bimanual Exam:  Uterus:  {exam; uterus:12215}              Adnexa: {exam; adnexa:12223}               Rectovaginal: Confirms               Anus:  normal sphincter tone, no lesions  Chaperone was present for exam.  A:  Well Woman with normal exam  P:   {plan; gyn:5269::"mammogram","pap smear","return annually or prn"}

## 2018-05-13 NOTE — Progress Notes (Signed)
42 y.o. G47P1011 Single Black or [place] American female here for annual exam.  Planning wedding for 2020.  Parents just got here for the holidays last night.  Cycles are between four to six weeks apart.  Flow typically lasts between 5-7 days.  Typically has heavier flow on days 2 and 3.  Not taking a vitamin.  Not using anything for contraception.    Has gotten a flu shot.  Desires vaginitis testing today.  PCP:  Dr. Criss Rosales.  Did blood work this year.    Patient's last menstrual period was 05/08/2018.          Sexually active: Yes.    The current method of family planning is rhythm method.    Exercising: No.  The patient does not participate in regular exercise at present. Smoker:  no  Health Maintenance: Pap:   01/26/15 neg. HR HPV:neg  History of abnormal Pap:  no MMG:  BIRADS2:benign. F/u 3 years.  D/w pt starting screening mammograms this year. Colonoscopy:  never BMD:   never TDaP:  2015 Pneumonia vaccine(s):  never Shingrix:   never Hep C testing: never Screening Labs: has been done with Dr. Criss Rosales   reports that she has never smoked. She has never used smokeless tobacco. She reports current alcohol use. She reports that she does not use drugs.  Past Medical History:  Diagnosis Date  . Angio-edema   . GERD (gastroesophageal reflux disease)   . HSV-2 infection    2008  . IBS (irritable bowel syndrome)   . IC (interstitial cystitis)    2008  . Migraine   . Pregnancy induced hypertension   . Urticaria     Past Surgical History:  Procedure Laterality Date  . ADENOIDECTOMY    . BREAST SURGERY  2002   right breast biopsy Benign  . CESAREAN SECTION N/A 08/30/2013   Procedure: CESAREAN SECTION;  Surgeon: Marvene Staff, MD;  Location: [facility];  Service: Obstetrics;  Laterality: N/A;  . KNEE ARTHROSCOPY  3/11, 2012   right knee  . SHOULDER ARTHROSCOPY    . TONSILLECTOMY    . WISDOM TOOTH EXTRACTION      Current Outpatient Medications  Medication Sig Dispense  Refill  . cetirizine (ZYRTEC ALLERGY) 10 MG tablet Take 1 tablet (10 mg total) by mouth daily. 30 tablet 5  . cyclobenzaprine (FLEXERIL) 10 MG tablet Take 10 mg by mouth at bedtime as needed.  0  . fluticasone (FLONASE) 50 MCG/ACT nasal spray Place 2 sprays into both nostrils 2 (two) times daily as needed for allergies.     Marland Kitchen ibuprofen (ADVIL,MOTRIN) 800 MG tablet Take 1 tablet (800 mg total) by mouth every 8 (eight) hours as needed. 30 tablet 0  . indapamide (LOZOL) 1.25 MG tablet Take 1.25 mg by mouth daily.    Marland Kitchen lisdexamfetamine (VYVANSE) 40 MG capsule Take 40 mg by mouth every morning.    . montelukast (SINGULAIR) 10 MG tablet Take 1 tablet by mouth daily.  5  . olopatadine (PATANOL) 0.1 % ophthalmic solution INSTILL 1 DROP INTO BOTH EYES TWICE A DAY  3  . Prenatal Vit-Fe Fumarate-FA (PRENATAL MULTIVITAMIN) TABS tablet Take 1 tablet by mouth daily at 12 noon.    . rizatriptan (MAXALT) 10 MG tablet Take 10 mg by mouth as needed.     . valACYclovir (VALTREX) 500 MG tablet TAKE 1 TABLET BY MOUTH TWO TIMES DAILY. 180 tablet 3  . NIFEdipine (PROCARDIA XL/NIFEDICAL XL) 60 MG 24 hr tablet Take 1  tablet (60 mg total) by mouth daily.     No current facility-administered medications for this visit.     Family History  Problem Relation Age of Onset  . Diabetes Mother   . Hyperlipidemia Mother   . Anemia Mother   . Diabetes Maternal Grandmother   . Heart failure Maternal Grandmother   . Diabetes Paternal Grandmother   . Stroke Paternal Grandmother   . Renal Disease Paternal Grandfather   . Heart disease Paternal Grandfather   . Stroke Paternal Grandfather   . Hyperlipidemia Father   . Hypertension Father   . Heart disease Father   . Heart disease Brother   . Prostate cancer Maternal Grandfather   . Hypertension Sister   . Hypertension Sister   . Thyroid disease Sister   . Anemia Maternal Aunt   . Prostate cancer Paternal Uncle     Review of Systems  Constitutional:       Weight  gain  HENT: Negative.   Eyes: Negative.   Respiratory: Negative.   Cardiovascular: Negative.   Gastrointestinal: Negative.   Endocrine: Negative.   Genitourinary: Negative.   Musculoskeletal: Negative.   Skin: Negative.   Allergic/Immunologic: Negative.   Neurological: Negative.   Hematological: Negative.   Psychiatric/Behavioral: Negative.     Exam:   BP 128/78 (BP Location: Right Arm, Patient Position: Sitting, Cuff Size: Normal)   Pulse 88   Resp 16   Ht 5' 3.5" (1.613 m)   Wt 162 lb (73.5 kg)   LMP 05/08/2018   Breastfeeding Unknown   BMI 28.25 kg/m   Height:   Height: 5' 3.5" (161.3 cm)  Ht Readings from Last 3 Encounters:  05/22/18 5' 3.5" (1.613 m)  04/10/18 5' 3.5" (1.613 m)  12/26/17 5' 3.25" (1.607 m)    General appearance: alert, cooperative and appears stated age Head: Normocephalic, without obvious abnormality, atraumatic Neck: no adenopathy, supple, symmetrical, trachea midline and thyroid normal to inspection and palpation Lungs: clear to auscultation bilaterally Breasts: normal appearance, no masses or tenderness Heart: regular rate and rhythm Abdomen: soft, non-tender; bowel sounds normal; no masses,  no organomegaly Extremities: extremities normal, atraumatic, no cyanosis or edema Skin: Skin color, texture, turgor normal. No rashes or lesions Lymph nodes: Cervical, supraclavicular, and axillary nodes normal. No abnormal inguinal nodes palpated Neurologic: Grossly normal   Pelvic: External genitalia:  no lesions              Urethra:  normal appearing urethra with no masses, tenderness or lesions              Bartholins and Skenes: normal                 Vagina: normal appearing vagina with normal color and discharge, no lesions              Cervix: no lesions              Pap taken: Yes.   Bimanual Exam:  Uterus:  normal size, contour, position, consistency, mobility, non-tender              Adnexa: normal adnexa and no mass, fullness,  tenderness               Rectovaginal: Confirms               Anus:  normal sphincter tone, no lesions  Chaperone was present for exam.  A:  Well Woman with normal exam Hypertension H/O miscarriage x 2 (12/17 and  7/19) with lower AMH H/O HSV Urinary urgency Migraines Sinus and allergy issues  P:   Mammogram guidelines reviewed.  Recommended proceeding with starting this year pap smear with HR HPV obtained today Valtrex 1gm daily.   Use BID x 3 days with outbreaks.  #30/12 RF. Does not desire any additional fertility treatment at this time. Urine culture Return annually or prn

## 2018-05-22 ENCOUNTER — Ambulatory Visit (INDEPENDENT_AMBULATORY_CARE_PROVIDER_SITE_OTHER): Payer: 59 | Admitting: Obstetrics & Gynecology

## 2018-05-22 ENCOUNTER — Encounter: Payer: Self-pay | Admitting: Obstetrics & Gynecology

## 2018-05-22 ENCOUNTER — Other Ambulatory Visit (HOSPITAL_COMMUNITY)
Admission: RE | Admit: 2018-05-22 | Discharge: 2018-05-22 | Disposition: A | Discharge status: Home / Self Care | Payer: 59 | Source: Ambulatory Visit | Attending: Obstetrics & Gynecology | Admitting: Obstetrics & Gynecology

## 2018-05-22 ENCOUNTER — Other Ambulatory Visit: Payer: Self-pay

## 2018-05-22 VITALS — BP 128/78 | HR 88 | Resp 16 | Ht 63.5 in | Wt 162.0 lb

## 2018-05-22 DIAGNOSIS — Z124 Encounter for screening for malignant neoplasm of cervix: Secondary | ICD-10-CM | POA: Diagnosis not present

## 2018-05-22 DIAGNOSIS — R350 Frequency of micturition: Secondary | ICD-10-CM

## 2018-05-22 DIAGNOSIS — N898 Other specified noninflammatory disorders of vagina: Secondary | ICD-10-CM | POA: Insufficient documentation

## 2018-05-22 DIAGNOSIS — Z01419 Encounter for gynecological examination (general) (routine) without abnormal findings: Secondary | ICD-10-CM | POA: Diagnosis not present

## 2018-05-22 LAB — POCT URINALYSIS DIPSTICK
Bilirubin, UA: NEGATIVE
Blood, UA: NEGATIVE
Glucose, UA: NEGATIVE
Ketones, UA: NEGATIVE
Leukocytes, UA: NEGATIVE
Nitrite, UA: NEGATIVE
Protein, UA: NEGATIVE
UROBILINOGEN UA: NEGATIVE U/dL — AB
pH, UA: 8 (ref 5.0–8.0)

## 2018-05-22 MED ORDER — NIFEDIPINE ER OSMOTIC RELEASE 60 MG PO TB24: 60.0000 mg | ORAL_TABLET | Freq: Every day | ORAL | Status: DC

## 2018-05-22 MED ORDER — VALACYCLOVIR HCL 1 G PO TABS: 1000.0000 mg | ORAL_TABLET | Freq: Every day | ORAL | 12 refills | Status: DC

## 2018-05-22 NOTE — Addendum Note (Signed)
Addended by: Yates Decamp on: 05/22/2018 04:33 PM   Modules accepted: Orders

## 2018-05-23 LAB — URINE CULTURE

## 2018-05-26 LAB — CYTOLOGY - PAP
Bacterial vaginitis: NEGATIVE
Candida vaginitis: NEGATIVE
Diagnosis: NEGATIVE
HPV: NOT DETECTED

## 2018-06-11 ENCOUNTER — Encounter: Payer: Self-pay | Admitting: Allergy

## 2018-06-11 ENCOUNTER — Ambulatory Visit (INDEPENDENT_AMBULATORY_CARE_PROVIDER_SITE_OTHER): Payer: 59 | Admitting: Allergy

## 2018-06-11 VITALS — BP 130/82 | HR 97 | Temp 97.9°F | Resp 20 | Ht 63.7 in | Wt 165.6 lb

## 2018-06-11 DIAGNOSIS — L503 Dermatographic urticaria: Secondary | ICD-10-CM | POA: Diagnosis not present

## 2018-06-11 DIAGNOSIS — L508 Other urticaria: Secondary | ICD-10-CM

## 2018-06-11 DIAGNOSIS — J3089 Other allergic rhinitis: Secondary | ICD-10-CM

## 2018-06-11 MED ORDER — CETIRIZINE HCL 10 MG PO TABS: 10.0000 mg | ORAL_TABLET | Freq: Two times a day (BID) | ORAL | 5 refills | Status: DC

## 2018-06-11 NOTE — Progress Notes (Signed)
Follow Up Note  RE: Denise [name] MRN: [id number] DOB: 03/27/1976 Date of Office Visit: 06/11/2018  Referring provider: Lucianne Lei, MD Primary care provider: Lucianne Lei, MD  Chief Complaint: Follow-up (Refills)  History of Present Illness: I had the pleasure of seeing Denise [name] for a follow up visit at the [facility] on 06/11/2018. She is a 43 y.o. female, who is being followed for urticaria, dermatographism and allergic rhinitis. Today she is here for regular follow up visit. Her previous allergy office visit was on 04/10/2018 with Dr. Maudie Mercury.   Chronic urticaria Currently on zyrtec 10mg  daily and it seems to control her symptoms fairly well. She had to increase to BID once since the last visit and she also noticed flares if she misses a dose.   Other allergic rhinitis Using Singulair and Allegra D as needed during sinus issues. 1 course of antibiotics for sinus infection since last visit in November.   Assessment and Plan: Denise [name] is a 43 y.o. female with: Chronic urticaria Past history - Pruritic hives/welts on and off for the past 10 months. Seemed to be worse since she had miscarriage and seem to occur more frequently during her menses. Tried benadryl prn with good benefit. Denies any changes in diet, medications, personal care products or infections. Positive dermatographism on exam.  Interim history - Much better with daily zyrtec. Bloodwork unremarkable - Thyroid Cascade Profile, Comprehensive metabolic panel, CBC with Differential/Platelet, ANA w/Reflex  Continue zyrtec 10mg  once a day and may increase to twice a day during flares.   Avoid the following potential triggers: alcohol, tight clothing, NSAIDs.    Dermatographism See assessment and plan as above. Zyrtec should help with this.   Perennial allergic rhinitis Past history - Rhinitis symptoms for the past 5 years mainly during the spring and summer.  Using Singulair and Allegra as needed with  good benefit. 2019 skin testing was positive to dust mites and cats.  Interim history - 1 sinus infection needing antibiotics otherwise well controlled.  Continue environmental control measures to dust mites and cats.   Monitor sinus issues.  May use Flonase 1-2 sprays daily as needed.   May use Singulair 10mg  daily as needed.   Return in about 6 months (around 12/10/2018).  Meds ordered this encounter  Medications  . cetirizine (ZYRTEC ALLERGY) 10 MG tablet    Sig: Take 1 tablet (10 mg total) by mouth 2 (two) times daily.    Dispense:  60 tablet    Refill:  5   Diagnostics: None.  Medication List:  Current Outpatient Medications  Medication Sig Dispense Refill  . cetirizine (ZYRTEC ALLERGY) 10 MG tablet Take 1 tablet (10 mg total) by mouth 2 (two) times daily. 60 tablet 5  . cyclobenzaprine (FLEXERIL) 10 MG tablet Take 10 mg by mouth at bedtime as needed.  0  . fluticasone (FLONASE) 50 MCG/ACT nasal spray Place 2 sprays into both nostrils 2 (two) times daily as needed for allergies.     Marland Kitchen ibuprofen (ADVIL,MOTRIN) 800 MG tablet Take 1 tablet (800 mg total) by mouth every 8 (eight) hours as needed. 30 tablet 0  . indapamide (LOZOL) 1.25 MG tablet Take 1.25 mg by mouth daily.    Marland Kitchen lisdexamfetamine (VYVANSE) 40 MG capsule Take 40 mg by mouth every morning.    . montelukast (SINGULAIR) 10 MG tablet Take 1 tablet by mouth daily.  5  . NIFEdipine (PROCARDIA XL/NIFEDICAL XL) 60 MG 24 hr tablet Take 1 tablet (  60 mg total) by mouth daily.    Marland Kitchen olopatadine (PATANOL) 0.1 % ophthalmic solution INSTILL 1 DROP INTO BOTH EYES TWICE A DAY  3  . Prenatal Vit-Fe Fumarate-FA (PRENATAL MULTIVITAMIN) TABS tablet Take 1 tablet by mouth daily at 12 noon.    . rizatriptan (MAXALT) 10 MG tablet Take 10 mg by mouth as needed.     . valACYclovir (VALTREX) 1000 MG tablet Take 1 tablet (1,000 mg total) by mouth daily. 30 tablet 12   No current facility-administered medications for this visit.     Allergies: Allergies  Allergen Reactions  . Ace Inhibitors   . Adhesive [Tape] Rash  . Latex Itching and Rash    Rash and itching from use of gloves   I reviewed her past medical history, social history, family history, and environmental history and no significant changes have been reported from previous visit on 04/10/2018.  Review of Systems  Constitutional: Negative for appetite change, chills, fever and unexpected weight change.  HENT: Negative for congestion and rhinorrhea.   Eyes: Negative for itching.  Respiratory: Negative for cough, chest tightness, shortness of breath and wheezing.   Cardiovascular: Negative for chest pain.  Gastrointestinal: Negative for abdominal pain.  Genitourinary: Negative for difficulty urinating.  Skin: Positive for rash.  Allergic/Immunologic: Positive for environmental allergies. Negative for food allergies.  Neurological: Negative for headaches.   Objective: BP 130/82 (BP Location: Left Arm, Patient Position: Sitting, Cuff Size: Normal)   Pulse 97   Temp 97.9 F (36.6 C) (Oral)   Resp 20   Ht 5' 3.7" (1.618 m)   Wt 165 lb 9.6 oz (75.1 kg)   SpO2 98%   BMI 28.69 kg/m  Body mass index is 28.69 kg/m. Physical Exam  Constitutional: She is oriented to person, place, and time. She appears well-developed and well-nourished.  HENT:  Head: Normocephalic and atraumatic.  Right Ear: External ear normal.  Left Ear: External ear normal.  Nose: Nose normal.  Mouth/Throat: Oropharynx is clear and moist.  Eyes: Conjunctivae and EOM are normal.  Neck: Neck supple.  Cardiovascular: Normal rate, regular rhythm and normal heart sounds. Exam reveals no gallop and no friction rub.  No murmur heard. Pulmonary/Chest: Effort normal and breath sounds normal. She has no wheezes. She has no rales.  Abdominal: Soft.  Neurological: She is alert and oriented to person, place, and time.  Skin: Skin is warm. No rash noted.  +2 dermatographism  Psychiatric:  She has a normal mood and affect. Her behavior is normal.  Nursing note and vitals reviewed.  Previous notes and tests were reviewed. The plan was reviewed with the patient/family, and all questions/concerned were addressed.  It was my pleasure to see Denise [name] today and participate in her care. Please feel free to contact me with any questions or concerns.  Sincerely,  Rexene Alberts, DO Allergy & Immunology  [facility] office: [phone] [facility] office: [phone]

## 2018-06-11 NOTE — Assessment & Plan Note (Signed)
Past history - Rhinitis symptoms for the past 5 years mainly during the spring and summer.  Using Singulair and Allegra as needed with good benefit. 2019 skin testing was positive to dust mites and cats.  Interim history - 1 sinus infection needing antibiotics otherwise well controlled.  Continue environmental control measures to dust mites and cats.   Monitor sinus issues.  May use Flonase 1-2 sprays daily as needed.   May use Singulair 10mg  daily as needed.

## 2018-06-11 NOTE — Patient Instructions (Addendum)
Chronic urticaria  Continue zyrtec 10mg  once a day and may increase to twice a day during flares.   Avoid the following potential triggers: alcohol, tight clothing, NSAIDs.   Other allergic rhinitis  Continue environmental control measures to dust mites and cats.   Monitor sinus issues.  May use Flonase 1-2 sprays daily as needed.   May use Singulair 10mg  daily as needed.  Follow up in 6 months

## 2018-06-11 NOTE — Assessment & Plan Note (Signed)
Past history - Pruritic hives/welts on and off for the past 10 months. Seemed to be worse since she had miscarriage and seem to occur more frequently during her menses. Tried benadryl prn with good benefit. Denies any changes in diet, medications, personal care products or infections. Positive dermatographism on exam.  Interim history - Much better with daily zyrtec. Bloodwork unremarkable - Thyroid Cascade Profile, Comprehensive metabolic panel, CBC with Differential/Platelet, ANA w/Reflex  Continue zyrtec 10mg  once a day and may increase to twice a day during flares.   Avoid the following potential triggers: alcohol, tight clothing, NSAIDs.

## 2018-06-11 NOTE — Assessment & Plan Note (Signed)
See assessment and plan as above. Zyrtec should help with this.

## 2018-06-21 DIAGNOSIS — I10 Essential (primary) hypertension: Secondary | ICD-10-CM | POA: Diagnosis not present

## 2018-06-21 DIAGNOSIS — J399 Disease of upper respiratory tract, unspecified: Secondary | ICD-10-CM | POA: Diagnosis not present

## 2018-06-21 DIAGNOSIS — E785 Hyperlipidemia, unspecified: Secondary | ICD-10-CM | POA: Diagnosis not present

## 2018-06-21 DIAGNOSIS — J029 Acute pharyngitis, unspecified: Secondary | ICD-10-CM | POA: Diagnosis not present

## 2018-08-05 DIAGNOSIS — J398 Other specified diseases of upper respiratory tract: Secondary | ICD-10-CM | POA: Diagnosis not present

## 2018-08-05 DIAGNOSIS — J00 Acute nasopharyngitis [common cold]: Secondary | ICD-10-CM | POA: Diagnosis not present

## 2018-08-05 DIAGNOSIS — J019 Acute sinusitis, unspecified: Secondary | ICD-10-CM | POA: Diagnosis not present

## 2018-08-05 DIAGNOSIS — J0110 Acute frontal sinusitis, unspecified: Secondary | ICD-10-CM | POA: Diagnosis not present

## 2018-10-07 ENCOUNTER — Other Ambulatory Visit: Payer: Self-pay | Admitting: *Deleted

## 2018-10-07 MED ORDER — CETIRIZINE HCL 10 MG PO TABS: 10.0000 mg | ORAL_TABLET | Freq: Two times a day (BID) | ORAL | 5 refills | Status: DC

## 2018-12-10 ENCOUNTER — Ambulatory Visit: Payer: 59 | Admitting: Allergy

## 2018-12-17 ENCOUNTER — Ambulatory Visit: Payer: 59 | Admitting: Allergy

## 2018-12-17 ENCOUNTER — Other Ambulatory Visit: Payer: Self-pay

## 2018-12-17 ENCOUNTER — Encounter: Payer: Self-pay | Admitting: Allergy

## 2018-12-17 VITALS — BP 122/80 | HR 100 | Temp 98.4°F | Resp 16 | Ht 64.0 in | Wt 167.6 lb

## 2018-12-17 DIAGNOSIS — L508 Other urticaria: Secondary | ICD-10-CM

## 2018-12-17 DIAGNOSIS — J3089 Other allergic rhinitis: Secondary | ICD-10-CM

## 2018-12-17 DIAGNOSIS — L503 Dermatographic urticaria: Secondary | ICD-10-CM

## 2018-12-17 MED ORDER — CETIRIZINE HCL 10 MG PO TABS: 10.0000 mg | ORAL_TABLET | Freq: Two times a day (BID) | ORAL | 5 refills | Status: DC

## 2018-12-17 MED ORDER — FLUTICASONE PROPIONATE 50 MCG/ACT NA SUSP: 1.0000 | Freq: Two times a day (BID) | NASAL | 5 refills | Status: DC | PRN

## 2018-12-17 NOTE — Assessment & Plan Note (Signed)
Past history - Pruritic hives/welts on and off for the past 10 months. Seemed to be worse since she had miscarriage and seem to occur more frequently during her menses. Tried benadryl prn with good benefit. Positive dermatographism on exam. Bloodwork unremarkable - Thyroid Cascade Profile, Comprehensive metabolic panel, CBC with Differential/Platelet, ANA w/Reflex Interim history - controlled with daily zyrtec.   Continue zyrtec 10mg  once a day and may increase to twice a day during flares.   Avoid the following potential triggers: alcohol, tight clothing, NSAIDs.

## 2018-12-17 NOTE — Progress Notes (Signed)
Follow Up Note  RE: Denise [name] MRN: [id number] DOB: 01/10/1976 Date of Office Visit: 12/17/2018  Referring provider: Lucianne Lei, MD Primary care provider: Lucianne Lei, MD  Chief Complaint: Urticaria and Sinus Problem (feeling pressure in her face when leaning forward)  History of Present Illness: I had the pleasure of seeing Denise [name] for a follow up visit at the [facility] [place] [facility] [place] on 12/17/2018. She is a 43 y.o. female, who is being followed for urticaria, allergic rhinitis. Today she is here for regular follow up visit. Her previous allergy office visit was on 06/11/2018 with Dr. Maudie Mercury.   Chronic urticaria Minimal outbreaks since the last visit as long as she takes zyrtec 10mg  daily. If miss a few days then noticing the itching and rash.   Perennial allergic rhinitis Sinus infection in March which required antibiotics.  Now having issues with sinus pressure for the past few days. No drainage, fevers or chills.  Currently not doing any nasal sprays since March. Also stopped Singulair in March.  Taking zyrtec daily.   Assessment and Plan: Denise [name] is a 43 y.o. female with: Perennial allergic rhinitis Past history - Rhinitis symptoms for the past 5 years mainly during the spring and summer.  Using Singulair and Allegra as needed with good benefit. 2019 skin testing was positive to dust mites and cats.  Interim history - 1 sinus infection needing antibiotics in March 2020. Now having sinus pressure again. Not taking any nasal sprays.   Continue environmental control measures to dust mites and cats.   Start Flonase 1 spray in each nostril twice a day until sinus congestion improves then use as needed.  If not better and having worsening symptoms let us know.   Chronic urticaria Past history - Pruritic hives/welts on and off for the past 10 months. Seemed to be worse since she had miscarriage and seem to occur more frequently during her menses. Tried benadryl prn with  good benefit. Positive dermatographism on exam. Bloodwork unremarkable - Thyroid Cascade Profile, Comprehensive metabolic panel, CBC with Differential/Platelet, ANA w/Reflex Interim history - controlled with daily zyrtec.   Continue zyrtec 10mg  once a day and may increase to twice a day during flares.   Avoid the following potential triggers: alcohol, tight clothing, NSAIDs.    Dermatographism  See assessment and plan as above for chronic urticaria. Zyrtec should help with this.   Return in about 6 months (around 06/19/2019).  Meds ordered this encounter  Medications  . cetirizine (ZYRTEC ALLERGY) 10 MG tablet    Sig: Take 1 tablet (10 mg total) by mouth 2 (two) times daily.    Dispense:  60 tablet    Refill:  5  . fluticasone (FLONASE) 50 MCG/ACT nasal spray    Sig: Place 1 spray into both nostrils 2 (two) times daily as needed for allergies or rhinitis.    Dispense:  16 g    Refill:  5   Diagnostics: None.  Medication List:  Current Outpatient Medications  Medication Sig Dispense Refill  . cetirizine (ZYRTEC ALLERGY) 10 MG tablet Take 1 tablet (10 mg total) by mouth 2 (two) times daily. 60 tablet 5  . cyclobenzaprine (FLEXERIL) 10 MG tablet Take 10 mg by mouth at bedtime as needed.  0  . fluticasone (FLONASE) 50 MCG/ACT nasal spray Place 1 spray into both nostrils 2 (two) times daily as needed for allergies or rhinitis. 16 g 5  . ibuprofen (ADVIL,MOTRIN) 800 MG tablet Take 1 tablet (800 mg  total) by mouth every 8 (eight) hours as needed. 30 tablet 0  . indapamide (LOZOL) 1.25 MG tablet Take 1.25 mg by mouth daily.    Marland Kitchen lisdexamfetamine (VYVANSE) 40 MG capsule Take 40 mg by mouth every morning.    Marland Kitchen NIFEdipine (PROCARDIA XL/NIFEDICAL XL) 60 MG 24 hr tablet Take 1 tablet (60 mg total) by mouth daily.    Marland Kitchen olopatadine (PATANOL) 0.1 % ophthalmic solution INSTILL 1 DROP INTO BOTH EYES TWICE A DAY  3  . rizatriptan (MAXALT) 10 MG tablet Take 10 mg by mouth as needed.     .  valACYclovir (VALTREX) 1000 MG tablet Take 1 tablet (1,000 mg total) by mouth daily. 30 tablet 12  . Prenatal Vit-Fe Fumarate-FA (PRENATAL MULTIVITAMIN) TABS tablet Take 1 tablet by mouth daily at 12 noon.     No current facility-administered medications for this visit.    Allergies: Allergies  Allergen Reactions  . Ace Inhibitors   . Adhesive [Tape] Rash  . Latex Itching and Rash    Rash and itching from use of gloves   I reviewed her past medical history, social history, family history, and environmental history and no significant changes have been reported from previous visit on 06/11/2018.  Review of Systems  Constitutional: Negative for appetite change, chills, fever and unexpected weight change.  HENT: Positive for sinus pressure. Negative for congestion and rhinorrhea.   Eyes: Negative for itching.  Respiratory: Negative for cough, chest tightness, shortness of breath and wheezing.   Cardiovascular: Negative for chest pain.  Gastrointestinal: Negative for abdominal pain.  Genitourinary: Negative for difficulty urinating.  Skin: Negative for rash.  Allergic/Immunologic: Positive for environmental allergies. Negative for food allergies.  Neurological: Negative for headaches.   Objective: BP 122/80 (BP Location: Right Arm, Patient Position: Sitting, Cuff Size: Large)   Pulse 100   Temp 98.4 F (36.9 C) (Temporal)   Resp 16   Ht 5\' 4"  (1.626 m)   Wt 167 lb 9.6 oz (76 kg)   SpO2 96%   BMI 28.77 kg/m  Body mass index is 28.77 kg/m. Physical Exam  Constitutional: She is oriented to person, place, and time. She appears well-developed and well-nourished.  HENT:  Head: Normocephalic and atraumatic.  Right Ear: External ear normal.  Left Ear: External ear normal.  Nose: Nose normal.  Mouth/Throat: Oropharynx is clear and moist.  Eyes: Conjunctivae and EOM are normal.  Neck: Neck supple.  Cardiovascular: Normal rate, regular rhythm and normal heart sounds. Exam reveals no  gallop and no friction rub.  No murmur heard. Pulmonary/Chest: Effort normal and breath sounds normal. She has no wheezes. She has no rales.  Abdominal: Soft.  Neurological: She is alert and oriented to person, place, and time.  Skin: Skin is warm. No rash noted.  Psychiatric: She has a normal mood and affect. Her behavior is normal.  Nursing note and vitals reviewed.  Previous notes and tests were reviewed. The plan was reviewed with the patient/family, and all questions/concerned were addressed.  It was my pleasure to see Cori today and participate in her care. Please feel free to contact me with any questions or concerns.  Sincerely,  Rexene Alberts, DO Allergy & Immunology  [facility] office: [phone] [facility] office: [place] office: [phone]

## 2018-12-17 NOTE — Assessment & Plan Note (Signed)
   See assessment and plan as above for chronic urticaria. Zyrtec should help with this.

## 2018-12-17 NOTE — Assessment & Plan Note (Signed)
Past history - Rhinitis symptoms for the past 5 years mainly during the spring and summer.  Using Singulair and Allegra as needed with good benefit. 2019 skin testing was positive to dust mites and cats.  Interim history - 1 sinus infection needing antibiotics in March 2020. Now having sinus pressure again. Not taking any nasal sprays.   Continue environmental control measures to dust mites and cats.   Start Flonase 1 spray in each nostril twice a day until sinus congestion improves then use as needed.  If not better and having worsening symptoms let us know.

## 2018-12-17 NOTE — Patient Instructions (Addendum)
Chronic urticaria  Continue zyrtec 10mg  once a day and may increase to twice a day during flares.   Avoid the following potential triggers: alcohol, tight clothing, NSAIDs.  Perennial allergic rhinitis 2019 skin testing was positive to dust mites and cats.   Continue environmental control measures to dust mites and cats.   Start Flonase 1 spray in each nostril twice a day until sinus congestion improves then use as needed.  If not better and having worsening symptoms let us know.    Follow up in 6 months or sooner if needed.  Control of House Dust Mite Allergen . Dust mite allergens are a common trigger of allergy and asthma symptoms. While they can be found throughout the house, these microscopic creatures thrive in warm, humid environments such as bedding, upholstered furniture and carpeting. . Because so much time is spent in the bedroom, it is essential to reduce mite levels there.  . Encase pillows, mattresses, and box springs in special allergen-proof fabric covers or airtight, zippered plastic covers.  . Bedding should be washed weekly in hot water (130 F) and dried in a hot dryer. Allergen-proof covers are available for comforters and pillows that can't be regularly washed.  Wendee Copp the allergy-proof covers every few months. Minimize clutter in the bedroom. Keep pets out of the bedroom.  Marland Kitchen Keep humidity less than 50% by using a dehumidifier or air conditioning. You can buy a humidity measuring device called a hygrometer to monitor this.  . If possible, replace carpets with hardwood, linoleum, or washable area rugs. If that's not possible, vacuum frequently with a vacuum that has a HEPA filter. . Remove all upholstered furniture and non-washable window drapes from the bedroom. . Remove all non-washable stuffed toys from the bedroom.  Wash stuffed toys weekly.

## 2019-01-21 ENCOUNTER — Other Ambulatory Visit: Payer: Self-pay | Admitting: Family Medicine

## 2019-01-21 ENCOUNTER — Ambulatory Visit
Admission: RE | Admit: 2019-01-21 | Discharge: 2019-01-21 | Disposition: A | Discharge status: Home / Self Care | Payer: 59 | Source: Ambulatory Visit | Attending: Family Medicine | Admitting: Family Medicine

## 2019-01-21 DIAGNOSIS — R1010 Upper abdominal pain, unspecified: Secondary | ICD-10-CM

## 2019-06-03 ENCOUNTER — Other Ambulatory Visit: Payer: Self-pay

## 2019-06-04 ENCOUNTER — Encounter: Payer: Self-pay | Admitting: Obstetrics & Gynecology

## 2019-06-04 ENCOUNTER — Ambulatory Visit (INDEPENDENT_AMBULATORY_CARE_PROVIDER_SITE_OTHER): Payer: BC Managed Care – PPO | Admitting: Obstetrics & Gynecology

## 2019-06-04 VITALS — BP 116/78 | HR 96 | Temp 98.6°F | Ht 63.25 in | Wt 164.6 lb

## 2019-06-04 DIAGNOSIS — R350 Frequency of micturition: Secondary | ICD-10-CM

## 2019-06-04 DIAGNOSIS — Z01419 Encounter for gynecological examination (general) (routine) without abnormal findings: Secondary | ICD-10-CM | POA: Diagnosis not present

## 2019-06-04 DIAGNOSIS — N926 Irregular menstruation, unspecified: Secondary | ICD-10-CM

## 2019-06-04 LAB — POCT URINALYSIS DIPSTICK
Bilirubin, UA: NEGATIVE
Blood, UA: NEGATIVE
Glucose, UA: NEGATIVE
Ketones, UA: NEGATIVE
Leukocytes, UA: NEGATIVE
Nitrite, UA: NEGATIVE
Protein, UA: NEGATIVE
Urobilinogen, UA: 0.2 E.U./dL
pH, UA: 5 (ref 5.0–8.0)

## 2019-06-04 MED ORDER — VALACYCLOVIR HCL 1 G PO TABS: 1000.0000 mg | ORAL_TABLET | Freq: Every day | ORAL | 4 refills | Status: DC

## 2019-06-04 NOTE — Progress Notes (Signed)
44 y.o. G50P1011 Married [place] American female here for annual exam.  Doing well.  Grandmother passed in May.  She was 92.  Got married in November.  Was really happy with how the day turned out.  It was at [place].  Quit job in November.  She is not working right now and that is a good think for her.    Cycles have been between 23-25 days.  She occasionally has a cycle that is 30-40 days.  Doesn't use contraception.    Patient's last menstrual period was 05/18/2019.          Sexually active: Yes.    The current method of family planning is none.    Exercising: No.  The patient does not participate in regular exercise at present. Smoker:  no  Health Maintenance: Pap:  05/22/18 Neg:Neg HR HPV  01/26/15 neg. HR HPV:neg History of abnormal Pap:  no MMG:  never TDaP:  2015 Screening Labs: PCP   reports that she has never smoked. She has never used smokeless tobacco. She reports current alcohol use. She reports that she does not use drugs.  Past Medical History:  Diagnosis Date  . Angio-edema   . GERD (gastroesophageal reflux disease)   . HSV-2 infection    2008  . IBS (irritable bowel syndrome)   . IC (interstitial cystitis)    2008  . Migraine   . Pregnancy induced hypertension   . Urticaria     Past Surgical History:  Procedure Laterality Date  . ADENOIDECTOMY    . BREAST SURGERY  2002   right breast biopsy Benign  . CESAREAN SECTION N/A 08/30/2013   Procedure: CESAREAN SECTION;  Surgeon: Marvene Staff, MD;  Location: [facility];  Service: Obstetrics;  Laterality: N/A;  . KNEE ARTHROSCOPY  3/11, 2012   right knee  . SHOULDER ARTHROSCOPY    . TONSILLECTOMY    . WISDOM TOOTH EXTRACTION      Current Outpatient Medications  Medication Sig Dispense Refill  . Bempedoic Acid (NEXLETOL) 180 MG TABS TAKE 1 TABLET BY MOUTH EVERY DAY NOT COVERED    . cetirizine (ZYRTEC ALLERGY) 10 MG tablet Take 1 tablet (10 mg total) by mouth 2 (two) times daily. 60 tablet 5   . fluticasone (FLONASE) 50 MCG/ACT nasal spray Place 1 spray into both nostrils 2 (two) times daily as needed for allergies or rhinitis. 16 g 5  . ibuprofen (ADVIL,MOTRIN) 800 MG tablet Take 1 tablet (800 mg total) by mouth every 8 (eight) hours as needed. 30 tablet 0  . indapamide (LOZOL) 1.25 MG tablet Take 1.25 mg by mouth daily.    Marland Kitchen lisdexamfetamine (VYVANSE) 40 MG capsule Take 40 mg by mouth every morning.    Marland Kitchen NIFEdipine (PROCARDIA XL/NIFEDICAL XL) 60 MG 24 hr tablet Take 1 tablet (60 mg total) by mouth daily.    Marland Kitchen olopatadine (PATANOL) 0.1 % ophthalmic solution INSTILL 1 DROP INTO BOTH EYES TWICE A DAY  3  . pantoprazole (PROTONIX) 40 MG tablet Take by mouth.    . Prenatal Vit-Fe Fumarate-FA (PRENATAL MULTIVITAMIN) TABS tablet Take 1 tablet by mouth daily at 12 noon.    . rizatriptan (MAXALT) 10 MG tablet Take 10 mg by mouth as needed.     . valACYclovir (VALTREX) 1000 MG tablet Take 1 tablet (1,000 mg total) by mouth daily. 30 tablet 12  . VITAMIN D PO Take by mouth.     No current facility-administered medications for this visit.  Family History  Problem Relation Age of Onset  . Diabetes Mother   . Hyperlipidemia Mother   . Anemia Mother   . Diabetes Maternal Grandmother   . Heart failure Maternal Grandmother   . Diabetes Paternal Grandmother   . Stroke Paternal Grandmother   . Renal Disease Paternal Grandfather   . Heart disease Paternal Grandfather   . Stroke Paternal Grandfather   . Hyperlipidemia Father   . Hypertension Father   . Heart disease Father   . Heart disease Brother   . Prostate cancer Maternal Grandfather   . Hypertension Sister   . Hypertension Sister   . Thyroid disease Sister   . Anemia Maternal Aunt   . Prostate cancer Paternal Uncle     Review of Systems  Genitourinary: Positive for frequency and urgency.  All other systems reviewed and are negative.   Exam:   BP 116/78 (BP Location: Right Arm, Patient Position: Sitting, Cuff Size:  Normal)   Pulse 96   Temp 98.6 F (37 C) (Temporal)   Ht 5' 3.25" (1.607 m)   Wt 164 lb 9.6 oz (74.7 kg)   LMP 05/18/2019   BMI 28.93 kg/m   Height: 5' 3.25" (160.7 cm)  Ht Readings from Last 3 Encounters:  06/04/19 5' 3.25" (1.607 m)  12/17/18 5\' 4"  (1.626 m)  06/11/18 5' 3.7" (1.618 m)   General appearance: alert, cooperative and appears stated age Head: Normocephalic, without obvious abnormality, atraumatic Neck: no adenopathy, supple, symmetrical, trachea midline and thyroid normal to inspection and palpation Lungs: clear to auscultation bilaterally Breasts: normal appearance, no masses or tenderness Heart: regular rate and rhythm Abdomen: soft, non-tender; bowel sounds normal; no masses,  no organomegaly Extremities: extremities normal, atraumatic, no cyanosis or edema Skin: Skin color, texture, turgor normal. No rashes or lesions Lymph nodes: Cervical, supraclavicular, and axillary nodes normal. No abnormal inguinal nodes palpated Neurologic: Grossly normal   Pelvic: External genitalia:  no lesions              Urethra:  normal appearing urethra with no masses, tenderness or lesions              Bartholins and Skenes: normal                 Vagina: normal appearing vagina with normal color and discharge, no lesions              Cervix: no lesions              Pap taken: No. Bimanual Exam:  Uterus:  normal size, contour, position, consistency, mobility, non-tender              Adnexa: normal adnexa and no mass, fullness, tenderness               Rectovaginal: Confirms               Anus:  normal sphincter tone, no lesions  Chaperone, Terence Lux, CMA, was present for exam.  A:  Well Woman with normal exam Hypertension H/O miscarriage x 2 (12/17 and 7/19) with lower AMH HSV hx H/o migraines hypertension Sinus/allergies  P:   Mammogram guidelines reviewed Pap smear with neg HR HPV 2019.  Not indicated today. Valtrex 1 gram daily.  Use BID x 3 days with  outbreaks.  #30/12RF. Declines fertility referral at this time [facility] obtained today Return annually or prn

## 2019-06-07 LAB — ANTI MULLERIAN HORMONE: ANTI-MULLERIAN HORMONE (AMH): 0.07 ng/mL

## 2019-06-22 ENCOUNTER — Ambulatory Visit: Payer: BC Managed Care – PPO | Admitting: Allergy

## 2019-06-22 ENCOUNTER — Encounter: Payer: Self-pay | Admitting: Allergy

## 2019-06-22 ENCOUNTER — Other Ambulatory Visit: Payer: Self-pay

## 2019-06-22 VITALS — BP 102/60 | HR 82 | Temp 98.0°F

## 2019-06-22 DIAGNOSIS — J3089 Other allergic rhinitis: Secondary | ICD-10-CM

## 2019-06-22 DIAGNOSIS — L503 Dermatographic urticaria: Secondary | ICD-10-CM

## 2019-06-22 DIAGNOSIS — L508 Other urticaria: Secondary | ICD-10-CM

## 2019-06-22 MED ORDER — CETIRIZINE HCL 10 MG PO TABS: 10.0000 mg | ORAL_TABLET | Freq: Two times a day (BID) | ORAL | 5 refills | Status: DC

## 2019-06-22 NOTE — Progress Notes (Signed)
Follow Up Note  RE: Denise [name] MRN: [id number] DOB: 11-Nov-1975 Date of Office Visit: 06/22/2019  Referring provider: Lucianne Lei, MD Primary care provider: Lucianne Lei, MD  Chief Complaint: Allergic Rhinitis  and Urticaria  History of Present Illness: I had the pleasure of seeing Denise [name] for a follow up visit at the [facility] [place] [facility] [place] on 06/22/2019. She is a 44 y.o. female, who is being followed for allergic rhinitis, urticaria, dermatographism. Her previous allergy office visit was on 12/17/2018 with Dr. Maudie Mercury. Today is a regular follow up visit.  Perennial allergic rhinitis No additional antibiotics or sinus infections since the last OV.  Used Flonase as needed during October. Now not on any nasal sprays.  Some sneezing in November and December.   Chronic urticaria Currently on zyrtec 10mg  once a day and doing well on it. Had to double up in November. She had increased stress which may be contributing to her hives. She has noted that if she misses her zyrtec then the hives seem to flare.   Assessment and Plan: Denise [name] is a 44 y.o. female with: Chronic urticaria Past history - Pruritic hives/welts on and off for the past 10 months. Seemed to be worse since she had miscarriage and seem to occur more frequently during her menses. Positive dermatographism on exam. Bloodwork unremarkable - Thyroid Cascade Profile, Comprehensive metabolic panel, CBC with Differential/Platelet, ANA w/Reflex Interim history - controlled with daily zyrtec, had to take BID dose during November, hives flare if misses a dose.   Continue zyrtec 10mg  once a day and may increase to twice a day during flares.   If hive free for 1 month then wean down zyrtec 10mg  to every other day.   Avoid the following potential triggers: alcohol, tight clothing, NSAIDs.  Perennial allergic rhinitis Past history - Rhinitis symptoms for the past 5 years mainly during the spring and summer.  Using Singulair  and Allegra as needed with good benefit. 2019 skin testing was positive to dust mites and cats.  Interim history - stable with Flonase prn and daily zyrtec.   Continue environmental control measures to dust mites and cats.  ? May use Flonase 1 spray in each nostril twice a day as needed.  Return in about 1 year (around 06/21/2020).  Meds ordered this encounter  Medications  . cetirizine (ZYRTEC ALLERGY) 10 MG tablet    Sig: Take 1 tablet (10 mg total) by mouth 2 (two) times daily.    Dispense:  60 tablet    Refill:  5   Diagnostics: None.  Medication List:  Current Outpatient Medications  Medication Sig Dispense Refill  . Bempedoic Acid (NEXLETOL) 180 MG TABS TAKE 1 TABLET BY MOUTH EVERY DAY NOT COVERED    . cetirizine (ZYRTEC ALLERGY) 10 MG tablet Take 1 tablet (10 mg total) by mouth 2 (two) times daily. 60 tablet 5  . fluticasone (FLONASE) 50 MCG/ACT nasal spray Place 1 spray into both nostrils 2 (two) times daily as needed for allergies or rhinitis. 16 g 5  . ibuprofen (ADVIL,MOTRIN) 800 MG tablet Take 1 tablet (800 mg total) by mouth every 8 (eight) hours as needed. 30 tablet 0  . indapamide (LOZOL) 1.25 MG tablet Take 1.25 mg by mouth daily.    Marland Kitchen lisdexamfetamine (VYVANSE) 40 MG capsule Take 40 mg by mouth every morning.    Marland Kitchen NIFEdipine (PROCARDIA XL/NIFEDICAL XL) 60 MG 24 hr tablet Take 1 tablet (60 mg total) by mouth daily.    Marland Kitchen  olopatadine (PATANOL) 0.1 % ophthalmic solution INSTILL 1 DROP INTO BOTH EYES TWICE A DAY  3  . pantoprazole (PROTONIX) 40 MG tablet Take by mouth.    . Prenatal Vit-Fe Fumarate-FA (PRENATAL MULTIVITAMIN) TABS tablet Take 1 tablet by mouth daily at 12 noon.    . rizatriptan (MAXALT) 10 MG tablet Take 10 mg by mouth as needed.     . valACYclovir (VALTREX) 1000 MG tablet Take 1 tablet (1,000 mg total) by mouth daily. 90 tablet 4  . VITAMIN D PO Take by mouth.     No current facility-administered medications for this visit.   Allergies: Allergies   Allergen Reactions  . Ace Inhibitors   . Adhesive [Tape] Rash  . Latex Itching and Rash    Rash and itching from use of gloves   I reviewed her past medical history, social history, family history, and environmental history and no significant changes have been reported from her previous visit.  Review of Systems  Constitutional: Negative for appetite change, chills, fever and unexpected weight change.  HENT: Negative for congestion, rhinorrhea and sinus pressure.   Eyes: Positive for itching.  Respiratory: Negative for cough, chest tightness, shortness of breath and wheezing.   Cardiovascular: Negative for chest pain.  Gastrointestinal: Positive for constipation. Negative for abdominal pain.  Genitourinary: Negative for difficulty urinating.  Skin: Negative for rash.  Allergic/Immunologic: Positive for environmental allergies. Negative for food allergies.  Neurological: Negative for headaches.   Objective: BP 102/60   Pulse 82   Temp 98 F (36.7 C) (Temporal)   SpO2 98%  There is no height or weight on file to calculate BMI. Physical Exam  Constitutional: She is oriented to person, place, and time. She appears well-developed and well-nourished.  HENT:  Head: Normocephalic and atraumatic.  Right Ear: External ear normal.  Left Ear: External ear normal.  Nose: Nose normal.  Mouth/Throat: Oropharynx is clear and moist.  Eyes: Conjunctivae and EOM are normal.  Cardiovascular: Normal rate, regular rhythm and normal heart sounds. Exam reveals no gallop and no friction rub.  No murmur heard. Pulmonary/Chest: Effort normal and breath sounds normal. She has no wheezes. She has no rales.  Abdominal: Soft.  Musculoskeletal:     Cervical back: Neck supple.  Neurological: She is alert and oriented to person, place, and time.  Skin: Skin is warm. No rash noted.  Psychiatric: She has a normal mood and affect. Her behavior is normal.  Nursing note and vitals reviewed.  Previous notes  and tests were reviewed. The plan was reviewed with the patient/family, and all questions/concerned were addressed.  It was my pleasure to see Denise [name] today and participate in her care. Please feel free to contact me with any questions or concerns.  Sincerely,  Rexene Alberts, DO Allergy & Immunology  [facility] office: [phone] [facility] office: [place] office: [phone]

## 2019-06-22 NOTE — Patient Instructions (Addendum)
Perennial allergic rhinitis 2019 skin testing was positive to dust mites and cats.   Continue environmental control measures to dust mites and cats.  ? May use Flonase 1 spray in each nostril twice a day as needed.  Nose Bleeds: . Nosebleeds are very common.  Site of the bleeding is typically on the septum or at the very front of the nose.  Some of the more common causes are from trauma, inflammation or medication induced. Marland Kitchen Pinch both nostrils while leaning forward for at least 5 minutes before checking to see if the bleeding has stopped. If bleeding is not controlled within 5-10 minutes apply a cotton ball soaked with oxymetazoline (Afrin) to the bleeding nostril for a few seconds.  Preventative treatment: . Apply saline nasal gel in each nostril twice a day for 2 weeks to allow the nasal mucosa to heal . Consider using a humidifier in the winter . Try to keep your blood pressure as normal as possible (120/80)  Chronic urticaria  Continue zyrtec 10mg  once a day and may increase to twice a day during flares.   If hive free for 1 month then try to take zyrtec every other day.   Avoid the following potential triggers: alcohol, tight clothing, NSAIDs.  Follow up in 1 year or sooner if needed.

## 2019-06-22 NOTE — Assessment & Plan Note (Signed)
Past history - Pruritic hives/welts on and off for the past 10 months. Seemed to be worse since she had miscarriage and seem to occur more frequently during her menses. Positive dermatographism on exam. Bloodwork unremarkable - Thyroid Cascade Profile, Comprehensive metabolic panel, CBC with Differential/Platelet, ANA w/Reflex Interim history - controlled with daily zyrtec, had to take BID dose during November, hives flare if misses a dose.   Continue zyrtec 10mg  once a day and may increase to twice a day during flares.   If hive free for 1 month then wean down zyrtec 10mg  to every other day.   Avoid the following potential triggers: alcohol, tight clothing, NSAIDs.

## 2019-06-22 NOTE — Assessment & Plan Note (Signed)
Past history - Rhinitis symptoms for the past 5 years mainly during the spring and summer.  Using Singulair and Allegra as needed with good benefit. 2019 skin testing was positive to dust mites and cats.  Interim history - stable with Flonase prn and daily zyrtec.   Continue environmental control measures to dust mites and cats.  ? May use Flonase 1 spray in each nostril twice a day as needed.

## 2019-07-18 DIAGNOSIS — I10 Essential (primary) hypertension: Secondary | ICD-10-CM | POA: Diagnosis not present

## 2019-07-18 DIAGNOSIS — J00 Acute nasopharyngitis [common cold]: Secondary | ICD-10-CM | POA: Diagnosis not present

## 2019-07-18 DIAGNOSIS — F064 Anxiety disorder due to known physiological condition: Secondary | ICD-10-CM | POA: Diagnosis not present

## 2019-07-18 DIAGNOSIS — E785 Hyperlipidemia, unspecified: Secondary | ICD-10-CM | POA: Diagnosis not present

## 2019-07-18 DIAGNOSIS — E040 Nontoxic diffuse goiter: Secondary | ICD-10-CM | POA: Diagnosis not present

## 2019-07-18 DIAGNOSIS — J029 Acute pharyngitis, unspecified: Secondary | ICD-10-CM | POA: Diagnosis not present

## 2019-08-27 ENCOUNTER — Ambulatory Visit: Payer: Self-pay | Attending: Family

## 2019-08-27 DIAGNOSIS — Z23 Encounter for immunization: Secondary | ICD-10-CM

## 2019-08-27 NOTE — Progress Notes (Signed)
   Covid-19 Vaccination Clinic  Name:  Denise [name]    MRN: [id number] DOB: 08-17-1975  08/27/2019  [name] was observed post Covid-19 immunization for 15 minutes without incident. She was provided with Vaccine Information Sheet and instruction to access the V-Safe system.   [name] was instructed to call 911 with any severe reactions post vaccine: Marland Kitchen Difficulty breathing  . Swelling of face and throat  . A fast heartbeat  . A bad rash all over body  . Dizziness and weakness   Immunizations Administered    Name Date Dose VIS Date Route   Moderna COVID-19 Vaccine 08/27/2019  4:15 PM 0.5 mL 04/28/2019 Intramuscular   Manufacturer: Moderna   Lot: [id number]   [id number]

## 2019-09-22 ENCOUNTER — Ambulatory Visit: Payer: 59 | Admitting: Obstetrics & Gynecology

## 2019-09-29 ENCOUNTER — Ambulatory Visit: Payer: Medicaid Other | Attending: Family

## 2019-09-29 DIAGNOSIS — Z23 Encounter for immunization: Secondary | ICD-10-CM

## 2019-09-29 NOTE — Progress Notes (Signed)
   Covid-19 Vaccination Clinic  Name:  Jaqulyn Skurka    MRN: [id number] DOB: 09-16-1975  09/29/2019  Ms. Buenger was observed post Covid-19 immunization for 15 minutes without incident. She was provided with Vaccine Information Sheet and instruction to access the V-Safe system.   Ms. Mooty was instructed to call 911 with any severe reactions post vaccine: Marland Kitchen Difficulty breathing  . Swelling of face and throat  . A fast heartbeat  . A bad rash all over body  . Dizziness and weakness   Immunizations Administered    Name Date Dose VIS Date Route   Moderna COVID-19 Vaccine 09/29/2019  4:23 PM 0.5 mL 04/2019 Intramuscular   Manufacturer: Moderna   Lot: [id number]   [place]BE:[id number]

## 2019-11-09 ENCOUNTER — Ambulatory Visit: Payer: Medicaid Other | Attending: Internal Medicine

## 2019-11-09 DIAGNOSIS — Z20822 Contact with and (suspected) exposure to covid-19: Secondary | ICD-10-CM

## 2019-11-10 LAB — SARS-COV-2, NAA 2 DAY TAT

## 2019-11-10 LAB — NOVEL CORONAVIRUS, NAA: SARS-CoV-2, NAA: NOT DETECTED

## 2019-11-17 ENCOUNTER — Telehealth: Payer: Self-pay

## 2019-11-17 NOTE — Telephone Encounter (Signed)
[name] 06/04/19 with [name]- 0.070 06/04/19  Spoke with pt. Pt states having vasomotor sx of hot flashes, night sweats, vaginal dryness and pain with intercourse since May 2021. Pt also states missing cycle on 11/12/19. Pt had regular cycles until this month. Pt has taken 3 UPTs at home and all are negative. Pt states sister went through early menopause at 42 years old.  Pt not on contraception due to HTN and migraines with aura. Pt requesting to see Dr Sabra Heck for vasomotor sx and possible treatment. Denies any bleeding or spotting or other concerns at this time.  Pt scheduled with Dr Sabra Heck on 6/24 at 3 pm. Pt agreeable and verbalized understanding to date and time of appt. CPS neg.  Pt did have negative Covid screening test x 2 on 6/14 and 6/19 after returning from [place].   Routing to Dr Sabra Heck.  Encounter closed.

## 2019-11-17 NOTE — Progress Notes (Signed)
GYNECOLOGY  VISIT  CC:   Skipped cycle  HPI: 44 y.o. G37P1011 Married [place] or [place] American female here for menopausal symptoms & amenorrhea.  Her last cycle was 09/28/2019.  That cycle was normal except she had browning discharge afterwards.  She got her second Moderna on 09/27/2019 and she started having hot flashes.  She did have some night sweats but this has improved.  She is having some vaginal dryness and some discomfort with intercourse and this is new.     Pt has hx of low AMH but has still been cycling regulalry.  She's also had a miscarriage in 12/18.  She just got back from [place] and wants to make sure pregnancy test is negative.  Has consumed ETOH.  Has done 3 negative UPTs.  She has noticed some increased vaginal moisture, discharge this past week.  Denies vaginal itching.  UPT-neg  GYNECOLOGIC HISTORY: Patient's last menstrual period was 09/28/2019 (exact date). Contraception: none Menopausal hormone therapy: none  Patient Active Problem List   Diagnosis Date Noted  . Perennial allergic rhinitis 04/10/2018  . Chronic urticaria 04/10/2018  . 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 Essential hypertension, benign 02/24/2016  . PIH (pregnancy induced hypertension) 08/29/2013  . Murmur 06/26/2013  . Palpitations 06/26/2013  . IBS (irritable bowel syndrome)   . Migraine   . GERD (gastroesophageal reflux disease)   . HSV-2 infection   . IC (interstitial cystitis)     Past Medical History:  Diagnosis Date  . Angio-edema   . GERD (gastroesophageal reflux disease)   . HSV-2 infection    2008  . IBS (irritable bowel syndrome)   . IC (interstitial cystitis)    2008  . Migraine   . Pregnancy induced hypertension   . Urticaria     Past Surgical History:  Procedure Laterality Date  . ADENOIDECTOMY    . BREAST SURGERY  2002   right breast biopsy Benign  . CESAREAN SECTION N/A 08/30/2013   Procedure: CESAREAN SECTION;  Surgeon: Marvene Staff, MD;  Location: [facility];   Service: Obstetrics;  Laterality: N/A;  . KNEE ARTHROSCOPY  3/11, 2012   right knee  . SHOULDER ARTHROSCOPY    . TONSILLECTOMY    . WISDOM TOOTH EXTRACTION      MEDS:   Current Outpatient Medications on File Prior to Visit  Medication Sig Dispense Refill  . Bempedoic Acid (NEXLETOL) 180 MG TABS TAKE 1 TABLET BY MOUTH EVERY DAY NOT COVERED    . cetirizine (ZYRTEC ALLERGY) 10 MG tablet Take 1 tablet (10 mg total) by mouth 2 (two) times daily. 60 tablet 5  . fluticasone (FLONASE) 50 MCG/ACT nasal spray Place 1 spray into both nostrils 2 (two) times daily as needed for allergies or rhinitis. 16 g 5  . ibuprofen (ADVIL,MOTRIN) 800 MG tablet Take 1 tablet (800 mg total) by mouth every 8 (eight) hours as needed. 30 tablet 0  . indapamide (LOZOL) 1.25 MG tablet Take 1.25 mg by mouth daily.    Marland Kitchen NIFEdipine (PROCARDIA XL/NIFEDICAL XL) 60 MG 24 hr tablet Take 1 tablet (60 mg total) by mouth daily.    Marland Kitchen olopatadine (PATANOL) 0.1 % ophthalmic solution INSTILL 1 DROP INTO BOTH EYES TWICE A DAY  3  . Prenatal Vit-Fe Fumarate-FA (PRENATAL MULTIVITAMIN) TABS tablet Take 1 tablet by mouth daily at 12 noon.    . rizatriptan (MAXALT) 10 MG tablet Take 10 mg by mouth as needed.     . valACYclovir (VALTREX) 1000 MG tablet Take 1  tablet (1,000 mg total) by mouth daily. 90 tablet 4  . VITAMIN D PO Take by mouth.    . lisdexamfetamine (VYVANSE) 40 MG capsule Take 40 mg by mouth every morning. (Patient not taking: Reported on 11/19/2019)     No current facility-administered medications on file prior to visit.    ALLERGIES: Ace inhibitors, Adhesive [tape], and Latex  Family History  Problem Relation Age of Onset  . Diabetes Mother   . Hyperlipidemia Mother   . Anemia Mother   . Diabetes Maternal Grandmother   . Heart failure Maternal Grandmother   . Diabetes Paternal Grandmother   . Stroke Paternal Grandmother   . Renal Disease Paternal Grandfather   . Heart disease Paternal Grandfather   . Stroke  Paternal Grandfather   . Hyperlipidemia Father   . Hypertension Father   . Heart disease Father   . Heart disease Brother   . Prostate cancer Maternal Grandfather   . Hypertension Sister   . Hypertension Sister   . Thyroid disease Sister   . Anemia Maternal Aunt   . Prostate cancer Paternal Uncle     SH:  Married, non smoker  Review of Systems  Constitutional:       Hot flashes  Genitourinary:       Pain with intercourse, amenorrhea    PHYSICAL EXAMINATION:    BP 118/80   Pulse 70   Temp 97.7 F (36.5 C) (Skin)   Resp 16   Wt 165 lb (74.8 kg)   LMP 09/28/2019 (Exact Date)   BMI 29.00 kg/m     General appearance: alert, cooperative and appears stated age Lymph:  no inguinal LAD noted  Pelvic: External genitalia:  no lesions              Urethra:  normal appearing urethra with no masses, tenderness or lesions              Bartholins and Skenes: normal                 Vagina: normal appearing vagina with normal color and discharge, no lesions              Cervix: no lesions and cervical mucous was noted              Bimanual Exam:  Uterus:  normal size, contour, position, consistency, mobility, non-tender              Adnexa: no mass, fullness, tenderness  Chaperone, Terence Lux, CMA, was present for exam.  Assessment: Skipped cycles H/o low AMH Vaginal discharge  Plan: TSH, estradiol, FSH, HCG and prolactin Affirm swab obtained as well.

## 2019-11-17 NOTE — Telephone Encounter (Signed)
Patient is calling in regards to missed menses and is experiencing other symptoms.

## 2019-11-19 ENCOUNTER — Ambulatory Visit (INDEPENDENT_AMBULATORY_CARE_PROVIDER_SITE_OTHER): Payer: BC Managed Care – PPO | Admitting: Obstetrics & Gynecology

## 2019-11-19 ENCOUNTER — Other Ambulatory Visit: Payer: Self-pay

## 2019-11-19 ENCOUNTER — Encounter: Payer: Self-pay | Admitting: Obstetrics & Gynecology

## 2019-11-19 VITALS — BP 118/80 | HR 70 | Temp 97.7°F | Resp 16 | Wt 165.0 lb

## 2019-11-19 DIAGNOSIS — N912 Amenorrhea, unspecified: Secondary | ICD-10-CM

## 2019-11-19 DIAGNOSIS — N898 Other specified noninflammatory disorders of vagina: Secondary | ICD-10-CM | POA: Diagnosis not present

## 2019-11-19 LAB — POCT URINE PREGNANCY: Preg Test, Ur: NEGATIVE

## 2019-11-20 LAB — BETA HCG QUANT (REF LAB): hCG Quant: <1 m[IU]/mL

## 2019-11-20 LAB — TSH: TSH: 1.06 u[IU]/mL (ref 0.450–4.500)

## 2019-11-20 LAB — FOLLICLE STIMULATING HORMONE: FSH: 64.4 m[IU]/mL

## 2019-11-20 LAB — VAGINITIS/VAGINOSIS, DNA PROBE
Candida Species: NEGATIVE
Gardnerella vaginalis: NEGATIVE
Trichomonas vaginosis: NEGATIVE

## 2019-11-20 LAB — PROLACTIN: Prolactin: 9.3 ng/mL (ref 4.8–23.3)

## 2019-11-20 LAB — ESTRADIOL: Estradiol: 68.8 pg/mL

## 2019-11-23 ENCOUNTER — Encounter: Payer: Self-pay | Admitting: Obstetrics & Gynecology

## 2019-12-08 DIAGNOSIS — H1013 Acute atopic conjunctivitis, bilateral: Secondary | ICD-10-CM | POA: Diagnosis not present

## 2019-12-08 DIAGNOSIS — H538 Other visual disturbances: Secondary | ICD-10-CM | POA: Diagnosis not present

## 2019-12-08 DIAGNOSIS — H5213 Myopia, bilateral: Secondary | ICD-10-CM | POA: Diagnosis not present

## 2019-12-10 DIAGNOSIS — I10 Essential (primary) hypertension: Secondary | ICD-10-CM | POA: Diagnosis not present

## 2019-12-10 DIAGNOSIS — J399 Disease of upper respiratory tract, unspecified: Secondary | ICD-10-CM | POA: Diagnosis not present

## 2019-12-10 DIAGNOSIS — E039 Hypothyroidism, unspecified: Secondary | ICD-10-CM | POA: Diagnosis not present

## 2019-12-10 DIAGNOSIS — E785 Hyperlipidemia, unspecified: Secondary | ICD-10-CM | POA: Diagnosis not present

## 2019-12-28 DIAGNOSIS — Z20822 Contact with and (suspected) exposure to covid-19: Secondary | ICD-10-CM | POA: Diagnosis not present

## 2019-12-31 ENCOUNTER — Telehealth: Payer: Self-pay

## 2019-12-31 NOTE — Telephone Encounter (Signed)
I would recommend trying to trigger a cycle since she is having intermittent cramping.  Should take UPT and then start provera 10mg  x 10d.  Bleeding typically starts after finishing the provera.  Should call if UPT is positive.

## 2019-12-31 NOTE — Telephone Encounter (Signed)
Patient is calling regards not having her menstrual cycle yet.

## 2019-12-31 NOTE — Telephone Encounter (Signed)
[name] 06/04/19 H/O amenorrhea Neg UPT on 11/19/19 AMH-0.070 Estradiol-68.8 KNL-97.6  Spoke with pt. Pt states not having cycle for 3 months now and wanted to give this update from 10/2019 to Dr Sabra Heck. Pt states having intermittent abd cramps and clear/sticky vaginal discharge, some breast tenderness, but no vaginal bleeding each month for the past 3 months around time she would have cycle. Pt denies vaginal itching, or odor.  Pt states has decided not to have fertility consult and does not want to use donor egg.  Pt denies taking updated UPT.  Advised will review with Dr Sabra Heck and return call with recommendations/advice. Pt agreeable.   Routing to Dr Sabra Heck.

## 2020-01-01 MED ORDER — MEDROXYPROGESTERONE ACETATE 10 MG PO TABS
10.0000 mg | ORAL_TABLET | Freq: Every day | ORAL | 0 refills | Status: DC
Start: 2020-01-01 — End: 2020-05-30

## 2020-01-01 NOTE — Telephone Encounter (Signed)
Spoke with pt. Pt given update and recommendations per Dr Sabra Heck. Pt agreeable and verbalized understanding. Pt states will take home UPT and start Provera challenge. Pt advised to call with +UPT and do not take Provera. Pt agreeable.  Pt states going out of town this weekend and will start taking Provera Rx on Sunday 01/03/20. Rx Provera 10mg , # 10, 0RF sent to pharmacy on file.  Encounter closed.

## 2020-01-05 ENCOUNTER — Telehealth: Payer: Self-pay | Admitting: Obstetrics & Gynecology

## 2020-01-05 NOTE — Telephone Encounter (Signed)
No cycle in 3 months- see phone encounter dated 12/31/19. H/O Amenorrhea AMH 0.070 Estradiol-68.8 FSH-64.4 LMP 09/28/19 H/O miscarriage 2018  Spoke with pt. Pt states having left side pain/tenderness that she noticed yesterday. States only feels pain when its being pressed on, noticed while doing dishes and pressing against counter. Unsure of any injury to this area. Pt rates tenderness as 2-3 on scale. Pt has not taken any OTC meds for pain. Denies fever, chills, nausea or vomiting. Having some intermittent cramps, denies any vaginal bleeding or spotting.  From last phone encounter dated 12/31/19, pt was suppose to start taking Provera challenge this past Sunday after having negative UPT. Pt states has not taken UPT and has not started Provera Rx.  Pt advised to take UPT today and call with any + results as Dr Sabra Heck had instructed. Pt agreeable. Pt advised if negative UPT, then to start taking Provera Rx and if pain on left side continues or gets worse to return call to office for OV or be seen in ER. Pt agreeable to plan of care.   Routing to Dr Talbert Nan for any additional recommendations/ advice.

## 2020-01-05 NOTE — Telephone Encounter (Signed)
Left detailed message with update from Dr Talbert Nan. Pt to return call if any questions or concerns.

## 2020-01-05 NOTE — Telephone Encounter (Signed)
I agree

## 2020-01-05 NOTE — Telephone Encounter (Signed)
Patient is having left pelvic tenderness.

## 2020-01-06 NOTE — Telephone Encounter (Signed)
Spoke with pt. Pt states did not get VM message left on 01/05/20. Pt given update from Dr Talbert Nan. Pt agreeable. Pt states did take UPT yesterday and was negative.Pt states did start taking the 10 mg Provera on 8/10 pm. Pt understands Provera challenge and will give a call for update if bleeding occurs or not by 01/19/20. Pt agreeable.  Encounter closed Cc: Dr Sabra Heck for review

## 2020-01-21 ENCOUNTER — Telehealth: Payer: Self-pay | Admitting: Obstetrics & Gynecology

## 2020-01-21 NOTE — Telephone Encounter (Signed)
Patient started spotting Tuesday night with bad cramps.

## 2020-01-21 NOTE — Telephone Encounter (Signed)
This counts as bleeding from the provera.  She should keep monitoring and if does not have bleeding after another 3 months, should call for provera again.  She does not want to be on any form of contraception.

## 2020-01-21 NOTE — Telephone Encounter (Signed)
Left message for pt to return call to triage RN. 

## 2020-01-21 NOTE — Telephone Encounter (Signed)
H/O miscarriage x 2 (12/17 and 7/19) with lower AMH HSV hx H/o migraines Hypertension  Spoke with pt. Pt calling to give update to Dr Sabra Heck. Pt states after finishing Provera Rx on 01/14/20, pt started having light brown spotting on 01/19/20. Pt only changing panty liner 2-3 times a day. Denies any heavy bleeding or clots. Pt states is having cramping and breast tenderness. Denies taking any OTC meds for pain. Pt advised can take Ibuprofen to help and normal results with taking Provera. Pt verbalized understanding.  Pt advised will give update to Dr Sabra Heck and return call with recommendations/advice. Pt agreeable.   Routing to Dr Sabra Heck.

## 2020-01-22 NOTE — Telephone Encounter (Signed)
Spoke with pt. Pt given update and recommendations per Dr Sabra Heck. Pt agreeable and will monitor cycles. Will return call to office if no cycle in 3 months for another Provera Rx. Pt verbalized understanding. Pt states today bleeding since taking Provera is more and having headaches and cramps. Pt advised can take Ibuprofen or tylenol as needed. Pt agreeable.  Encounter closed.

## 2020-02-04 DIAGNOSIS — K625 Hemorrhage of anus and rectum: Secondary | ICD-10-CM | POA: Diagnosis not present

## 2020-02-04 DIAGNOSIS — K219 Gastro-esophageal reflux disease without esophagitis: Secondary | ICD-10-CM | POA: Diagnosis not present

## 2020-02-04 DIAGNOSIS — K6289 Other specified diseases of anus and rectum: Secondary | ICD-10-CM | POA: Diagnosis not present

## 2020-03-03 DIAGNOSIS — K5900 Constipation, unspecified: Secondary | ICD-10-CM | POA: Diagnosis not present

## 2020-03-03 DIAGNOSIS — K219 Gastro-esophageal reflux disease without esophagitis: Secondary | ICD-10-CM | POA: Diagnosis not present

## 2020-03-03 DIAGNOSIS — K625 Hemorrhage of anus and rectum: Secondary | ICD-10-CM | POA: Diagnosis not present

## 2020-03-03 DIAGNOSIS — R152 Fecal urgency: Secondary | ICD-10-CM | POA: Diagnosis not present

## 2020-04-18 HISTORY — PX: OTHER SURGICAL HISTORY: SHX169

## 2020-04-19 DIAGNOSIS — K621 Rectal polyp: Secondary | ICD-10-CM | POA: Diagnosis not present

## 2020-04-19 DIAGNOSIS — K635 Polyp of colon: Secondary | ICD-10-CM | POA: Diagnosis not present

## 2020-04-19 DIAGNOSIS — K449 Diaphragmatic hernia without obstruction or gangrene: Secondary | ICD-10-CM | POA: Diagnosis not present

## 2020-04-19 DIAGNOSIS — K625 Hemorrhage of anus and rectum: Secondary | ICD-10-CM | POA: Diagnosis not present

## 2020-04-19 DIAGNOSIS — K219 Gastro-esophageal reflux disease without esophagitis: Secondary | ICD-10-CM | POA: Diagnosis not present

## 2020-04-26 DIAGNOSIS — D126 Benign neoplasm of colon, unspecified: Secondary | ICD-10-CM | POA: Insufficient documentation

## 2020-05-10 ENCOUNTER — Ambulatory Visit (INDEPENDENT_AMBULATORY_CARE_PROVIDER_SITE_OTHER): Payer: Medicaid Other | Admitting: Obstetrics & Gynecology

## 2020-05-10 ENCOUNTER — Other Ambulatory Visit: Payer: Self-pay

## 2020-05-10 ENCOUNTER — Encounter: Payer: Self-pay | Admitting: Obstetrics & Gynecology

## 2020-05-10 VITALS — BP 116/74 | HR 96 | Ht 63.0 in | Wt 166.0 lb

## 2020-05-10 DIAGNOSIS — N912 Amenorrhea, unspecified: Secondary | ICD-10-CM | POA: Diagnosis not present

## 2020-05-10 DIAGNOSIS — Z8759 Personal history of other complications of pregnancy, childbirth and the puerperium: Secondary | ICD-10-CM | POA: Diagnosis not present

## 2020-05-10 DIAGNOSIS — Z3201 Encounter for pregnancy test, result positive: Secondary | ICD-10-CM

## 2020-05-10 NOTE — Progress Notes (Signed)
GYNECOLOGY  VISIT  CC:   amenorrhea  HPI: 44 y.o. G26P1011 Married Denise [name] here for positive UPT at home.  LMP 04/08/2020.  She had two cycles in October.  Blood type 05/13/2017.  Pt has hx of irregular cycle and low AMH.  Last tested 05/2019 and was 0.070.  Wishram and estradiol were 64 and 68, respectively in 10/2019.  Pt and I have discussed possible donor egg.  She has not wanted to pursue this and only desires natural pregnancy.  They have not been actively trying or preventing.  Not on PNV.  Took UPT at home that became positive quickly.  Denies pelvic pain, nausea or other symptoms.  Pt is 4 4/7 weeks by LMP.  GYNECOLOGIC HISTORY: Patient's last menstrual period was 04/08/2020. Contraception: none   Patient Active Problem List   Diagnosis Date Noted  . Perennial allergic rhinitis 04/10/2018  . Chronic urticaria 04/10/2018  . 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 Essential hypertension, benign 02/24/2016  . PIH (pregnancy induced hypertension) 08/29/2013  . Murmur 06/26/2013  . Palpitations 06/26/2013  . IBS (irritable bowel syndrome)   . Migraine   . GERD (gastroesophageal reflux disease)   . HSV-2 infection   . IC (interstitial cystitis)     Past Medical History:  Diagnosis Date  . Angio-edema   . GERD (gastroesophageal reflux disease)   . HSV-2 infection    2008  . IBS (irritable bowel syndrome)   . IC (interstitial cystitis)    2008  . Migraine   . Pregnancy induced hypertension   . Urticaria     Past Surgical History:  Procedure Laterality Date  . ADENOIDECTOMY    . BREAST SURGERY  2002   right breast biopsy Benign  . CESAREAN SECTION N/A 08/30/2013   Procedure: CESAREAN SECTION;  Surgeon: Marvene Staff, MD;  Location: [facility];  Service: Obstetrics;  Laterality: N/A;  . KNEE ARTHROSCOPY  3/11, 2012   right knee  . SHOULDER ARTHROSCOPY    . TONSILLECTOMY    . WISDOM TOOTH EXTRACTION      MEDS:   Current Outpatient Medications on File  Prior to Visit  Medication Sig Dispense Refill  . cetirizine (ZYRTEC ALLERGY) 10 MG tablet Take 1 tablet (10 mg total) by mouth 2 (two) times daily. 60 tablet 5  . fluticasone (FLONASE) 50 MCG/ACT nasal spray Place 1 spray into both nostrils 2 (two) times daily as needed for allergies or rhinitis. 16 g 5  . NIFEdipine (PROCARDIA XL/NIFEDICAL XL) 60 MG 24 hr tablet Take 1 tablet (60 mg total) by mouth daily.    Marland Kitchen olopatadine (PATANOL) 0.1 % ophthalmic solution INSTILL 1 DROP INTO BOTH EYES TWICE A DAY  3  . Prenatal Vit-Fe Fumarate-FA (PRENATAL MULTIVITAMIN) TABS tablet Take 1 tablet by mouth daily at 12 noon.    . valACYclovir (VALTREX) 1000 MG tablet Take 1 tablet (1,000 mg total) by mouth daily. 90 tablet 4  . Bempedoic Acid (NEXLETOL) 180 MG TABS TAKE 1 TABLET BY MOUTH EVERY DAY NOT COVERED (Patient not taking: Reported on 05/10/2020)    . ibuprofen (ADVIL,MOTRIN) 800 MG tablet Take 1 tablet (800 mg total) by mouth every 8 (eight) hours as needed. (Patient not taking: Reported on 05/10/2020) 30 tablet 0  . indapamide (LOZOL) 1.25 MG tablet Take 1.25 mg by mouth daily. (Patient not taking: Reported on 05/10/2020)    . lisdexamfetamine (VYVANSE) 40 MG capsule Take 40 mg by mouth every morning. (Patient not taking: Reported on  05/10/2020)    . medroxyPROGESTERone (PROVERA) 10 MG tablet Take 1 tablet (10 mg total) by mouth daily. (Patient not taking: Reported on 05/10/2020) 10 tablet 0  . rizatriptan (MAXALT) 10 MG tablet Take 10 mg by mouth as needed.  (Patient not taking: Reported on 05/10/2020)    . VITAMIN D PO Take by mouth. (Patient not taking: Reported on 05/10/2020)     No current facility-administered medications on file prior to visit.    ALLERGIES: Ace inhibitors, Adhesive [tape], and Latex  Family History  Problem Relation Age of Onset  . Diabetes Mother   . Hyperlipidemia Mother   . Anemia Mother   . Diabetes Maternal Grandmother   . Heart failure Maternal Grandmother   .  Diabetes Paternal Grandmother   . Stroke Paternal Grandmother   . Renal Disease Paternal Grandfather   . Heart disease Paternal Grandfather   . Stroke Paternal Grandfather   . Hyperlipidemia Father   . Hypertension Father   . Heart disease Father   . Heart disease Brother   . Prostate cancer Maternal Grandfather   . Hypertension Sister   . Hypertension Sister   . Thyroid disease Sister   . Anemia Maternal Aunt   . Prostate cancer Paternal Uncle     SH:  Married, non smoker  Review of Systems  All other systems reviewed and are negative.   PHYSICAL EXAMINATION:    BP 116/74   Pulse 96   Ht 5\' 3"  (1.6 m)   Wt 166 lb (75.3 kg)   LMP 04/08/2020   BMI 29.41 kg/m     General appearance: alert, cooperative and appears stated age No other physical exam performed  Assessment/Plan:  1. Amenorrhea - Serum HCG obtained today. Will need to repeat in 2 days. - If rising appropriately, would plan viability PUS around 7 weeks    . History of miscarriage - Pt aware this risk is elevated given her prior AMH and FSH levels.  MBT O+   22 minutes of total time was spent for this patient encounter, including preparation, face-to-face counseling with the patient and coordination of care, and documentation of the encounter.

## 2020-05-11 ENCOUNTER — Telehealth: Payer: Self-pay

## 2020-05-11 LAB — HCG, SERUM, QUALITATIVE: hCG,Beta Subunit,Qual,Serum: POSITIVE m[IU]/mL — AB (ref <6)

## 2020-05-11 NOTE — Telephone Encounter (Signed)
Called [facility] and added on HCG quantitative lab to yesterdays blood work. Kathrene Alu RN

## 2020-05-12 ENCOUNTER — Other Ambulatory Visit: Payer: Medicaid Other

## 2020-05-12 ENCOUNTER — Other Ambulatory Visit: Payer: Self-pay

## 2020-05-12 DIAGNOSIS — Z3201 Encounter for pregnancy test, result positive: Secondary | ICD-10-CM

## 2020-05-12 LAB — SPECIMEN STATUS REPORT

## 2020-05-12 LAB — BETA HCG QUANT (REF LAB): hCG Quant: 222 m[IU]/mL

## 2020-05-13 ENCOUNTER — Other Ambulatory Visit: Payer: Self-pay | Admitting: Obstetrics & Gynecology

## 2020-05-13 ENCOUNTER — Telehealth: Payer: Self-pay | Admitting: Radiology

## 2020-05-13 DIAGNOSIS — Z8759 Personal history of other complications of pregnancy, childbirth and the puerperium: Secondary | ICD-10-CM

## 2020-05-13 LAB — BETA HCG QUANT (REF LAB): hCG Quant: 554 m[IU]/mL

## 2020-05-13 NOTE — Telephone Encounter (Signed)
Call and left message for patient to call [facility] to schedule HCG lab on 04/2020 per Dr Ammie Ferrier request

## 2020-05-16 ENCOUNTER — Other Ambulatory Visit: Payer: Self-pay

## 2020-05-16 ENCOUNTER — Other Ambulatory Visit: Payer: Medicaid Other

## 2020-05-16 DIAGNOSIS — Z8759 Personal history of other complications of pregnancy, childbirth and the puerperium: Secondary | ICD-10-CM

## 2020-05-17 LAB — BETA HCG QUANT (REF LAB): hCG Quant: 2597 m[IU]/mL

## 2020-05-30 ENCOUNTER — Ambulatory Visit (INDEPENDENT_AMBULATORY_CARE_PROVIDER_SITE_OTHER): Payer: BC Managed Care – PPO | Admitting: Obstetrics & Gynecology

## 2020-05-30 ENCOUNTER — Other Ambulatory Visit: Payer: Self-pay

## 2020-05-30 DIAGNOSIS — Z8759 Personal history of other complications of pregnancy, childbirth and the puerperium: Secondary | ICD-10-CM | POA: Diagnosis not present

## 2020-05-30 DIAGNOSIS — Z3201 Encounter for pregnancy test, result positive: Secondary | ICD-10-CM

## 2020-05-30 DIAGNOSIS — I10 Essential (primary) hypertension: Secondary | ICD-10-CM

## 2020-05-30 NOTE — Progress Notes (Signed)
GYNECOLOGY  VISIT  CC:   amenorrhea  HPI: 45 y.o. 262P1011 [place]ried [place] female here for early viability scan.  H/o miscarriage with prior pregnancy.  Has been on baby ASA for possible risk reduction of miscarriage.  HCG levels have been going up appropriate.  Having some mild nausea.  Also having breast tenderness.  Denies pelvic pain or vaginal bleeding.  LMP 04/17/2020 but pt has hx of irregular cycles and had two menstrual cycles in October.   Patient Active Problem List   Diagnosis Date Noted  . Perennial allergic rhinitis 04/10/2018  . Chronic urticaria 04/10/2018  . 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 Essential hypertension, benign 02/24/2016  . PIH (pregnancy induced hypertension) 08/29/2013  . Murmur 06/26/2013  . Palpitations 06/26/2013  . IBS (irritable bowel syndrome)   . Migraine   . GERD (gastroesophageal reflux disease)   . HSV-2 infection   . IC (interstitial cystitis)     Past Medical History:  Diagnosis Date  . Angio-edema   . GERD (gastroesophageal reflux disease)   . HSV-2 infection    2008  . IBS (irritable bowel syndrome)   . IC (interstitial cystitis)    2008  . Migraine   . Pregnancy induced hypertension   . Urticaria     Past Surgical History:  Procedure Laterality Date  . ADENOIDECTOMY    . BREAST SURGERY  2002   right breast biopsy Benign  . CESAREAN SECTION N/A 08/30/2013   Procedure: CESAREAN SECTION;  Surgeon: Serita KyleSheronette A Cousins, MD;  Location: [facility];  Service: Obstetrics;  Laterality: N/A;  . KNEE ARTHROSCOPY  3/11, 2012   right knee  . SHOULDER ARTHROSCOPY    . TONSILLECTOMY    . WISDOM TOOTH EXTRACTION      MEDS:   Current Outpatient Medications on File Prior to Visit  Medication Sig Dispense Refill  . cetirizine (ZYRTEC ALLERGY) 10 MG tablet Take 1 tablet (10 mg total) by mouth 2 (two) times daily. 60 tablet 5  . fluticasone (FLONASE) 50 MCG/ACT nasal spray Place 1 spray into both nostrils 2 (two) times daily as  needed for allergies or rhinitis. 16 g 5  . NIFEdipine (PROCARDIA XL/NIFEDICAL XL) 60 MG 24 hr tablet Take 1 tablet (60 mg total) by mouth daily.    Marland Kitchen. olopatadine (PATANOL) 0.1 % ophthalmic solution INSTILL 1 DROP INTO BOTH EYES TWICE A DAY  3  . Prenatal Vit-Fe Fumarate-FA (PRENATAL MULTIVITAMIN) TABS tablet Take 1 tablet by mouth daily at 12 noon.    . valACYclovir (VALTREX) 1000 MG tablet Take 1 tablet (1,000 mg total) by mouth daily. 90 tablet 4   No current facility-administered medications on file prior to visit.    ALLERGIES: Ace inhibitors, Adhesive [tape], and Latex  Family History  Problem Relation Age of Onset  . Diabetes Mother   . Hyperlipidemia Mother   . Anemia Mother   . Diabetes Maternal Grandmother   . Heart failure Maternal Grandmother   . Diabetes Paternal Grandmother   . Stroke Paternal Grandmother   . Renal Disease Paternal Grandfather   . Heart disease Paternal Grandfather   . Stroke Paternal Grandfather   . Hyperlipidemia Father   . Hypertension Father   . Heart disease Father   . Heart disease Brother   . Prostate cancer Maternal Grandfather   . Hypertension Sister   . Hypertension Sister   . Thyroid disease Sister   . Anemia Maternal Aunt   . Prostate cancer Paternal Uncle  SH:  Married, non smoker  Review of Systems  Gastrointestinal: Positive for nausea.  Endocrine:       Breast tenderness    PHYSICAL EXAMINATION:    BP: 137/82 P: 94 Ht: 5'3" Wt: 170.4#  General appearance: alert, cooperative and appears stated age  Bedside ultrasound performed: Single IUP noted with centrally located gestational sac.  FCA at 138 BPM noted.  CRL 0.43cm which is 6 1/[redacted] weeks gestation.  Medications reviewed and multiple removed as she is not taking them or advised not to take them.  Options for obstetrical care reviewed.  She is interested in attempting VBAC as well.  Chaperone was present for exam.  Assessment/Plan: 1. Positive pregnancy  test -bedside viability scan performed today.  Mason Jim IUD noted with EGA based on CRL 6 1/7 weeks. -return 3 weeks for new ob appt and for repeat scanning for dating/viability.  2. History of miscarriage -Continue 81mg  ASA.  Advised to just stay on this due to chronic hypertension  3. Chronic hypertension -Pt will continue nifedipine XL.  Has stopped diuretic.    4.  H/O Vulvar HSV - continue to use valtrex prn for symptoms.  Aware will be on this for last 4 weeks of pregnancy  5.  H/o cesarean section - would like to consider VBAC    24 minutes of total time was spent for this patient encounter, including preparation, face-to-face counseling with the patient and coordination of care, and documentation of the encounter.

## 2020-05-30 NOTE — Progress Notes (Signed)
137/82 94 170.4lb 5'3

## 2020-06-14 ENCOUNTER — Other Ambulatory Visit: Payer: Self-pay | Admitting: Obstetrics & Gynecology

## 2020-06-14 MED ORDER — NIFEDIPINE ER OSMOTIC RELEASE 60 MG PO TB24: 60.0000 mg | ORAL_TABLET | Freq: Every day | ORAL | 11 refills | Status: DC

## 2020-06-20 ENCOUNTER — Encounter: Payer: Self-pay | Admitting: Obstetrics & Gynecology

## 2020-06-20 ENCOUNTER — Other Ambulatory Visit: Payer: Self-pay

## 2020-06-20 ENCOUNTER — Ambulatory Visit (INDEPENDENT_AMBULATORY_CARE_PROVIDER_SITE_OTHER): Payer: BC Managed Care – PPO | Admitting: Obstetrics & Gynecology

## 2020-06-20 VITALS — BP 122/79 | HR 89

## 2020-06-20 DIAGNOSIS — O021 Missed abortion: Secondary | ICD-10-CM | POA: Diagnosis not present

## 2020-06-20 NOTE — Progress Notes (Signed)
GYNECOLOGY  VISIT  CC:   recheck  HPI: 45 y.o. G76P1011 Married Denise [name] or [place] American female here for recheck in early pregnancy.  Pt is 10 3/7 by LMP and 9 1/7 by ultrasound on 05/30/2020.  Pt is still having some nausea and breast tenderness.  Denies vaginal bleeding or cramping.    Bedside ultrasound today does not show any FCA and CRL measurements are c/w EGA 7 6/7 and 8 1/7 weeks.  Findings discussed with pt.  She is obviously upset.  Options discussed with pt including repeating ultrasound with formal ultrasound in radiology, cytotec, watchful waiting for miscarriage or D&C.  She would like confirmation ultrasound but then will most likely desire D&C.  Also, additional evaluation for recurrent miscarriage briefly discussed.  She does desire chromosomal analysis with products of conception as well.  MBT O+  GYNECOLOGIC HISTORY: Patient's last menstrual period was 04/08/2020.  Patient Active Problem List   Diagnosis Date Noted  . Perennial allergic rhinitis 04/10/2018  . Chronic urticaria 04/10/2018  . 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 Essential hypertension, benign 02/24/2016  . PIH (pregnancy induced hypertension) 08/29/2013  . Murmur 06/26/2013  . Palpitations 06/26/2013  . IBS (irritable bowel syndrome)   . Migraine   . GERD (gastroesophageal reflux disease)   . HSV-2 infection   . IC (interstitial cystitis)     Past Medical History:  Diagnosis Date  . Angio-edema   . GERD (gastroesophageal reflux disease)   . HSV-2 infection    2008  . IBS (irritable bowel syndrome)   . IC (interstitial cystitis)    2008  . Migraine   . Pregnancy induced hypertension   . Urticaria     Past Surgical History:  Procedure Laterality Date  . ADENOIDECTOMY    . BREAST SURGERY  2002   right breast biopsy Benign  . CESAREAN SECTION N/A 08/30/2013   Procedure: CESAREAN SECTION;  Surgeon: Marvene Staff, MD;  Location: [facility];  Service: Obstetrics;  Laterality: N/A;  . KNEE  ARTHROSCOPY  3/11, 2012   right knee  . SHOULDER ARTHROSCOPY    . TONSILLECTOMY    . WISDOM TOOTH EXTRACTION      MEDS:   Current Outpatient Medications on File Prior to Visit  Medication Sig Dispense Refill  . cetirizine (ZYRTEC ALLERGY) 10 MG tablet Take 1 tablet (10 mg total) by mouth 2 (two) times daily. 60 tablet 5  . fluticasone (FLONASE) 50 MCG/ACT nasal spray Place 1 spray into both nostrils 2 (two) times daily as needed for allergies or rhinitis. 16 g 5  . NIFEdipine (PROCARDIA XL/NIFEDICAL XL) 60 MG 24 hr tablet Take 1 tablet (60 mg total) by mouth daily. 30 tablet 11  . olopatadine (PATANOL) 0.1 % ophthalmic solution INSTILL 1 DROP INTO BOTH EYES TWICE A DAY  3  . Prenatal Vit-Fe Fumarate-FA (PRENATAL MULTIVITAMIN) TABS tablet Take 1 tablet by mouth daily at 12 noon.    . valACYclovir (VALTREX) 1000 MG tablet Take 1 tablet (1,000 mg total) by mouth daily. 90 tablet 4   No current facility-administered medications on file prior to visit.    ALLERGIES: Ace inhibitors, Adhesive [tape], and Latex  Family History  Problem Relation Age of Onset  . Diabetes Mother   . Hyperlipidemia Mother   . Anemia Mother   . Diabetes Maternal Grandmother   . Heart failure Maternal Grandmother   . Diabetes Paternal Grandmother   . Stroke Paternal Grandmother   . Renal Disease Paternal Grandfather   .  Heart disease Paternal Grandfather   . Stroke Paternal Grandfather   . Hyperlipidemia Father   . Hypertension Father   . Heart disease Father   . Heart disease Brother   . Prostate cancer Maternal Grandfather   . Hypertension Sister   . Hypertension Sister   . Thyroid disease Sister   . Anemia Maternal Aunt   . Prostate cancer Paternal Uncle     SH:  Married, non smoker  Review of Systems  All other systems reviewed and are negative.   PHYSICAL EXAMINATION:    BP 122/79   Pulse 89   LMP 04/08/2020     General appearance: alert, cooperative and appears stated age No other  physical exam was performed  CRL measurements are between 1.49 - 1.59cm.    Chaperone, Edwena Blow, CMA, was present for exam.  Assessment/Plan:  1. Missed abortion with fetal demise before 20 completed weeks of gestation - US OB Comp Less 14 Wks; Future - if confirmed, we will plan with suction D&C per pt desires  20 minutes of total time was spent for this patient encounter, including preparation, face-to-face counseling with the patient and coordination of care, and documentation of the encounter.

## 2020-06-21 ENCOUNTER — Telehealth: Payer: Self-pay

## 2020-06-21 ENCOUNTER — Other Ambulatory Visit: Payer: Self-pay | Admitting: Obstetrics & Gynecology

## 2020-06-21 ENCOUNTER — Encounter (HOSPITAL_BASED_OUTPATIENT_CLINIC_OR_DEPARTMENT_OTHER): Payer: Self-pay | Admitting: Obstetrics & Gynecology

## 2020-06-21 ENCOUNTER — Other Ambulatory Visit: Payer: Self-pay

## 2020-06-21 ENCOUNTER — Ambulatory Visit
Admission: RE | Admit: 2020-06-21 | Discharge: 2020-06-21 | Disposition: A | Discharge status: Home / Self Care | Payer: BC Managed Care – PPO | Source: Ambulatory Visit | Attending: Obstetrics & Gynecology | Admitting: Obstetrics & Gynecology

## 2020-06-21 DIAGNOSIS — O021 Missed abortion: Secondary | ICD-10-CM | POA: Diagnosis not present

## 2020-06-21 DIAGNOSIS — Z3A01 Less than 8 weeks gestation of pregnancy: Secondary | ICD-10-CM | POA: Diagnosis not present

## 2020-06-21 NOTE — Telephone Encounter (Signed)
-----   Message from Megan Salon, MD sent at 06/21/2020  4:12 PM EST ----- Regarding: suction D&E Drema Balzarine, This is the patient with the missed abortion.  She is Rh + and does not need rhogam.    Thank you.  Vinnie Level

## 2020-06-21 NOTE — Telephone Encounter (Signed)
Called patient, surgery date and time and pre-op instructions given. Patient expressed understanding. Advised her to give the office a call back if the need arises.

## 2020-06-21 NOTE — Progress Notes (Signed)
Spoke w/ via phone for pre-op interview---pt Lab needs dos----     Cbc I stat T & S ekg           Lab results------none COVID test ------06-22-2020 830 am Arrive at -------530 am 06-23-2020 NPO after MN NO Solid Food.  Clear liquids from MN until---430 am then npo Medications to take morning of surgery -----procardia, famotidine Diabetic medication -----n/a Patient Special Instructions -----none Pre-Op special Istructions -----none Patient verbalized understanding of instructions that were given at this phone interview. Patient denies shortness of breath, chest pain, fever, cough at this phone interview.

## 2020-06-22 ENCOUNTER — Other Ambulatory Visit (HOSPITAL_COMMUNITY)
Admission: RE | Admit: 2020-06-22 | Discharge: 2020-06-22 | Disposition: A | Discharge status: Home / Self Care | Payer: BC Managed Care – PPO | Source: Ambulatory Visit | Attending: Obstetrics & Gynecology | Admitting: Obstetrics & Gynecology

## 2020-06-22 DIAGNOSIS — Z79899 Other long term (current) drug therapy: Secondary | ICD-10-CM | POA: Diagnosis not present

## 2020-06-22 DIAGNOSIS — Z91040 Latex allergy status: Secondary | ICD-10-CM | POA: Diagnosis not present

## 2020-06-22 DIAGNOSIS — Z01812 Encounter for preprocedural laboratory examination: Secondary | ICD-10-CM | POA: Insufficient documentation

## 2020-06-22 DIAGNOSIS — Z20822 Contact with and (suspected) exposure to covid-19: Secondary | ICD-10-CM | POA: Insufficient documentation

## 2020-06-22 DIAGNOSIS — Z888 Allergy status to other drugs, medicaments and biological substances status: Secondary | ICD-10-CM | POA: Diagnosis not present

## 2020-06-22 DIAGNOSIS — O021 Missed abortion: Secondary | ICD-10-CM | POA: Diagnosis not present

## 2020-06-22 LAB — SARS CORONAVIRUS 2 (TAT 6-24 HRS): SARS Coronavirus 2: NEGATIVE

## 2020-06-22 NOTE — Anesthesia Preprocedure Evaluation (Signed)
Anesthesia Evaluation  Patient identified by MRN, date of birth, ID band Patient awake    Reviewed: Allergy & Precautions, NPO status , Patient's Chart, lab work & pertinent test results  History of Anesthesia Complications (+) PONV, Family history of anesthesia reaction and history of anesthetic complications  Airway Mallampati: I       Dental no notable dental hx.    Pulmonary neg pulmonary ROS,    Pulmonary exam normal        Cardiovascular hypertension, Pt. on medications Normal cardiovascular exam     Neuro/Psych  Headaches, negative psych ROS   GI/Hepatic Neg liver ROS, GERD  Medicated,  Endo/Other    Renal/GU negative Renal ROS  negative genitourinary   Musculoskeletal negative musculoskeletal ROS (+)   Abdominal Normal abdominal exam  (+)   Peds  Hematology   Anesthesia Other Findings   Reproductive/Obstetrics                           Anesthesia Physical Anesthesia Plan  ASA: II  Anesthesia Plan: General   Post-op Pain Management:    Induction: Intravenous  PONV Risk Score and Plan: 4 or greater and Ondansetron, Dexamethasone and Midazolam  Airway Management Planned: LMA  Additional Equipment: None  Intra-op Plan:   Post-operative Plan: Extubation in OR  Informed Consent: I have reviewed the patients History and Physical, chart, labs and discussed the procedure including the risks, benefits and alternatives for the proposed anesthesia with the patient or authorized representative who has indicated his/her understanding and acceptance.     Dental advisory given  Plan Discussed with: CRNA  Anesthesia Plan Comments:        Anesthesia Quick Evaluation

## 2020-06-23 ENCOUNTER — Ambulatory Visit (HOSPITAL_BASED_OUTPATIENT_CLINIC_OR_DEPARTMENT_OTHER)
Admission: RE | Admit: 2020-06-23 | Discharge: 2020-06-23 | Disposition: A | Discharge status: Home / Self Care | Payer: BC Managed Care – PPO | Attending: Obstetrics & Gynecology | Admitting: Obstetrics & Gynecology

## 2020-06-23 ENCOUNTER — Encounter (HOSPITAL_BASED_OUTPATIENT_CLINIC_OR_DEPARTMENT_OTHER)
Admission: RE | Disposition: A | Discharge status: Home / Self Care | Payer: Self-pay | Source: Home / Self Care | Attending: Obstetrics & Gynecology

## 2020-06-23 ENCOUNTER — Ambulatory Visit (HOSPITAL_BASED_OUTPATIENT_CLINIC_OR_DEPARTMENT_OTHER): Payer: BC Managed Care – PPO | Admitting: Anesthesiology

## 2020-06-23 ENCOUNTER — Other Ambulatory Visit (HOSPITAL_BASED_OUTPATIENT_CLINIC_OR_DEPARTMENT_OTHER): Payer: Self-pay | Admitting: Obstetrics & Gynecology

## 2020-06-23 ENCOUNTER — Encounter (HOSPITAL_BASED_OUTPATIENT_CLINIC_OR_DEPARTMENT_OTHER): Payer: Self-pay | Admitting: Obstetrics & Gynecology

## 2020-06-23 ENCOUNTER — Other Ambulatory Visit: Payer: Self-pay

## 2020-06-23 DIAGNOSIS — K219 Gastro-esophageal reflux disease without esophagitis: Secondary | ICD-10-CM | POA: Diagnosis not present

## 2020-06-23 DIAGNOSIS — Z79899 Other long term (current) drug therapy: Secondary | ICD-10-CM | POA: Diagnosis not present

## 2020-06-23 DIAGNOSIS — O021 Missed abortion: Secondary | ICD-10-CM | POA: Insufficient documentation

## 2020-06-23 DIAGNOSIS — Z20822 Contact with and (suspected) exposure to covid-19: Secondary | ICD-10-CM | POA: Insufficient documentation

## 2020-06-23 DIAGNOSIS — Z888 Allergy status to other drugs, medicaments and biological substances status: Secondary | ICD-10-CM | POA: Insufficient documentation

## 2020-06-23 DIAGNOSIS — Z91040 Latex allergy status: Secondary | ICD-10-CM | POA: Diagnosis not present

## 2020-06-23 DIAGNOSIS — K589 Irritable bowel syndrome without diarrhea: Secondary | ICD-10-CM | POA: Diagnosis not present

## 2020-06-23 DIAGNOSIS — Z3A00 Weeks of gestation of pregnancy not specified: Secondary | ICD-10-CM | POA: Diagnosis not present

## 2020-06-23 DIAGNOSIS — I10 Essential (primary) hypertension: Secondary | ICD-10-CM | POA: Diagnosis not present

## 2020-06-23 HISTORY — DX: Presence of spectacles and contact lenses: Z97.3

## 2020-06-23 HISTORY — PX: DILATION AND EVACUATION: SHX1459

## 2020-06-23 HISTORY — DX: Other complications of anesthesia, initial encounter: T88.59XA

## 2020-06-23 HISTORY — DX: Concussion with loss of consciousness status unknown, initial encounter: S06.0XAA

## 2020-06-23 HISTORY — DX: Concussion with loss of consciousness of unspecified duration, initial encounter: S06.0X9A

## 2020-06-23 HISTORY — DX: Anemia, unspecified: D64.9

## 2020-06-23 HISTORY — DX: Family history of other specified conditions: Z84.89

## 2020-06-23 LAB — CBC
HCT: 35.7 % — ABNORMAL LOW (ref 36.0–46.0)
Hemoglobin: 13 g/dL (ref 12.0–15.0)
MCH: 29.7 pg (ref 26.0–34.0)
MCHC: 36.4 g/dL — ABNORMAL HIGH (ref 30.0–36.0)
MCV: 81.5 fL (ref 80.0–100.0)
Platelets: 225 10*3/uL (ref 150–400)
RBC: 4.38 MIL/uL (ref 3.87–5.11)
RDW: 12.8 % (ref 11.5–15.5)
WBC: 4.7 10*3/uL (ref 4.0–10.5)
nRBC: 0 % (ref 0.0–0.2)

## 2020-06-23 LAB — TYPE AND SCREEN
ABO/RH(D): O POS
Antibody Screen: NEGATIVE

## 2020-06-23 SURGERY — DILATION AND EVACUATION, UTERUS
Anesthesia: General

## 2020-06-23 MED ORDER — FENTANYL CITRATE (PF) 100 MCG/2ML IJ SOLN
INTRAMUSCULAR | Status: DC | PRN
  Administered 2020-06-23: 50 ug via INTRAVENOUS
  Administered 2020-06-23 (×2): 25 ug via INTRAVENOUS

## 2020-06-23 MED ORDER — OXYCODONE HCL 5 MG PO TABS: 5.0000 mg | ORAL_TABLET | Freq: Once | ORAL | Status: DC | PRN

## 2020-06-23 MED ORDER — KETOROLAC TROMETHAMINE 30 MG/ML IJ SOLN
INTRAMUSCULAR | Status: AC
  Filled 2020-06-23: qty 1

## 2020-06-23 MED ORDER — GLYCOPYRROLATE PF 0.2 MG/ML IJ SOSY
PREFILLED_SYRINGE | INTRAMUSCULAR | Status: AC
  Filled 2020-06-23: qty 1

## 2020-06-23 MED ORDER — ACETAMINOPHEN 160 MG/5ML PO SOLN
325.0000 mg | ORAL | Status: DC | PRN
Start: 2020-06-23 — End: 2020-06-23

## 2020-06-23 MED ORDER — CARBOPROST TROMETHAMINE 250 MCG/ML IM SOLN
INTRAMUSCULAR | Status: AC
  Filled 2020-06-23: qty 1

## 2020-06-23 MED ORDER — LIDOCAINE HCL (PF) 2 % IJ SOLN
INTRAMUSCULAR | Status: AC
  Filled 2020-06-23: qty 5

## 2020-06-23 MED ORDER — MEPERIDINE HCL 25 MG/ML IJ SOLN: 6.2500 mg | INTRAMUSCULAR | Status: DC | PRN

## 2020-06-23 MED ORDER — LACTATED RINGERS IV SOLN: INTRAVENOUS | Status: DC

## 2020-06-23 MED ORDER — DEXAMETHASONE SODIUM PHOSPHATE 10 MG/ML IJ SOLN
INTRAMUSCULAR | Status: DC | PRN
  Administered 2020-06-23: 10 mg via INTRAVENOUS

## 2020-06-23 MED ORDER — ONDANSETRON HCL 4 MG/2ML IJ SOLN: 4.0000 mg | Freq: Once | INTRAMUSCULAR | Status: DC | PRN

## 2020-06-23 MED ORDER — HYDROCODONE-ACETAMINOPHEN 5-325 MG PO TABS
1.0000 | ORAL_TABLET | Freq: Four times a day (QID) | ORAL | 0 refills | Status: DC | PRN
Start: 2020-06-23 — End: 2020-06-23

## 2020-06-23 MED ORDER — DEXAMETHASONE SODIUM PHOSPHATE 10 MG/ML IJ SOLN
INTRAMUSCULAR | Status: AC
  Filled 2020-06-23: qty 1

## 2020-06-23 MED ORDER — KETOROLAC TROMETHAMINE 30 MG/ML IJ SOLN
INTRAMUSCULAR | Status: DC | PRN
  Administered 2020-06-23: 30 mg via INTRAVENOUS

## 2020-06-23 MED ORDER — ONDANSETRON HCL 4 MG/2ML IJ SOLN
INTRAMUSCULAR | Status: AC
  Filled 2020-06-23: qty 2

## 2020-06-23 MED ORDER — LIDOCAINE-EPINEPHRINE 1 %-1:100000 IJ SOLN
INTRAMUSCULAR | Status: DC | PRN
  Administered 2020-06-23: 10 mL

## 2020-06-23 MED ORDER — FENTANYL CITRATE (PF) 100 MCG/2ML IJ SOLN
INTRAMUSCULAR | Status: AC
  Filled 2020-06-23: qty 2

## 2020-06-23 MED ORDER — ONDANSETRON HCL 4 MG/2ML IJ SOLN
INTRAMUSCULAR | Status: DC | PRN
  Administered 2020-06-23: 4 mg via INTRAVENOUS

## 2020-06-23 MED ORDER — IBUPROFEN 800 MG PO TABS
800.0000 mg | ORAL_TABLET | Freq: Three times a day (TID) | ORAL | 0 refills | Status: DC | PRN
Start: 2020-06-23 — End: 2020-06-23

## 2020-06-23 MED ORDER — KETOROLAC TROMETHAMINE 15 MG/ML IJ SOLN: 15.0000 mg | Freq: Once | INTRAMUSCULAR | Status: DC

## 2020-06-23 MED ORDER — PROPOFOL 10 MG/ML IV BOLUS
INTRAVENOUS | Status: DC | PRN
  Administered 2020-06-23: 120 mg via INTRAVENOUS
  Administered 2020-06-23: 20 mg via INTRAVENOUS

## 2020-06-23 MED ORDER — ARTIFICIAL TEARS OPHTHALMIC OINT
TOPICAL_OINTMENT | OPHTHALMIC | Status: AC
  Filled 2020-06-23: qty 3.5

## 2020-06-23 MED ORDER — OXYCODONE HCL 5 MG/5ML PO SOLN: 5.0000 mg | Freq: Once | ORAL | Status: DC | PRN

## 2020-06-23 MED ORDER — MIDAZOLAM HCL 2 MG/2ML IJ SOLN
INTRAMUSCULAR | Status: AC
  Filled 2020-06-23: qty 2

## 2020-06-23 MED ORDER — ACETAMINOPHEN 325 MG PO TABS: 325.0000 mg | ORAL_TABLET | ORAL | Status: DC | PRN

## 2020-06-23 MED ORDER — POVIDONE-IODINE 10 % EX SWAB: 2.0000 "application " | Freq: Once | CUTANEOUS | Status: DC

## 2020-06-23 MED ORDER — LIDOCAINE 2% (20 MG/ML) 5 ML SYRINGE
INTRAMUSCULAR | Status: DC | PRN
  Administered 2020-06-23: 40 mg via INTRAVENOUS
  Administered 2020-06-23: 60 mg via INTRAVENOUS

## 2020-06-23 MED ORDER — METHYLERGONOVINE MALEATE 0.2 MG/ML IJ SOLN
INTRAMUSCULAR | Status: AC
  Filled 2020-06-23: qty 1

## 2020-06-23 MED ORDER — FENTANYL CITRATE (PF) 100 MCG/2ML IJ SOLN
25.0000 ug | INTRAMUSCULAR | Status: DC | PRN
  Administered 2020-06-23 (×2): 50 ug via INTRAVENOUS

## 2020-06-23 MED ORDER — PROPOFOL 10 MG/ML IV BOLUS
INTRAVENOUS | Status: AC
  Filled 2020-06-23: qty 20

## 2020-06-23 MED ORDER — GLYCOPYRROLATE PF 0.2 MG/ML IJ SOSY
PREFILLED_SYRINGE | INTRAMUSCULAR | Status: DC | PRN
  Administered 2020-06-23: .2 mg via INTRAVENOUS

## 2020-06-23 MED ORDER — MIDAZOLAM HCL 2 MG/2ML IJ SOLN
INTRAMUSCULAR | Status: DC | PRN
  Administered 2020-06-23: 2 mg via INTRAVENOUS

## 2020-06-23 MED FILL — HYDROCODON-APAP 5-325: 5-325 | 3 days supply | Qty: 12 | Fill #0

## 2020-06-23 MED FILL — IBUPROFEN 800 MG TAB: 800 | 10 days supply | Qty: 30 | Fill #0

## 2020-06-23 SURGICAL SUPPLY — 22 items
CATH ROBINSON RED A/P 16FR (CATHETERS) ×3 IMPLANT
COVER WAND RF STERILE (DRAPES) ×3 IMPLANT
DILATOR CANAL MILEX (MISCELLANEOUS) ×3 IMPLANT
FILTER UTR ASPR ASSEMBLY (MISCELLANEOUS) ×3 IMPLANT
GLOVE ECLIPSE 6.5 STRL STRAW (GLOVE) ×3 IMPLANT
GLOVE SRG 8 PF TXTR STRL LF DI (GLOVE) ×2 IMPLANT
GLOVE SURG UNDER POLY LF SZ7 (GLOVE) ×3 IMPLANT
GLOVE SURG UNDER POLY LF SZ8 (GLOVE) ×3
GOWN STRL REUS W/TWL LRG LVL3 (GOWN DISPOSABLE) ×6 IMPLANT
GOWN STRL REUS W/TWL XL LVL3 (GOWN DISPOSABLE) ×3 IMPLANT
HOSE CONNECTING 18IN BERKELEY (TUBING) ×3 IMPLANT
KIT BERKELEY 1ST TRI 3/8 NO TR (MISCELLANEOUS) ×3 IMPLANT
KIT BERKELEY 1ST TRIMESTER 3/8 (MISCELLANEOUS) ×6 IMPLANT
KIT TURNOVER CYSTO (KITS) ×3 IMPLANT
NS IRRIG 500ML POUR BTL (IV SOLUTION) ×3 IMPLANT
PACK VAGINAL MINOR WOMEN LF (CUSTOM PROCEDURE TRAY) ×3 IMPLANT
PAD OB MATERNITY 4.3X12.25 (PERSONAL CARE ITEMS) ×3 IMPLANT
PAD PREP 24X48 CUFFED NSTRL (MISCELLANEOUS) ×3 IMPLANT
SET BERKELEY SUCTION TUBING (SUCTIONS) ×3 IMPLANT
TOWEL OR 17X26 10 PK STRL BLUE (TOWEL DISPOSABLE) ×3 IMPLANT
TRAP TISSUE FILTER (MISCELLANEOUS) ×6 IMPLANT
VACURETTE 7MM CVD STRL WRAP (CANNULA) ×3 IMPLANT

## 2020-06-23 NOTE — Anesthesia Procedure Notes (Signed)
Procedure Name: LMA Insertion Date/Time: 06/23/2020 7:41 AM Performed by: Mechele Claude, CRNA Pre-anesthesia Checklist: Patient identified, Emergency Drugs available, Suction available and Patient being monitored Patient Re-evaluated:Patient Re-evaluated prior to induction Oxygen Delivery Method: Circle system utilized Preoxygenation: Pre-oxygenation with 100% oxygen Induction Type: IV induction Ventilation: Mask ventilation without difficulty LMA: LMA inserted LMA Size: 4.0 Number of attempts: 1 Airway Equipment and Method: Bite block Placement Confirmation: positive ETCO2 Tube secured with: Tape Dental Injury: Teeth and Oropharynx as per pre-operative assessment

## 2020-06-23 NOTE — H&P (Signed)
Denise [name] is an 45 y.o. female G31P1 MAAF here for D&C due to missed abortion.  Pt should be about 10 weeks but ultrasound earlier this week showed a 7 6/7 week fetus without heartbeat.  Ultrasound with radiology confirmed findings.  She does not want to have a miscarriage with misoprostol.  Desires definitive management.  Here for suction D&C.  Risks and benefits were reviewed on the phone with pt when I called after her ultrasound.  Risks including bleeding, infection, uterine injury, retained products, and need for future procedures all discussed.  Pt here and ready to proceed.   Pertinent Gynecological History: DES exposure: denies Blood transfusions: none Sexually transmitted diseases: past history: HSV 2 Previous GYN Procedures: cesarean section  Last mammogram: never has done one Last pap: normal Date: 04/2018 OB History: G3, P1   Menstrual History: Patient's last menstrual period was 04/08/2020.    Past Medical History:  Diagnosis Date  . Anemia    during pregnancy   . Complication of anesthesia    crying excessive spitting  . Concussion age 63 or 78 no residual  . Family history of adverse reaction to anesthesia    ponv  . GERD (gastroesophageal reflux disease)   . HSV-2 infection    2008  . IBS (irritable bowel syndrome)   . IC (interstitial cystitis)    2008  . Migraine   . Pregnancy induced hypertension   . Wears glasses     Past Surgical History:  Procedure Laterality Date  . ADENOIDECTOMY  as child  . BREAST SURGERY  2002   right breast biopsy Benign  . CESAREAN SECTION N/A 08/30/2013   Procedure: CESAREAN SECTION;  Surgeon: Marvene Staff, MD;  Location: [facility];  Service: Obstetrics;  Laterality: N/A;  . CESAREAN SECTION  2015  . colonscopy  04/18/2020   and endoscopy done  . KNEE ARTHROSCOPY  3/11, 2012   right knee  . SHOULDER ARTHROSCOPY Right 2010  . TONSILLECTOMY  as child   and adenoids  . WISDOM TOOTH EXTRACTION  2008    Family History   Problem Relation Age of Onset  . Diabetes Mother   . Hyperlipidemia Mother   . Anemia Mother   . Diabetes Maternal Grandmother   . Heart failure Maternal Grandmother   . Diabetes Paternal Grandmother   . Stroke Paternal Grandmother   . Renal Disease Paternal Grandfather   . Heart disease Paternal Grandfather   . Stroke Paternal Grandfather   . Hyperlipidemia Father   . Hypertension Father   . Heart disease Father   . Heart disease Brother   . Prostate cancer Maternal Grandfather   . Hypertension Sister   . Hypertension Sister   . Thyroid disease Sister   . Anemia Maternal Aunt   . Prostate cancer Paternal Uncle     Social History:  reports that she has never smoked. She has never used smokeless tobacco. She reports previous alcohol use. She reports current drug use. Drug: Marijuana.  Allergies:  Allergies  Allergen Reactions  . Ace Inhibitors     Swelling og tongue and lips  . Adhesive [Tape] Rash  . Latex Itching and Rash    Rash and itching from use of gloves    Medications Prior to Admission  Medication Sig Dispense Refill Last Dose  . famotidine (PEPCID) 20 MG tablet Take 20 mg by mouth as needed for heartburn or indigestion.   06/23/2020 at 0500  . NIFEdipine (PROCARDIA XL/NIFEDICAL XL) 60 MG 24  hr tablet Take 1 tablet (60 mg total) by mouth daily. 30 tablet 11 06/23/2020 at 0500  . Prenatal Vit-Fe Fumarate-FA (PRENATAL MULTIVITAMIN) TABS tablet Take 1 tablet by mouth daily at 12 noon.   Past Week at Unknown time  . cetirizine (ZYRTEC ALLERGY) 10 MG tablet Take 1 tablet (10 mg total) by mouth 2 (two) times daily. 60 tablet 5 More than a month at Unknown time  . fluticasone (FLONASE) 50 MCG/ACT nasal spray Place 1 spray into both nostrils 2 (two) times daily as needed for allergies or rhinitis. 16 g 5 More than a month at Unknown time  . olopatadine (PATANOL) 0.1 % ophthalmic solution 2 (two) times daily as needed.  3 Unknown at Unknown time  . valACYclovir (VALTREX)  1000 MG tablet Take 1 tablet (1,000 mg total) by mouth daily. 90 tablet 4 More than a month at Unknown time    Review of Systems  All other systems reviewed and are negative.   Blood pressure 124/75, pulse 71, temperature 98.3 F (36.8 C), temperature source Oral, resp. rate 16, height 5\' 3"  (1.6 m), weight 76.4 kg, last menstrual period 04/08/2020, SpO2 99 %, unknown if currently breastfeeding. Physical Exam Vitals reviewed.  Constitutional:      Appearance: Normal appearance.  Cardiovascular:     Rate and Rhythm: Normal rate and regular rhythm.  Pulmonary:     Effort: Pulmonary effort is normal.     Breath sounds: Normal breath sounds.  Abdominal:     General: Abdomen is flat.     Palpations: Abdomen is soft.  Neurological:     General: No focal deficit present.     Mental Status: She is alert.  Psychiatric:        Mood and Affect: Mood normal.     Results for orders placed or performed during the hospital encounter of 06/23/20 (from the past 24 hour(s))  CBC     Status: Abnormal   Collection Time: 06/23/20  6:40 AM  Result Value Ref Range   WBC 4.7 4.0 - 10.5 K/uL   RBC 4.38 3.87 - 5.11 MIL/uL   Hemoglobin 13.0 12.0 - 15.0 g/dL   HCT 35.7 (L) 36.0 - 46.0 %   MCV 81.5 80.0 - 100.0 fL   MCH 29.7 26.0 - 34.0 pg   MCHC 36.4 (H) 30.0 - 36.0 g/dL   RDW 12.8 11.5 - 15.5 %   Platelets 225 150 - 400 K/uL   nRBC 0.0 0.0 - 0.2 %    US OB LESS THAN 14 WEEKS WITH OB TRANSVAGINAL  Result Date: 06/21/2020 CLINICAL DATA:  Missed abortion, fetal demise before 20 weeks of gestation EXAM: OBSTETRIC <14 WK [place] AND TRANSVAGINAL OB US TECHNIQUE: Both transabdominal and transvaginal ultrasound examinations were performed for complete evaluation of the gestation as well as the maternal uterus, adnexal regions, and pelvic cul-de-sac. Transvaginal technique was performed to assess early pregnancy. COMPARISON:  None for this gestation FINDINGS: Intrauterine gestational sac: Present, single,  slightly irregular Yolk sac: Questionably visualized as small rounded mildly hyperechoic nodule Embryo:  Present Cardiac Activity: Absent Heart Rate: N/A  bpm CRL:  15 mm   7 w   6 d                  [place] EDC: 02/01/2021 Subchorionic hemorrhage:  None definitely visualized Maternal uterus/adnexae: Amnion noted.  Maternal uterus otherwise unremarkable. RIGHT ovary normal size and morphology, 2.1 x 1.8 x 2.2 cm. LEFT ovary normal size and morphology,  1.7 x 1.2 x 1.7 cm. No free pelvic fluid or adnexal masses. IMPRESSION: Intrauterine gestational sac identified containing a fetal pole 15 mm in length. No fetal cardiac activity identified. Findings meet definitive criteria for failed pregnancy. This follows SRU consensus guidelines: Diagnostic Criteria for Nonviable Pregnancy Early in the First Trimester. Alison Stalling J Med [phone]. Electronically Signed   By: Lavonia Dana M.D.   On: 06/21/2020 13:48    Assessment/Plan: 45 yo G3P1 MAAF with missed abortion at around 10 weeks MBT O+ here for suction D&E.  Pt ready to proceed.  Megan Salon 06/23/2020, 7:10 AM

## 2020-06-23 NOTE — Op Note (Signed)
06/23/2020  8:07 AM  PATIENT:  Denise [name]  45 y.o. female  PRE-OPERATIVE DIAGNOSIS:  Missed AB  POST-OPERATIVE DIAGNOSIS:  Missed AB  PROCEDURE:  Procedure(s): DILATATION AND EVACUATION CHROMOSOME STUDIES  SURGEON:  [place]: none.   ANESTHESIA:   general  ESTIMATED BLOOD LOSS: 40cc  BLOOD ADMINISTERED:none   FLUIDS: 400CC  UOP: voided right before going to OR  SPECIMEN:  Products of conception to pathology and to [place] for Anora testing  DISPOSITION OF SPECIMEN:  PATHOLOGY and [place]  FINDINGS: Uterus about 10 weeks size  DESCRIPTION OF OPERATION: Patient was taken to the operating room.  She is placed in the supine position. SCDs were on her lower extremities and functioning properly. General anesthesia with an LMA was administered without difficulty. Dr. Jillyn Hidden, anesthesia, oversaw case.  Legs were then placed in the [place] in the low lithotomy position. The legs were lifted to the high lithotomy position and the Betadine prep was used on the inner thighs perineum and vagina x3. Patient was draped in a normal standard fashion. Pt voided right before going back to OR so no I&O catheterization was performed.    Time out was performed.  Exam under anesthesia was performed.  Uterus was about 10 weeks size and full feeling.  A bivalve speculum was placed the vagina. The anterior lip of the cervix was grasped with single-tooth tenaculum.  A paracervical block of 1% lidocaine mixed one-to-one with epinephrine (1:100,000 units).  10 cc was used total. The cervix is dilated up to #21 [facility] dilators. The endometrial cavity sounded to 10 cm.   A #7 curved tip suction curette was obtained.  This was passed to the fundus.  Suction applied into the green zone on suction canister.  Tip rotated clockwise until white tissue consistent with products of conception was noted.  suction stopped.  This was performed two more times until no additional white tissue was  noted.  Then using a #2 smooth curetted, the entire endometrial cavity was curetted until a rough gritty texture was noted in all quadrants.  Then one final suction was performed.  Minimal bleeding was noted.   At this point no other procedure was needed and this procedure was ended. The tenaculum was removed.  Minimal bleeding noted from tenaculum sites.  Speculum was removed.  Bimanual exam was performed.  Uterus was now about 8 weeks size and more firm.  The prep was cleansed of the patient's skin. The legs are positioned back in the supine position. Sponge, lap, needle, initially counts were correct x2. Patient was taken to recovery in stable condition.  Tissue was obtained for Anora testing.  COUNTS:  YES  PLAN OF CARE: Transfer to PACU

## 2020-06-23 NOTE — Transfer of Care (Signed)
Immediate Anesthesia Transfer of Care Note  Patient: Denise [name]  Procedure(s) Performed: Procedure(s) (LRB): DILATATION AND EVACUATION (N/A) CHROMOSOME STUDIES  Patient Location: PACU  Anesthesia Type: General  Level of Consciousness: awake, alert  and oriented  Airway & Oxygen Therapy: Patient Spontanous Breathing and Patient connected to face mask oxygen  Post-op Assessment: Report given to PACU RN and Post -op Vital signs reviewed and stable  Post vital signs: Reviewed and stable  Complications: No apparent anesthesia complications Last Vitals:  Vitals Value Taken Time  BP 107/68 06/23/20 0815  Temp    Pulse 141 06/23/20 0816  Resp 22 06/23/20 0816  SpO2 99 % 06/23/20 0816  Vitals shown include unvalidated device data.  Last Pain:  Vitals:   06/23/20 0538  TempSrc: Oral         Complications: No complications documented.

## 2020-06-23 NOTE — Discharge Instructions (Signed)
Post-surgical Instructions, Outpatient Surgery  You may expect to feel dizzy, weak, and drowsy for as long as 24 hours after receiving the medicine that made you sleep (anesthetic). For the first 24 hours after your surgery:    Do not drive a car, ride a bicycle, participate in physical activities, or take public transportation until you are done taking narcotic pain medicines or as directed by Dr. Sabra Heck.   Do not drink alcohol or take tranquilizers.   Do not take medicine that has not been prescribed by your physicians.   Do not sign important papers or make important decisions while on narcotic pain medicines.   Have a responsible person with you.   PAIN MANAGEMENT  Motrin 800mg .  (This is the same as 4-200mg  over the counter tablets of Motrin or ibuprofen.)  You may take this every eight hours or as needed for cramping.    Vicodin 5/325mg .  For more severe pain, take one or two tablets every four to six hours as needed for pain control.  (Remember that narcotic pain medications increase your risk of constipation.  If this becomes a problem, you may take an over the counter stool softener like Colace 100mg  up to four times a day.)  DO'S AND DON'T'S  Do not take a tub bath for one week.  You may shower on the first day after your surgery  Do not do any heavy lifting for one to two weeks.  This increases the chance of bleeding.  Do move around as you feel able.  Stairs are fine.  You may begin to exercise again as you feel able.  Do not lift any weights for two weeks.  Do not put anything in the vagina for two weeks--no tampons, intercourse, or douching.    REGULAR MEDIATIONS/VITAMINS:  You may restart all of your regular medications as prescribed.  You may restart all of your vitamins as you normally take them.    PLEASE CALL OR SEEK MEDICAL CARE IF:  You have persistent nausea and vomiting.   You have trouble eating or drinking.   You have an oral temperature above 100.5.    You have constipation that is not helped by adjusting diet or increasing fluid intake. Pain medicines are a common cause of constipation.   You have heavy vaginal bleeding  DISCHARGE INSTRUCTIONS: D&C / D&E The following instructions have been prepared to help you care for yourself upon your return home.   Personal hygiene: Marland Kitchen Use sanitary pads for vaginal drainage, not tampons. . Shower the day after your procedure. . NO tub baths, pools or Jacuzzis for 2-3 weeks. . Wipe front to back after using the bathroom.  Activity and limitations: . Do NOT drive or operate any equipment for 24 hours. The effects of anesthesia are still present and drowsiness may result. . Do NOT rest in bed all day. . Walking is encouraged. . Walk up and down stairs slowly. . You may resume your normal activity in one to two days or as indicated by your physician.  Sexual activity: NO intercourse for at least 2 weeks after the procedure, or as indicated by your physician.  Diet: Eat a light meal as desired this evening. You may resume your usual diet tomorrow.  Return to work: You may resume your work activities in one to two days or as indicated by your doctor.  What to expect after your surgery: Expect to have vaginal bleeding/discharge for 2-3 days and spotting for up to 10  days. It is not unusual to have soreness for up to 1-2 weeks. You may have a slight burning sensation when you urinate for the first day. Mild cramps may continue for a couple of days. You may have a regular period in 2-6 weeks.  Call your doctor for any of the following: . Excessive vaginal bleeding, saturating and changing one pad every hour. . Inability to urinate 6 hours after discharge from hospital. . Pain not relieved by pain medication. . Fever of 100.4 F or greater. . Unusual vaginal discharge or odor.  Post Anesthesia Home Care Instructions  Activity: Get plenty of rest for the remainder of the day. A responsible  individual must stay with you for 24 hours following the procedure.  For the next 24 hours, DO NOT: -Drive a car -Paediatric nurse -Drink alcoholic beverages -Take any medication unless instructed by your physician -Make any legal decisions or sign important papers.  Meals: Start with liquid foods such as gelatin or soup. Progress to regular foods as tolerated. Avoid greasy, spicy, heavy foods. If nausea and/or vomiting occur, drink only clear liquids until the nausea and/or vomiting subsides. Call your physician if vomiting continues.  Special Instructions/Symptoms: Your throat may feel dry or sore from the anesthesia or the breathing tube placed in your throat during surgery. If this causes discomfort, gargle with warm salt water. The discomfort should disappear within 24 hours.

## 2020-06-23 NOTE — Anesthesia Postprocedure Evaluation (Signed)
Anesthesia Post Note  Patient: Denise [name]  Procedure(s) Performed: DILATATION AND EVACUATION (N/A ) CHROMOSOME STUDIES     Patient location during evaluation: PACU Anesthesia Type: General Level of consciousness: awake and sedated Pain management: pain level controlled Vital Signs Assessment: post-procedure vital signs reviewed and stable Respiratory status: spontaneous breathing Cardiovascular status: stable Postop Assessment: no apparent nausea or vomiting Anesthetic complications: no   No complications documented.  Last Vitals:  Vitals:   06/23/20 0830 06/23/20 0840  BP: 138/88 137/78  Pulse: (!) 127 99  Resp: 12 13  Temp:  36.9 C  SpO2: 100% 99%    Last Pain:  Vitals:   06/23/20 0812  TempSrc:   PainSc: Asleep   Pain Goal:                   Huston Foley

## 2020-06-24 LAB — SURGICAL PATHOLOGY

## 2020-06-27 ENCOUNTER — Encounter (HOSPITAL_BASED_OUTPATIENT_CLINIC_OR_DEPARTMENT_OTHER): Payer: Self-pay | Admitting: Obstetrics & Gynecology

## 2020-06-27 ENCOUNTER — Ambulatory Visit: Payer: BC Managed Care – PPO | Admitting: Allergy

## 2020-06-28 ENCOUNTER — Encounter: Payer: BC Managed Care – PPO | Admitting: Obstetrics & Gynecology

## 2020-07-04 ENCOUNTER — Encounter (HOSPITAL_BASED_OUTPATIENT_CLINIC_OR_DEPARTMENT_OTHER): Payer: Self-pay

## 2020-07-07 ENCOUNTER — Encounter: Payer: Self-pay | Admitting: *Deleted

## 2020-07-20 ENCOUNTER — Encounter (HOSPITAL_BASED_OUTPATIENT_CLINIC_OR_DEPARTMENT_OTHER): Payer: Self-pay

## 2020-07-21 ENCOUNTER — Ambulatory Visit (INDEPENDENT_AMBULATORY_CARE_PROVIDER_SITE_OTHER): Payer: BC Managed Care – PPO | Admitting: Obstetrics & Gynecology

## 2020-07-21 ENCOUNTER — Encounter (HOSPITAL_BASED_OUTPATIENT_CLINIC_OR_DEPARTMENT_OTHER): Payer: Self-pay

## 2020-07-21 ENCOUNTER — Other Ambulatory Visit: Payer: Self-pay

## 2020-07-21 ENCOUNTER — Encounter (HOSPITAL_BASED_OUTPATIENT_CLINIC_OR_DEPARTMENT_OTHER): Payer: Self-pay | Admitting: Obstetrics & Gynecology

## 2020-07-21 VITALS — BP 151/82 | HR 76 | Ht 63.0 in | Wt 170.0 lb

## 2020-07-21 DIAGNOSIS — N96 Recurrent pregnancy loss: Secondary | ICD-10-CM | POA: Diagnosis not present

## 2020-07-21 DIAGNOSIS — O021 Missed abortion: Secondary | ICD-10-CM

## 2020-07-21 NOTE — Progress Notes (Signed)
GYNECOLOGY  VISIT  CC:   Follow up after D&E  HPI: 45 y.o. [id number] Married Denise [name] American female here for follow up after D&E on 06/23/2020.  Reports she done well since procedure but has experienced a lot of brown stopping since the procedure.  Real bleeding started 07/19/20 and has been like a normal cycles.  She has experienced some mild cramping.  She is not having any today.  Denies any abnormal discharge or fever.    Pathology report reviewed.  Normal chorionic villi noted.  Fetal karyotype testing was ordered but there was not enough specimen for analysis (although almost all of the tissue was sent.)    Separately, we also discussed the evaluation for recurrent miscarriages.  Thyroid testing, antiphospholipid antibody testing and parental karyotype testing discussed.  We discussed what these findings would indicate and how treatment would change.  If all of the above is negative, then likely cause is decreased fertility.  We discussed use of donor egg today.  Pt has undergone an HSG with Dr. Kerin Perna.  She had one blocked tube.  They discussed ovulation induction agents.  Due to ectopic risks, if she decides to pursue this, I will have her proceed with treatment with Dr. Kerin Perna.    Pt is concerned a medication she took in early pregnancy may have caused the miscarriage. We reviewed these together and there is not anything she took that caused the missed ab.    We discussed local support group and therapy options for her.  Names and information provided.  Patient Active Problem List   Diagnosis Date Noted  . Missed abortion 06/20/2020  . Perennial allergic rhinitis 04/10/2018  . Chronic urticaria 04/10/2018  . 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 Essential hypertension, benign 02/24/2016  . PIH (pregnancy induced hypertension) 08/29/2013  . Murmur 06/26/2013  . Palpitations 06/26/2013  . IBS (irritable bowel syndrome)   . Migraine   . GERD (gastroesophageal reflux disease)    . HSV-2 infection   . IC (interstitial cystitis)     Past Medical History:  Diagnosis Date  . Anemia    during pregnancy   . Complication of anesthesia    crying excessive spitting  . Concussion age 66 or 89 no residual  . Family history of adverse reaction to anesthesia    ponv  . GERD (gastroesophageal reflux disease)   . HSV-2 infection    2008  . Hypertension   . IBS (irritable bowel syndrome)   . IC (interstitial cystitis)    2008  . Migraine   . Wears glasses     Past Surgical History:  Procedure Laterality Date  . ADENOIDECTOMY  as child  . BREAST SURGERY  2002   right breast biopsy Benign  . CESAREAN SECTION N/A 08/30/2013   Procedure: CESAREAN SECTION;  Surgeon: Marvene Staff, MD;  Location: [facility];  Service: Obstetrics;  Laterality: N/A;  . colonscopy  04/18/2020   and endoscopy done  . DILATION AND EVACUATION N/A 06/23/2020   Procedure: DILATATION AND EVACUATION;  Surgeon: Megan Salon, MD;  [facility];  Service: Gynecology;  Laterality: N/A;  . KNEE ARTHROSCOPY  3/11, 2012   right knee  . SHOULDER ARTHROSCOPY Right 2010  . TONSILLECTOMY  as child   and adenoids  . WISDOM TOOTH EXTRACTION  2008    MEDS:   Current Outpatient Medications on File Prior to Visit  Medication Sig Dispense Refill  . cetirizine (ZYRTEC ALLERGY) 10 MG tablet Take  1 tablet (10 mg total) by mouth 2 (two) times daily. 60 tablet 5  . famotidine (PEPCID) 20 MG tablet Take 20 mg by mouth as needed for heartburn or indigestion.    . fluticasone (FLONASE) 50 MCG/ACT nasal spray Place 1 spray into both nostrils 2 (two) times daily as needed for allergies or rhinitis. 16 g 5  . ibuprofen (ADVIL) 800 MG tablet Take 1 tablet (800 mg total) by mouth every 8 (eight) hours as needed. 30 tablet 0  . indapamide (LOZOL) 1.25 MG tablet Take 1.25 mg by mouth daily.    Marland Kitchen NIFEdipine (PROCARDIA XL/NIFEDICAL XL) 60 MG 24 hr tablet Take 1 tablet (60 mg total) by mouth  daily. 30 tablet 11  . olopatadine (PATANOL) 0.1 % ophthalmic solution 2 (two) times daily as needed.  3  . Prenatal Vit-Fe Fumarate-FA (PRENATAL MULTIVITAMIN) TABS tablet Take 1 tablet by mouth daily at 12 noon.    . TRULANCE 3 MG TABS     . valACYclovir (VALTREX) 1000 MG tablet Take 1 tablet (1,000 mg total) by mouth daily. 90 tablet 4   No current facility-administered medications on file prior to visit.    ALLERGIES: Ace inhibitors, Adhesive [tape], and Latex  Family History  Problem Relation Age of Onset  . Diabetes Mother   . Hyperlipidemia Mother   . Anemia Mother   . Diabetes Maternal Grandmother   . Heart failure Maternal Grandmother   . Diabetes Paternal Grandmother   . Stroke Paternal Grandmother   . Renal Disease Paternal Grandfather   . Heart disease Paternal Grandfather   . Stroke Paternal Grandfather   . Hyperlipidemia Father   . Hypertension Father   . Heart disease Father   . Heart disease Brother   . Prostate cancer Maternal Grandfather   . Hypertension Sister   . Hypertension Sister   . Thyroid disease Sister   . Anemia Maternal Aunt   . Prostate cancer Paternal Uncle     SH:  Married, non smoker  Review of Systems  Psychiatric/Behavioral: Negative for confusion.  All other systems reviewed and are negative.   PHYSICAL EXAMINATION:    BP (!) 151/82   Pulse 76   Ht 5\' 3"  (1.6 m)   Wt 170 lb (77.1 kg)   LMP 04/08/2020   BMI 30.11 kg/m     General appearance: alert, cooperative and appears stated age Lymph:  no inguinal LAD noted  Pelvic: External genitalia:  no lesions              Urethra:  normal appearing urethra with no masses, tenderness or lesions              Bartholins and Skenes: normal                 Vagina: normal appearing vagina with normal color and discharge, no lesions              Cervix: no lesions              Bimanual Exam:  Uterus:  normal size, contour, position, consistency, mobility, non-tender              Adnexa:  no mass, fullness, tenderness  Chaperone, Shela Nevin, RN, was present for exam.  Assessment/Plan: 1. Missed abortion with fetal demise before 58 completed weeks of gestation - doing well from post op standpoint  2. History of recurrent miscarriages - work up discussed including TSH, TPO antibodies, antiphospholipid antibody syndrome, parental karyotype (  esp since fetal tissue tissue was no enough for processing).      In additional to post op visit, additional 20 minutes of total time was spent discussing the evaluation and work up for recurrent miscarriages as well as what would change with any abnormal results.

## 2020-07-21 NOTE — Patient Instructions (Signed)
[email or web address] Local number [phone]  [facility] Christina Rush [email or web address]

## 2020-07-22 ENCOUNTER — Other Ambulatory Visit (HOSPITAL_BASED_OUTPATIENT_CLINIC_OR_DEPARTMENT_OTHER): Payer: Self-pay | Admitting: Obstetrics & Gynecology

## 2020-07-22 DIAGNOSIS — N96 Recurrent pregnancy loss: Secondary | ICD-10-CM

## 2020-07-22 MED ORDER — INDAPAMIDE 1.25 MG PO TABS: 1.2500 mg | ORAL_TABLET | Freq: Every day | ORAL | 4 refills | Status: DC

## 2020-07-25 ENCOUNTER — Other Ambulatory Visit (HOSPITAL_BASED_OUTPATIENT_CLINIC_OR_DEPARTMENT_OTHER): Payer: Self-pay | Admitting: Obstetrics & Gynecology

## 2020-07-26 ENCOUNTER — Other Ambulatory Visit: Payer: Self-pay

## 2020-07-26 ENCOUNTER — Other Ambulatory Visit (HOSPITAL_BASED_OUTPATIENT_CLINIC_OR_DEPARTMENT_OTHER): Payer: Self-pay | Admitting: Obstetrics & Gynecology

## 2020-07-26 ENCOUNTER — Other Ambulatory Visit: Payer: BC Managed Care – PPO

## 2020-07-26 DIAGNOSIS — N96 Recurrent pregnancy loss: Secondary | ICD-10-CM

## 2020-07-26 DIAGNOSIS — Z8759 Personal history of other complications of pregnancy, childbirth and the puerperium: Secondary | ICD-10-CM

## 2020-07-26 MED ORDER — VALACYCLOVIR HCL 1 G PO TABS: 1000.0000 mg | ORAL_TABLET | Freq: Every day | ORAL | 1 refills | Status: DC

## 2020-07-26 NOTE — Progress Notes (Signed)
Future order for karyotype placed.

## 2020-07-27 ENCOUNTER — Other Ambulatory Visit: Payer: BC Managed Care – PPO

## 2020-07-28 ENCOUNTER — Other Ambulatory Visit (HOSPITAL_BASED_OUTPATIENT_CLINIC_OR_DEPARTMENT_OTHER): Payer: Self-pay | Admitting: Obstetrics & Gynecology

## 2020-08-02 ENCOUNTER — Other Ambulatory Visit (HOSPITAL_BASED_OUTPATIENT_CLINIC_OR_DEPARTMENT_OTHER): Payer: Self-pay | Admitting: Obstetrics & Gynecology

## 2020-08-02 MED ORDER — INDAPAMIDE 1.25 MG PO TABS: 1.2500 mg | ORAL_TABLET | Freq: Every day | ORAL | 4 refills | Status: DC

## 2020-08-02 MED ORDER — NIFEDIPINE ER OSMOTIC RELEASE 60 MG PO TB24: 60.0000 mg | ORAL_TABLET | Freq: Every day | ORAL | 3 refills | Status: DC

## 2020-08-08 ENCOUNTER — Encounter (HOSPITAL_BASED_OUTPATIENT_CLINIC_OR_DEPARTMENT_OTHER): Payer: Self-pay

## 2020-08-08 ENCOUNTER — Other Ambulatory Visit: Payer: Self-pay

## 2020-08-08 ENCOUNTER — Ambulatory Visit (INDEPENDENT_AMBULATORY_CARE_PROVIDER_SITE_OTHER): Payer: BC Managed Care – PPO | Admitting: Allergy

## 2020-08-08 ENCOUNTER — Encounter: Payer: Self-pay | Admitting: Allergy

## 2020-08-08 VITALS — BP 108/70 | HR 77 | Temp 98.2°F | Resp 18 | Ht 63.0 in | Wt 170.6 lb

## 2020-08-08 DIAGNOSIS — L508 Other urticaria: Secondary | ICD-10-CM

## 2020-08-08 DIAGNOSIS — J3089 Other allergic rhinitis: Secondary | ICD-10-CM

## 2020-08-08 MED ORDER — FLUTICASONE PROPIONATE 50 MCG/ACT NA SUSP: 1.0000 | Freq: Two times a day (BID) | NASAL | 5 refills | Status: DC | PRN

## 2020-08-08 MED ORDER — CETIRIZINE HCL 10 MG PO TABS: 10.0000 mg | ORAL_TABLET | Freq: Two times a day (BID) | ORAL | 5 refills | Status: DC | PRN

## 2020-08-08 NOTE — Assessment & Plan Note (Signed)
Past history - Rhinitis symptoms for the past 5 years mainly during the spring and summer.  Using Singulair and Allegra as needed with good benefit. 2019 skin testing was positive to dust mites and cats.  Interim history - asymptomatic.   Continue environmental control measures to dust mites and cats.  ? May use Flonase 1 spray in each nostril twice a day as needed. ? May use over the counter antihistamines such as Zyrtec (cetirizine), Claritin (loratadine), Allegra (fexofenadine), or Xyzal (levocetirizine) daily as needed.

## 2020-08-08 NOTE — Assessment & Plan Note (Signed)
Past history - Pruritic hives/welts on and off for the past 10 months. Seemed to be worse since she had miscarriage and seem to occur more frequently during her menses. Positive dermatographism on exam. Bloodwork unremarkable - Thyroid Cascade Profile, Comprehensive metabolic panel, CBC with Differential/Platelet, ANA w/Reflex Interim history - only taking zyrtec prn now and doing well up until she had D&C and had some itching.   May take zyrtec 10mg  up to twice a day as needed.   Avoid the following potential triggers: alcohol, tight clothing, NSAIDs.  Sometimes stress on the body can trigger the hives.

## 2020-08-08 NOTE — Progress Notes (Signed)
Follow Up Note  RE: Denise [name] MRN: [id number] DOB: [birth year] Date of Office Visit: 08/08/2020  Referring provider: Lucianne Lei, MD Primary care provider: Lucianne Lei, MD  Chief Complaint: Urticaria  History of Present Illness: I had the pleasure of seeing Denise [name] for a follow up visit at the [facility] on 08/08/2020. She is a 45 y.o. female, who is being followed for chronic urticaria and allergic rhinitis. Her previous allergy office visit was on 06/22/2019 with Dr. Maudie Mercury. Today is a regular follow up visit.  Chronic urticaria Patient had a slight flare about a few weeks after she had D&C. Only taking zyrtec as needed now with good benefit.   Perennial allergic rhinitis Only takes Flonase prn for nasal congestion with good benefit.  She had dental check up and noted to have some sinus congestion on X-ray - denies clinical symptoms.   Assessment and Plan: [name] is a 45 y.o. female with: Chronic urticaria Past history - Pruritic hives/welts on and off for the past 10 months. Seemed to be worse since she had miscarriage and seem to occur more frequently during her menses. Positive dermatographism on exam. Bloodwork unremarkable - Thyroid Cascade Profile, Comprehensive metabolic panel, CBC with Differential/Platelet, ANA w/Reflex Interim history - only taking zyrtec prn now and doing well up until she had D&C and had some itching.   May take zyrtec 10mg  up to twice a day as needed.   Avoid the following potential triggers: alcohol, tight clothing, NSAIDs.  Sometimes stress on the body can trigger the hives.  Perennial allergic rhinitis Past history - Rhinitis symptoms for the past 5 years mainly during the spring and summer.  Using Singulair and Allegra as needed with good benefit. 2019 skin testing was positive to dust mites and cats.  Interim history - asymptomatic.   Continue environmental control measures to dust mites and cats.  ? May use  Flonase 1 spray in each nostril twice a day as needed. ? May use over the counter antihistamines such as Zyrtec (cetirizine), Claritin (loratadine), Allegra (fexofenadine), or Xyzal (levocetirizine) daily as needed.  Return in about 1 year (around 08/08/2021).  Meds ordered this encounter  Medications  . cetirizine (ZYRTEC ALLERGY) 10 MG tablet    Sig: Take 1 tablet (10 mg total) by mouth 2 (two) times daily as needed (hives).    Dispense:  60 tablet    Refill:  5  . fluticasone (FLONASE) 50 MCG/ACT nasal spray    Sig: Place 1 spray into both nostrils 2 (two) times daily as needed for allergies or rhinitis.    Dispense:  16 g    Refill:  5   Lab Orders  No laboratory test(s) ordered today    Diagnostics:  Medication List:  Current Outpatient Medications  Medication Sig Dispense Refill  . famotidine (PEPCID) 20 MG tablet Take 20 mg by mouth as needed for heartburn or indigestion.    Marland Kitchen ibuprofen (ADVIL) 800 MG tablet Take 1 tablet (800 mg total) by mouth every 8 (eight) hours as needed. 30 tablet 0  . indapamide (LOZOL) 1.25 MG tablet Take 1 tablet (1.25 mg total) by mouth daily. 30 tablet 4  . NIFEdipine (PROCARDIA XL/NIFEDICAL XL) 60 MG 24 hr tablet Take 1 tablet (60 mg total) by mouth daily. 30 tablet 3  . olopatadine (PATANOL) 0.1 % ophthalmic solution 2 (two) times daily as needed.  3  . Prenatal Vit-Fe Fumarate-FA (PRENATAL MULTIVITAMIN) TABS tablet Take 1 tablet by  mouth daily at 12 noon.    . TRULANCE 3 MG TABS     . valACYclovir (VALTREX) 1000 MG tablet Take 1 tablet (1,000 mg total) by mouth daily. 90 tablet 1  . cetirizine (ZYRTEC ALLERGY) 10 MG tablet Take 1 tablet (10 mg total) by mouth 2 (two) times daily as needed (hives). 60 tablet 5  . fluticasone (FLONASE) 50 MCG/ACT nasal spray Place 1 spray into both nostrils 2 (two) times daily as needed for allergies or rhinitis. 16 g 5   No current facility-administered medications for this visit.   Allergies: Allergies   Allergen Reactions  . Ace Inhibitors     Swelling og tongue and lips  . Adhesive [Tape] Rash  . Latex Itching and Rash    Rash and itching from use of gloves   I reviewed her past medical history, social history, family history, and environmental history and no significant changes have been reported from her previous visit.  Review of Systems  Constitutional: Negative for appetite change, chills, fever and unexpected weight change.  HENT: Negative for congestion, rhinorrhea and sinus pressure.   Eyes: Negative for itching.  Respiratory: Negative for cough, chest tightness, shortness of breath and wheezing.   Cardiovascular: Negative for chest pain.  Gastrointestinal: Negative for abdominal pain.  Genitourinary: Negative for difficulty urinating.  Skin: Negative for rash.  Allergic/Immunologic: Positive for environmental allergies. Negative for food allergies.  Neurological: Negative for headaches.   Objective: BP 108/70 (BP Location: Left Arm, Patient Position: Sitting, Cuff Size: Normal)   Pulse 77   Temp 98.2 F (36.8 C) (Temporal)   Resp 18   Ht 5\' 3"  (1.6 m)   Wt 170 lb 9.6 oz (77.4 kg)   LMP 04/08/2020   SpO2 97%   BMI 30.22 kg/m  Body mass index is 30.22 kg/m. Physical Exam Vitals and nursing note reviewed.  Constitutional:      Appearance: Normal appearance. She is well-developed.  HENT:     Head: Normocephalic and atraumatic.     Right Ear: Tympanic membrane and external ear normal.     Left Ear: Tympanic membrane and external ear normal.     Nose: Nose normal.  Eyes:     Conjunctiva/sclera: Conjunctivae normal.  Cardiovascular:     Rate and Rhythm: Normal rate and regular rhythm.     Heart sounds: Normal heart sounds. No murmur heard. No friction rub. No gallop.   Pulmonary:     Effort: Pulmonary effort is normal.     Breath sounds: Normal breath sounds. No wheezing or rales.  Musculoskeletal:     Cervical back: Neck supple.  Skin:    General: Skin  is warm.     Findings: No rash.  Neurological:     Mental Status: She is alert and oriented to person, place, and time.  Psychiatric:        Behavior: Behavior normal.    Previous notes and tests were reviewed. The plan was reviewed with the patient/family, and all questions/concerned were addressed.  It was my pleasure to see Denise [name] today and participate in her care. Please feel free to contact me with any questions or concerns.  Sincerely,  Rexene Alberts, DO Allergy & Immunology  [facility] office: [place] office: [phone]

## 2020-08-08 NOTE — Patient Instructions (Addendum)
Perennial allergic rhinitis 2019 skin testing was positive to dust mites and cats.   Continue environmental control measures to dust mites and cats.  ? May use Flonase 1 spray in each nostril twice a day as needed for allergies.   Chronic urticaria  May take zyrtec 10mg  up to twice a day as needed.   Avoid the following potential triggers: alcohol, tight clothing, NSAIDs.  Sometimes stress on the body can trigger the hives.  Follow up in 1 year or sooner if needed.

## 2020-08-12 LAB — CHROMOSOME, BLOOD, ROUTINE
Cells Analyzed: 20
Cells Counted: 20
Cells Karyotyped: 2
GTG Band Resolution Achieved: 500

## 2020-08-12 LAB — ANTIPHOSPHOLIPID SYNDROME EVAL, BLD
APTT PPP: 24.5 s (ref 22.9–30.2)
Anticardiolipin IgG: <9 GPL U/mL (ref 0–14)
Anticardiolipin IgM: <9 MPL U/mL (ref 0–12)
Beta-2 Glyco 1 IgM: <9 GPI IgM units (ref 0–32)
Beta-2 Glyco I IgG: <9 GPI IgG units (ref 0–20)
Dilute Viper Venom Time: 28.8 s (ref 0.0–47.0)
Hexagonal Phase Phospholipid: 1 s (ref 0–11)
INR: 1 (ref 0.9–1.2)
PT: 10.3 s (ref 9.1–12.0)
Thrombin Time: 18.1 s (ref 0.0–23.0)

## 2020-08-12 LAB — COAG STUDIES INTERP REPORT

## 2020-08-12 LAB — TSH: TSH: 1.67 u[IU]/mL (ref 0.450–4.500)

## 2020-08-12 LAB — THYROID PEROXIDASE ANTIBODY: Thyroperoxidase Ab SerPl-aCnc: <8 IU/mL (ref 0–34)

## 2020-08-16 ENCOUNTER — Ambulatory Visit: Payer: BC Managed Care – PPO

## 2020-08-19 ENCOUNTER — Encounter (HOSPITAL_BASED_OUTPATIENT_CLINIC_OR_DEPARTMENT_OTHER): Payer: Self-pay

## 2020-08-31 ENCOUNTER — Encounter (HOSPITAL_BASED_OUTPATIENT_CLINIC_OR_DEPARTMENT_OTHER): Payer: Self-pay

## 2020-09-19 ENCOUNTER — Encounter (HOSPITAL_BASED_OUTPATIENT_CLINIC_OR_DEPARTMENT_OTHER): Payer: Self-pay

## 2020-09-20 ENCOUNTER — Other Ambulatory Visit (HOSPITAL_BASED_OUTPATIENT_CLINIC_OR_DEPARTMENT_OTHER): Payer: Self-pay | Admitting: Obstetrics & Gynecology

## 2020-09-20 MED ORDER — MEDROXYPROGESTERONE ACETATE 10 MG PO TABS: 10.0000 mg | ORAL_TABLET | Freq: Every day | ORAL | 0 refills | Status: DC

## 2020-09-30 NOTE — Telephone Encounter (Signed)
FYI

## 2020-12-19 DIAGNOSIS — Z1231 Encounter for screening mammogram for malignant neoplasm of breast: Secondary | ICD-10-CM | POA: Diagnosis not present

## 2020-12-26 ENCOUNTER — Encounter (HOSPITAL_BASED_OUTPATIENT_CLINIC_OR_DEPARTMENT_OTHER): Payer: Self-pay

## 2020-12-26 ENCOUNTER — Other Ambulatory Visit: Payer: Self-pay | Admitting: *Deleted

## 2020-12-26 MED ORDER — INDAPAMIDE 1.25 MG PO TABS: 1.2500 mg | ORAL_TABLET | Freq: Every day | ORAL | 0 refills | Status: DC

## 2020-12-29 ENCOUNTER — Encounter (HOSPITAL_BASED_OUTPATIENT_CLINIC_OR_DEPARTMENT_OTHER): Payer: Self-pay | Admitting: Obstetrics & Gynecology

## 2021-01-16 ENCOUNTER — Telehealth: Payer: Self-pay | Admitting: Family Medicine

## 2021-01-16 NOTE — Telephone Encounter (Signed)
Requesting a transfer of care to Dr. Billey Gosling. Patient's sister Nesiah Hathway is already a patient of Dr. Quay Burow and would like her sister to be seen by her as well.

## 2021-01-16 NOTE — Telephone Encounter (Signed)
Yes, I will accept her 

## 2021-01-26 ENCOUNTER — Encounter: Payer: Self-pay | Admitting: Internal Medicine

## 2021-01-26 DIAGNOSIS — Z833 Family history of diabetes mellitus: Secondary | ICD-10-CM | POA: Insufficient documentation

## 2021-01-26 NOTE — Progress Notes (Signed)
Subjective:    Patient ID: Denise [name], female    DOB: [birth year], 45 y.o.   MRN: [id number]  HPI She is here to establish with a new pcp.  The patient is here for follow up of their chronic medical problems, including htn, migraines, GERD, IBS  She has no concerns.  She is here to establish with a new Primary care physician.  Her sister, who is also my patient, just recently had a stroke and she is trying to get back into regular exercise and healthy habits.  She would like to get her weight down.  Medications and allergies reviewed with patient and updated if appropriate.  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 60 tablet 5   famotidine (PEPCID) 20 MG tablet Take 20 mg by mouth as needed for heartburn or indigestion.     fluticasone (FLONASE) 50 MCG/ACT nasal spray Place 1 spray into both nostrils 2 (two) times daily as needed for allergies or rhinitis. 16 g 5   medroxyPROGESTERone (PROVERA) 10 MG tablet Take 1 tablet (10 mg total) by mouth daily. 10 tablet 0   olopatadine (PATANOL) 0.1 % ophthalmic solution 2 (two) times daily as needed.  3   Prenatal Vit-Fe Fumarate-FA (PRENATAL MULTIVITAMIN) TABS tablet Take 1 tablet by mouth daily at 12 noon.     TRULANCE 3 MG TABS      valACYclovir (VALTREX) 1000 MG tablet  Take 1 tablet (1,000 mg total) by mouth daily. 90 tablet 1   No current facility-administered medications on file prior to visit.    Past Medical History:  Diagnosis Date   Anemia    during pregnancy    Complication of anesthesia    crying excessive spitting   Concussion age 61 or 67 no residual   Family history of adverse reaction to anesthesia    ponv   GERD (gastroesophageal reflux disease)    HSV-2 infection    2008   Hypertension    IBS (irritable bowel syndrome)    IC (interstitial cystitis)    2008   Migraine    Wears glasses     Past Surgical History:  Procedure Laterality Date   ADENOIDECTOMY  as child   BREAST SURGERY  2002   right breast biopsy Benign   CESAREAN SECTION N/A 08/30/2013   Procedure: CESAREAN SECTION;  Surgeon: Marvene Staff, MD;  Location: [facility];  Service: Obstetrics;  Laterality: N/A;   colonscopy  04/18/2020   and endoscopy done   DILATION AND EVACUATION N/A 06/23/2020   Procedure: DILATATION AND EVACUATION;  Surgeon: Megan Salon, MD;  Location: [facility];  Service: Gynecology;  Laterality: N/A;   KNEE ARTHROSCOPY  3/11, 2012   right knee   SHOULDER ARTHROSCOPY Right 2010   TONSILLECTOMY  as  child   and adenoids   WISDOM TOOTH EXTRACTION  2008    Social History   Socioeconomic History   Marital status: Married    Spouse name: Not on file   Number of children: Not on file   Years of education: Not on file   Highest education level: Not on file  Occupational History   Not on file  Tobacco Use   Smoking status: Never   Smokeless tobacco: Never  Vaping Use   Vaping Use: Never used  Substance and Sexual Activity   Alcohol use: Not Currently   Drug use: Yes    Types: Marijuana    Comment: marijuana last use sept 2021   Sexual activity: Yes    Partners: Male    Birth control/protection: None  Other Topics Concern   Not on file  Social History Narrative   Not on file   Social Determinants of Health    Financial Resource Strain: Not on file  Food Insecurity: Not on file  Transportation Needs: Not on file  Physical Activity: Not on file  Stress: Not on file  Social Connections: Not on file    Family History  Problem Relation Age of Onset   Diabetes Mother    Hyperlipidemia Mother    Anemia Mother    Hyperlipidemia Father    Hypertension Father    Heart disease Father    Stroke Sister    Hypertension Sister    Hypertension Sister    Thyroid disease Sister    Heart disease Brother    Anemia Maternal Aunt    Prostate cancer Paternal Uncle    Diabetes Maternal Grandmother    Heart failure Maternal Grandmother    Prostate cancer Maternal Grandfather    Diabetes Paternal Grandmother    Stroke Paternal Grandmother    Renal Disease Paternal Grandfather    Heart disease Paternal Grandfather    Stroke Paternal Grandfather     Review of Systems  Constitutional:  Negative for chills and fever.  Respiratory:  Negative for cough, shortness of breath and wheezing.   Cardiovascular:  Positive for palpitations (occ) and leg swelling (occ). Negative for chest pain.  Gastrointestinal:  Negative for abdominal pain, blood in stool, constipation (except for flares with IBS) and diarrhea (except for flares with IBS).  Genitourinary:  Negative for dysuria and hematuria.  Musculoskeletal:        CTS R> L  Neurological:  Positive for headaches. Negative for light-headedness.      Objective:   Vitals:   01/27/21 1046  BP: 124/76  Pulse: 81  Temp: 98.1 F (36.7 C)  SpO2: 99%   BP Readings from Last 3 Encounters:  01/27/21 124/76  08/08/20 108/70  07/21/20 (!) 151/82   Wt Readings from Last 3 Encounters:  01/27/21 171 lb (77.6 kg)  08/08/20 170 lb 9.6 oz (77.4 kg)  07/21/20 170 lb (77.1 kg)   Body mass index is 30.29 kg/m. Depression screen [facility] 2/9 01/27/2021  Decreased Interest 0  Down, Depressed, Hopeless 1  PHQ - 2 Score 1  Altered sleeping 1  Tired, decreased energy 1   Change in appetite 0  Feeling bad or failure about yourself  1  Trouble concentrating 1  Moving slowly or fidgety/restless 0  Suicidal thoughts 0  PHQ-9 Score 5  Difficult doing work/chores Somewhat difficult    GAD 7 : Generalized Anxiety Score 01/27/2021  Nervous, Anxious, on Edge 0  Control/stop worrying 1  Worry too much - different things  1  Trouble relaxing 1  Restless 0  Easily annoyed or irritable 1  Afraid - awful might happen 0  Total GAD 7 Score 4  Anxiety Difficulty Somewhat difficult      Physical Exam    Constitutional: Appears well-developed and well-nourished. No distress.  HENT:  Head: Normocephalic and atraumatic.  Neck: Neck supple. No tracheal deviation present. No thyromegaly present.  No cervical lymphadenopathy Cardiovascular: Normal rate, regular rhythm and normal heart sounds.   No murmur heard. No carotid bruit .  No edema Pulmonary/Chest: Effort normal and breath sounds normal. No respiratory distress. No has no wheezes. No rales.  Abdomen: Soft, nontender, nondistended Skin: Skin is warm and dry. Not diaphoretic.  Psychiatric: Normal mood and affect. Behavior is normal.      Assessment & Plan:      See Problem List for Assessment and Plan of chronic medical problems.    This visit occurred during the SARS-CoV-2 public health emergency.  Safety protocols were in place, including screening questions prior to the visit, additional usage of staff PPE, and extensive cleaning of exam room while observing appropriate contact time as indicated for disinfecting solutions.

## 2021-01-26 NOTE — Patient Instructions (Addendum)
  Blood work was ordered.      Medications changes include :   none    Please followup in 6 months   [place] [facility] [place]  Dr Mel Almond - [phone] Dr Terrance Mass   [phone] Dr Ernestene Mention   [phone]  Sandford Craze - clinical social worker/therapist -  [phone]  [facility] [place]  [phone]  [facility] [place] [phone] Office  [facility]            [place]

## 2021-01-27 ENCOUNTER — Other Ambulatory Visit: Payer: Self-pay

## 2021-01-27 ENCOUNTER — Encounter: Payer: Self-pay | Admitting: Internal Medicine

## 2021-01-27 ENCOUNTER — Ambulatory Visit (INDEPENDENT_AMBULATORY_CARE_PROVIDER_SITE_OTHER): Payer: BC Managed Care – PPO | Admitting: Internal Medicine

## 2021-01-27 VITALS — BP 124/76 | HR 81 | Temp 98.1°F | Ht 63.0 in | Wt 171.0 lb

## 2021-01-27 DIAGNOSIS — Z833 Family history of diabetes mellitus: Secondary | ICD-10-CM | POA: Diagnosis not present

## 2021-01-27 DIAGNOSIS — K582 Mixed irritable bowel syndrome: Secondary | ICD-10-CM

## 2021-01-27 DIAGNOSIS — F32A Depression, unspecified: Secondary | ICD-10-CM | POA: Diagnosis not present

## 2021-01-27 DIAGNOSIS — F419 Anxiety disorder, unspecified: Secondary | ICD-10-CM

## 2021-01-27 DIAGNOSIS — K219 Gastro-esophageal reflux disease without esophagitis: Secondary | ICD-10-CM

## 2021-01-27 DIAGNOSIS — E785 Hyperlipidemia, unspecified: Secondary | ICD-10-CM | POA: Insufficient documentation

## 2021-01-27 DIAGNOSIS — G43109 Migraine with aura, not intractable, without status migrainosus: Secondary | ICD-10-CM

## 2021-01-27 DIAGNOSIS — B009 Herpesviral infection, unspecified: Secondary | ICD-10-CM

## 2021-01-27 DIAGNOSIS — I10 Essential (primary) hypertension: Secondary | ICD-10-CM

## 2021-01-27 DIAGNOSIS — N3010 Interstitial cystitis (chronic) without hematuria: Secondary | ICD-10-CM

## 2021-01-27 DIAGNOSIS — Z1331 Encounter for screening for depression: Secondary | ICD-10-CM

## 2021-01-27 DIAGNOSIS — E782 Mixed hyperlipidemia: Secondary | ICD-10-CM

## 2021-01-27 LAB — CBC WITH DIFFERENTIAL/PLATELET
Basophils Absolute: 0 10*3/uL (ref 0.0–0.1)
Basophils Relative: 0.9 % (ref 0.0–3.0)
Eosinophils Absolute: 0 10*3/uL (ref 0.0–0.7)
Eosinophils Relative: 0.8 % (ref 0.0–5.0)
HCT: 39.3 % (ref 36.0–46.0)
Hemoglobin: 13.7 g/dL (ref 12.0–15.0)
Lymphocytes Relative: 45.4 % (ref 12.0–46.0)
Lymphs Abs: 1.9 10*3/uL (ref 0.7–4.0)
MCHC: 34.8 g/dL (ref 30.0–36.0)
MCV: 84.2 fl (ref 78.0–100.0)
Monocytes Absolute: 0.5 10*3/uL (ref 0.1–1.0)
Monocytes Relative: 10.8 % (ref 3.0–12.0)
Neutro Abs: 1.8 10*3/uL (ref 1.4–7.7)
Neutrophils Relative %: 42.1 % — ABNORMAL LOW (ref 43.0–77.0)
Platelets: 269 10*3/uL (ref 150.0–400.0)
RBC: 4.67 Mil/uL (ref 3.87–5.11)
RDW: 13.3 % (ref 11.5–15.5)
WBC: 4.3 10*3/uL (ref 4.0–10.5)

## 2021-01-27 LAB — COMPREHENSIVE METABOLIC PANEL
ALT: 12 U/L (ref 0–35)
AST: 17 U/L (ref 0–37)
Albumin: 4.5 g/dL (ref 3.5–5.2)
Alkaline Phosphatase: 59 U/L (ref 39–117)
BUN: 11 mg/dL (ref 6–23)
CO2: 30 mEq/L (ref 19–32)
Calcium: 10 mg/dL (ref 8.4–10.5)
Chloride: 100 mEq/L (ref 96–112)
Creatinine, Ser: 0.82 mg/dL (ref 0.40–1.20)
GFR: 87.59 mL/min (ref >60.00)
Glucose, Bld: 88 mg/dL (ref 70–99)
Potassium: 3.6 mEq/L (ref 3.5–5.1)
Sodium: 138 mEq/L (ref 135–145)
Total Bilirubin: 1.3 mg/dL — ABNORMAL HIGH (ref 0.2–1.2)
Total Protein: 7.4 g/dL (ref 6.0–8.3)

## 2021-01-27 LAB — LIPID PANEL
Cholesterol: 253 mg/dL — ABNORMAL HIGH (ref 0–200)
HDL: 80.4 mg/dL (ref >39.00)
LDL Cholesterol: 155 mg/dL — ABNORMAL HIGH (ref 0–99)
NonHDL: 172.34
Total CHOL/HDL Ratio: 3
Triglycerides: 85 mg/dL (ref 0.0–149.0)
VLDL: 17 mg/dL (ref 0.0–40.0)

## 2021-01-27 LAB — HEMOGLOBIN A1C: Hgb A1c MFr Bld: 5.4 % (ref 4.6–6.5)

## 2021-01-27 MED ORDER — INDAPAMIDE 1.25 MG PO TABS: 1.2500 mg | ORAL_TABLET | Freq: Every day | ORAL | 0 refills | Status: DC

## 2021-01-27 MED ORDER — NIFEDIPINE ER OSMOTIC RELEASE 60 MG PO TB24: 60.0000 mg | ORAL_TABLET | Freq: Every day | ORAL | 2 refills | Status: DC

## 2021-01-27 MED ORDER — INDAPAMIDE 1.25 MG PO TABS: 1.2500 mg | ORAL_TABLET | Freq: Every day | ORAL | 2 refills | Status: DC

## 2021-01-27 NOTE — Assessment & Plan Note (Signed)
Chronic Managed by Dr. Sabra Heck Taking Valtrex 1000 mg daily

## 2021-01-27 NOTE — Assessment & Plan Note (Signed)
Chronic Has never been symptomatic and denies any symptoms now

## 2021-01-27 NOTE — Assessment & Plan Note (Signed)
Chronic Well-controlled here today and per patient has been controlled Continue indapamide 1.25 mg daily, nifedipine 60 mg daily CMP, CBC

## 2021-01-27 NOTE — Assessment & Plan Note (Signed)
Chronic Controlled Has flares between constipation and diarrhea Currently taking Trulance

## 2021-01-27 NOTE — Assessment & Plan Note (Signed)
Chronic Was on cholesterol medication in the past and did fine with it, but prescription ran out Will check lipid panel She is weightbearing age and has been pregnant over the past couple of years so at this point she is not a candidate for cholesterol medication even if needed Continue exercise and weight loss efforts Stressed healthy diet

## 2021-01-27 NOTE — Assessment & Plan Note (Signed)
Her mother has diabetes Will check A1c Encouraged healthy diet, regular exercise She is working on weight loss

## 2021-01-27 NOTE — Assessment & Plan Note (Signed)
Acute She does have some elements of anxiety and depression-she admits to this and her PHQ-9 and GAD-7 scores are both positive for mild depression and anxiety Most of this is related to the miscarriage she had in January-this is the third miscarriage she has had She feels like she is doing better, but think she probably should talk to someone.  He was given recommendations by her gynecologist but has not followed through with establishing with someone Stressed that he needs to discuss this with someone Names and numbers given for therapists

## 2021-01-27 NOTE — Assessment & Plan Note (Signed)
Chronic With aura Trigger - hormonal, weather Tylenol prn

## 2021-01-27 NOTE — Assessment & Plan Note (Signed)
Chronic GERD is controlled Taking Pepcid 20 mg daily as needed only-continue

## 2021-02-28 ENCOUNTER — Ambulatory Visit (HOSPITAL_BASED_OUTPATIENT_CLINIC_OR_DEPARTMENT_OTHER): Payer: BC Managed Care – PPO | Admitting: Obstetrics & Gynecology

## 2021-03-01 ENCOUNTER — Other Ambulatory Visit: Payer: Self-pay

## 2021-03-01 ENCOUNTER — Ambulatory Visit (INDEPENDENT_AMBULATORY_CARE_PROVIDER_SITE_OTHER): Payer: BC Managed Care – PPO | Admitting: Obstetrics & Gynecology

## 2021-03-01 ENCOUNTER — Encounter (HOSPITAL_BASED_OUTPATIENT_CLINIC_OR_DEPARTMENT_OTHER): Payer: Self-pay | Admitting: Obstetrics & Gynecology

## 2021-03-01 ENCOUNTER — Other Ambulatory Visit (HOSPITAL_COMMUNITY)
Admission: RE | Admit: 2021-03-01 | Discharge: 2021-03-01 | Disposition: A | Discharge status: Home / Self Care | Payer: BC Managed Care – PPO | Source: Ambulatory Visit | Attending: Obstetrics & Gynecology | Admitting: Obstetrics & Gynecology

## 2021-03-01 VITALS — BP 120/82 | HR 79 | Ht 64.0 in | Wt 169.4 lb

## 2021-03-01 DIAGNOSIS — F419 Anxiety disorder, unspecified: Secondary | ICD-10-CM

## 2021-03-01 DIAGNOSIS — Z01419 Encounter for gynecological examination (general) (routine) without abnormal findings: Secondary | ICD-10-CM | POA: Diagnosis not present

## 2021-03-01 DIAGNOSIS — R350 Frequency of micturition: Secondary | ICD-10-CM

## 2021-03-01 DIAGNOSIS — Z124 Encounter for screening for malignant neoplasm of cervix: Secondary | ICD-10-CM | POA: Insufficient documentation

## 2021-03-01 DIAGNOSIS — B009 Herpesviral infection, unspecified: Secondary | ICD-10-CM | POA: Diagnosis not present

## 2021-03-01 DIAGNOSIS — F32A Depression, unspecified: Secondary | ICD-10-CM

## 2021-03-01 LAB — POCT URINALYSIS DIPSTICK
Bilirubin, UA: NEGATIVE
Blood, UA: NEGATIVE
Glucose, UA: NEGATIVE
Ketones, UA: NEGATIVE
Leukocytes, UA: NEGATIVE
Nitrite, UA: NEGATIVE
Protein, UA: NEGATIVE
Spec Grav, UA: 1.02 (ref 1.010–1.025)
Urobilinogen, UA: 0.2 E.U./dL
pH, UA: 7.5 (ref 5.0–8.0)

## 2021-03-01 MED ORDER — VALACYCLOVIR HCL 1 G PO TABS: 1000.0000 mg | ORAL_TABLET | Freq: Every day | ORAL | 4 refills | Status: DC

## 2021-03-01 NOTE — Addendum Note (Signed)
Addended by: Octaviano Batty B on: 03/01/2021 05:04 PM   Modules accepted: Orders

## 2021-03-01 NOTE — Progress Notes (Signed)
45 y.o. G84P1021 Married [place] American female here for annual exam.  Cycles are irregular and were 17 days apart for several months.  Reports the cycles in August was followed by another bleeding episode about 10 days later.  Then the next cycle was 17 days later.  H/o AMH that was 0.07 and 0.05 drawn twice in the last two years.  Does not want to on anything right now.    Having some low back pain.  Request urine to be tested.  Was negative.          Sexually active: Yes.    The current method of family planning is none.    Exercising:  Smoker:  no  Health Maintenance: Pap:  05/22/2018 Negative History of abnormal Pap:  no MMG:  12/19/2020 Negative Colonoscopy:  03/2020 TDaP:  2015 Screening Labs: 01/2021   reports that she has never smoked. She has never used smokeless tobacco. She reports that she does not currently use alcohol. She reports current drug use. Drug: Marijuana.  Past Medical History:  Diagnosis Date   Anemia    during pregnancy    Complication of anesthesia    crying excessive spitting   Concussion age 6 or 47 no residual   Family history of adverse reaction to anesthesia    ponv   GERD (gastroesophageal reflux disease)    HSV-2 infection    2008   Hypertension    IBS (irritable bowel syndrome)    IC (interstitial cystitis)    2008   Migraine    Wears glasses     Past Surgical History:  Procedure Laterality Date   ADENOIDECTOMY  as child   BREAST SURGERY  2002   right breast biopsy Benign   CESAREAN SECTION N/A 08/30/2013   Procedure: CESAREAN SECTION;  Surgeon: Marvene Staff, MD;  Location: [facility];  Service: Obstetrics;  Laterality: N/A;   colonscopy  04/18/2020   and endoscopy done   DILATION AND EVACUATION N/A 06/23/2020   Procedure: DILATATION AND EVACUATION;  Surgeon: Megan Salon, MD;  Location: [facility];  Service: Gynecology;  Laterality: N/A;   KNEE ARTHROSCOPY  3/11, 2012   right knee   SHOULDER  ARTHROSCOPY Right 2010   TONSILLECTOMY  as child   and adenoids   WISDOM TOOTH EXTRACTION  2008    Current Outpatient Medications  Medication Sig Dispense Refill   cetirizine (ZYRTEC ALLERGY) 10 MG tablet Take 1 tablet (10 mg total) by mouth 2 (two) times daily as needed (hives). 60 tablet 5   famotidine (PEPCID) 20 MG tablet Take 20 mg by mouth as needed for heartburn or indigestion.     fluticasone (FLONASE) 50 MCG/ACT nasal spray Place 1 spray into both nostrils 2 (two) times daily as needed for allergies or rhinitis. 16 g 5   indapamide (LOZOL) 1.25 MG tablet Take 1 tablet (1.25 mg total) by mouth daily. 90 tablet 2   NIFEdipine (PROCARDIA XL/NIFEDICAL XL) 60 MG 24 hr tablet Take 1 tablet (60 mg total) by mouth daily. 90 tablet 2   olopatadine (PATANOL) 0.1 % ophthalmic solution 2 (two) times daily as needed.  3   Prenatal Vit-Fe Fumarate-FA (PRENATAL MULTIVITAMIN) TABS tablet Take 1 tablet by mouth daily at 12 noon.     TRULANCE 3 MG TABS      valACYclovir (VALTREX) 1000 MG tablet Take 1 tablet (1,000 mg total) by mouth daily. 90 tablet 4   No current facility-administered medications for this  visit.    Family History  Problem Relation Age of Onset   Diabetes Mother    Hyperlipidemia Mother    Anemia Mother    Hyperlipidemia Father    Hypertension Father    Heart disease Father    Stroke Sister    Hypertension Sister    Hypertension Sister    Thyroid disease Sister    Heart disease Brother    Anemia Maternal Aunt    Prostate cancer Paternal Uncle    Diabetes Maternal Grandmother    Heart failure Maternal Grandmother    Prostate cancer Maternal Grandfather    Diabetes Paternal Grandmother    Stroke Paternal Grandmother    Renal Disease Paternal Grandfather    Heart disease Paternal Grandfather    Stroke Paternal Grandfather     Review of Systems  All other systems reviewed and are negative.  Exam:   BP 120/82 (BP Location: Left Arm, Patient Position: Sitting,  Cuff Size: Large)   Pulse 79   Ht 5\' 4"  (1.626 m)   Wt 169 lb 6.4 oz (76.8 kg)   BMI 29.08 kg/m   Height: 5\' 4"  (162.6 cm)  General appearance: alert, cooperative and appears stated age Head: Normocephalic, without obvious abnormality, atraumatic Neck: no adenopathy, supple, symmetrical, trachea midline and thyroid normal to inspection and palpation Lungs: clear to auscultation bilaterally Breasts: normal appearance, no masses or tenderness Heart: regular rate and rhythm Abdomen: soft, non-tender; bowel sounds normal; no masses,  no organomegaly Extremities: extremities normal, atraumatic, no cyanosis or edema Skin: Skin color, texture, turgor normal. No rashes or lesions Lymph nodes: Cervical, supraclavicular, and axillary nodes normal. No abnormal inguinal nodes palpated Neurologic: Grossly normal  Pelvic: External genitalia:  no lesions              Urethra:  normal appearing urethra with no masses, tenderness or lesions              Bartholins and Skenes: normal                 Vagina: normal appearing vagina with normal color and no discharge, no lesions              Cervix: no lesions              Pap taken: Yes.   Bimanual Exam:  Uterus:  normal size, contour, position, consistency, mobility, non-tender              Adnexa: normal adnexa and no mass, fullness, tenderness               Rectovaginal: Confirms               Anus:  normal sphincter tone, no lesions  Chaperone, Octaviano Batty, CMA, was present for exam.  Assessment/Plan: 1. Well woman exam with routine gynecological exam - pap and HR HPV obtained today - MM 11/2020 - colonoscopy 2021 with Dr. Earlean Shawl - lab work done with Dr. Quay Burow  2. Cervical cancer screening - Cytology - PAP( [facility])  3. Anxiety and depression  4. HSV-2 infection - valACYclovir (VALTREX) 1000 MG tablet; Take 1 tablet (1,000 mg total) by mouth daily.  Dispense: 90 tablet; Refill: 4  5.  Irregular cycles - pt does not want to be on  any medications right now.  Options discussed.

## 2021-03-02 LAB — CYTOLOGY - PAP
Comment: NEGATIVE
Diagnosis: NEGATIVE
High risk HPV: NEGATIVE

## 2021-05-04 ENCOUNTER — Encounter: Payer: Self-pay | Admitting: Internal Medicine

## 2021-05-04 NOTE — Progress Notes (Signed)
Subjective:    Patient ID: Denise [name], female    DOB: [birth year], 45 y.o.   MRN: [id number]  This visit occurred during the SARS-CoV-2 public health emergency.  Safety protocols were in place, including screening questions prior to the visit, additional usage of staff PPE, and extensive cleaning of exam room while observing appropriate contact time as indicated for disinfecting solutions.    HPI She is here for an acute visit for cold symptoms.   Her symptoms started 5 days ago  She is experiencing congestion, ear pain and clogged sensation, sinus pain/pressure, headaches and dizziness/lightheadedness.    She has tried taking Zyrtec and Flonase   She does have a history of getting a sinus infection every year around this time.  Symptoms are similar.   Medications and allergies reviewed with patient and updated if appropriate.  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 60 tablet 5   famotidine (PEPCID) 20 MG tablet Take 20 mg by mouth as needed for heartburn or indigestion.     fluticasone (FLONASE) 50 MCG/ACT nasal spray Place 1 spray into both nostrils 2 (two) times daily as needed for allergies or rhinitis. 16 g 5   indapamide (LOZOL) 1.25 MG tablet Take 1 tablet (1.25  mg total) by mouth daily. 90 tablet 2   NIFEdipine (PROCARDIA XL/NIFEDICAL XL) 60 MG 24 hr tablet Take 1 tablet (60 mg total) by mouth daily. 90 tablet 2   olopatadine (PATANOL) 0.1 % ophthalmic solution 2 (two) times daily as needed.  3   Prenatal Vit-Fe Fumarate-FA (PRENATAL MULTIVITAMIN) TABS tablet Take 1 tablet by mouth daily at 12 noon.     TRULANCE 3 MG TABS      valACYclovir (VALTREX) 1000 MG tablet Take 1 tablet (1,000 mg total) by mouth daily. 90 tablet 4   No current facility-administered medications on file prior to visit.    Past Medical History:  Diagnosis Date   Anemia    during pregnancy    Complication of anesthesia    crying excessive spitting   Concussion age 69 or 89 no residual   Family history of adverse reaction to anesthesia    ponv   GERD (gastroesophageal reflux disease)    HSV-2 infection    2008   Hypertension    IBS (irritable bowel syndrome)    IC (interstitial cystitis)    2008   Migraine    Wears glasses     Past Surgical History:  Procedure Laterality Date   ADENOIDECTOMY  as child   BREAST SURGERY  2002   right breast biopsy Benign   CESAREAN SECTION N/A 08/30/2013   Procedure: CESAREAN SECTION;  Surgeon: Marvene Staff, MD;  Location: [facility];  Service: Obstetrics;  Laterality: N/A;  colonscopy  04/18/2020   and endoscopy done   DILATION AND EVACUATION N/A 06/23/2020   Procedure: DILATATION AND EVACUATION;  Surgeon: Megan Salon, MD;  Location: [facility];  Service: Gynecology;  Laterality: N/A;   KNEE ARTHROSCOPY  3/11, 2012   right knee   SHOULDER ARTHROSCOPY Right 2010   TONSILLECTOMY  as child   and adenoids   WISDOM TOOTH EXTRACTION  2008    Social History   Socioeconomic History   Marital status: Married    Spouse name: Not on file   Number of children: Not on file   Years of education: Not on file   Highest education level: Not on file  Occupational History   Not on file  Tobacco Use   Smoking  status: Never   Smokeless tobacco: Never  Vaping Use   Vaping Use: Never used  Substance and Sexual Activity   Alcohol use: Not Currently   Drug use: Yes    Types: Marijuana    Comment: marijuana last use sept 2021   Sexual activity: Yes    Partners: Male    Birth control/protection: None  Other Topics Concern   Not on file  Social History Narrative   Not on file   Social Determinants of Health   Financial Resource Strain: Not on file  Food Insecurity: Not on file  Transportation Needs: Not on file  Physical Activity: Not on file  Stress: Not on file  Social Connections: Not on file    Family History  Problem Relation Age of Onset   Diabetes Mother    Hyperlipidemia Mother    Anemia Mother    Hyperlipidemia Father    Hypertension Father    Heart disease Father    Stroke Sister    Hypertension Sister    Hypertension Sister    Thyroid disease Sister    Heart disease Brother    Anemia Maternal Aunt    Prostate cancer Paternal Uncle    Diabetes Maternal Grandmother    Heart failure Maternal Grandmother    Prostate cancer Maternal Grandfather    Diabetes Paternal Grandmother    Stroke Paternal Grandmother    Renal Disease Paternal Grandfather    Heart disease Paternal Grandfather    Stroke Paternal Grandfather     Review of Systems  Constitutional:  Negative for fever.  HENT:  Positive for congestion (mild), ear pain (left ear pain and clogged sensation), sinus pressure and sinus pain. Negative for postnasal drip and sore throat.   Respiratory:  Negative for cough, shortness of breath and wheezing.   Gastrointestinal:  Positive for nausea.  Neurological:  Positive for dizziness, light-headedness and headaches.      Objective:   Vitals:   05/05/21 1036  BP: 110/80  Pulse: 87  Temp: 98.5 F (36.9 C)  SpO2: 96%   BP Readings from Last 3 Encounters:  05/05/21 110/80  03/01/21 120/82  01/27/21 124/76   Wt Readings from Last 3 Encounters:  05/05/21 169  lb (76.7 kg)  03/01/21 169 lb 6.4 oz (76.8 kg)  01/27/21 171 lb (77.6 kg)   Body mass index is 29.01 kg/m.   Physical Exam    GENERAL APPEARANCE: Appears stated age, well appearing, NAD EYES: conjunctiva clear, no icterus HENT: bilateral tympanic membranes and ear canals normal, oropharynx with no erythema or exudates, trachea midline, no cervical or supraclavicular lymphadenopathy LUNGS: Unlabored breathing, good air entry bilaterally, clear to auscultation without wheeze or crackles CARDIOVASCULAR: Normal S1,S2 , no  edema SKIN: Warm, dry      Assessment & Plan:    See Problem List for Assessment and Plan of chronic medical problems.

## 2021-05-05 ENCOUNTER — Ambulatory Visit (INDEPENDENT_AMBULATORY_CARE_PROVIDER_SITE_OTHER): Payer: BC Managed Care – PPO | Admitting: Internal Medicine

## 2021-05-05 ENCOUNTER — Encounter: Payer: Self-pay | Admitting: Internal Medicine

## 2021-05-05 ENCOUNTER — Other Ambulatory Visit: Payer: Self-pay

## 2021-05-05 DIAGNOSIS — J0100 Acute maxillary sinusitis, unspecified: Secondary | ICD-10-CM | POA: Diagnosis not present

## 2021-05-05 DIAGNOSIS — J019 Acute sinusitis, unspecified: Secondary | ICD-10-CM | POA: Insufficient documentation

## 2021-05-05 MED ORDER — AMOXICILLIN-POT CLAVULANATE 875-125 MG PO TABS: 1.0000 | ORAL_TABLET | Freq: Two times a day (BID) | ORAL | 0 refills | Status: DC

## 2021-05-05 NOTE — Assessment & Plan Note (Signed)
Acute Symptoms moderate in nature and not improving with conservative measures Start Augmentin 875-125 mg BID x 10 day otc cold medications Rest, fluid Call if no improvement

## 2021-05-05 NOTE — Patient Instructions (Signed)
° ° °  Take the antibiotic as prescribed - complete the entire course.   ° ° °Continue over the counter cold medication, advil and tylenol.  Increase your fluids and rest.  ° ° °Call if no improvement   ° °

## 2021-06-18 IMAGING — US US OB < 14 WEEKS - US OB TV
1 series · 13 of 28 positions shown · non-contrast
Comparison: None for this gestation

CLINICAL DATA: Missed abortion, fetal demise before 20 weeks of
gestation

EXAM:
OBSTETRIC <14 WK US AND TRANSVAGINAL OB US
TECHNIQUE: Both transabdominal and transvaginal ultrasound examinations were
performed for complete evaluation of the gestation as well as the
maternal uterus, adnexal regions, and pelvic cul-de-sac.
Transvaginal technique was performed to assess early pregnancy.

[Series 1: us ob less than 14 weeks with ob transvaginal · 13 of 126 slices shown]
[im 5/126]
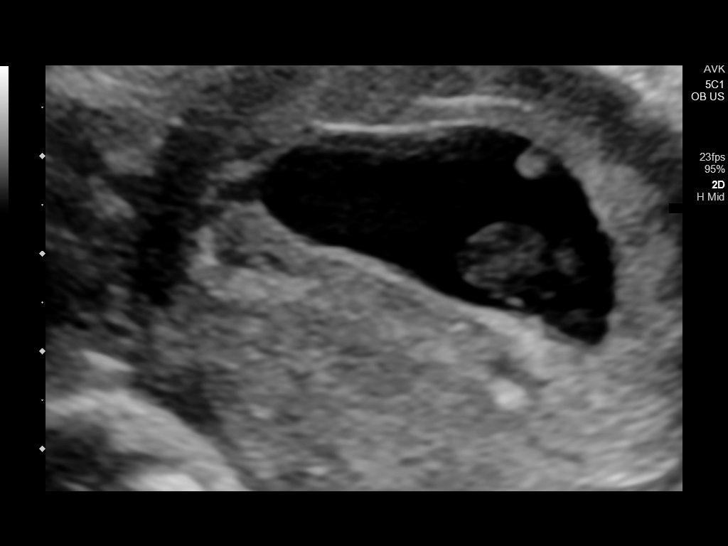
[im 14/126]
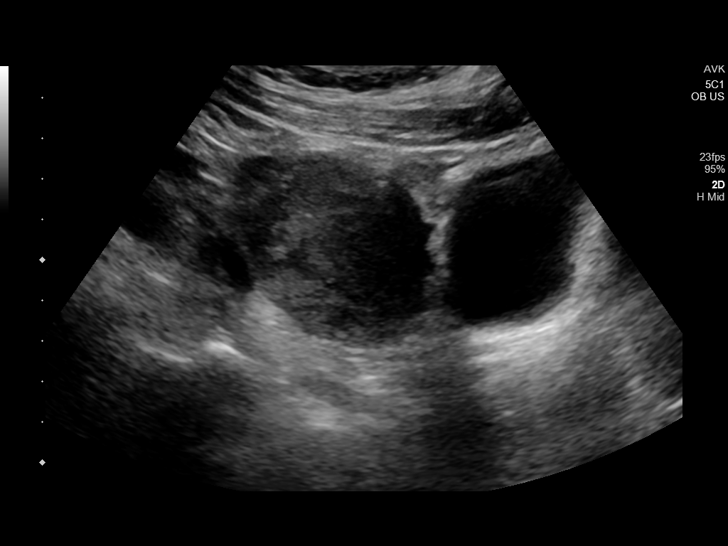
[im 24/126]
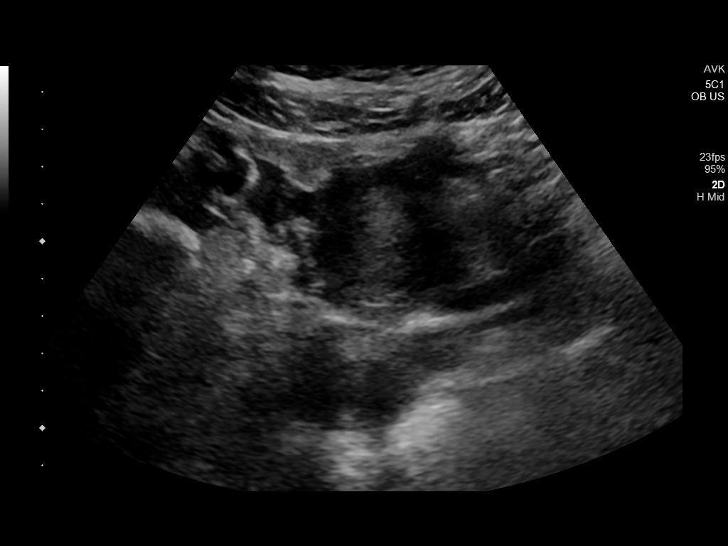
[im 33/126]
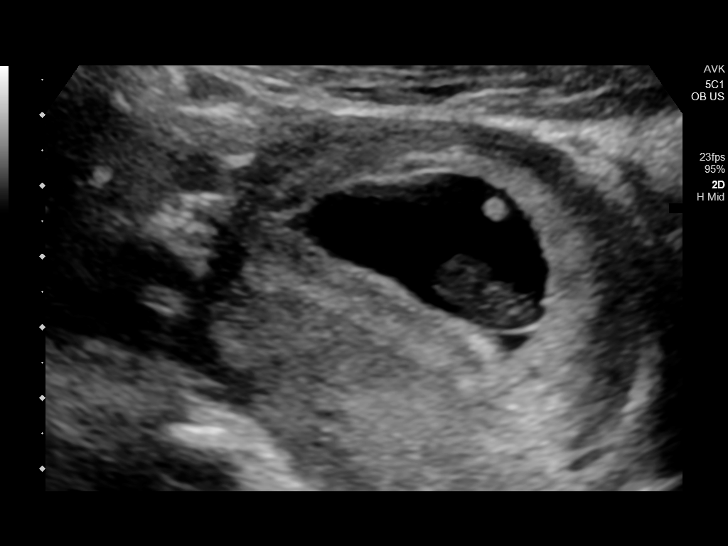
[im 42/126]
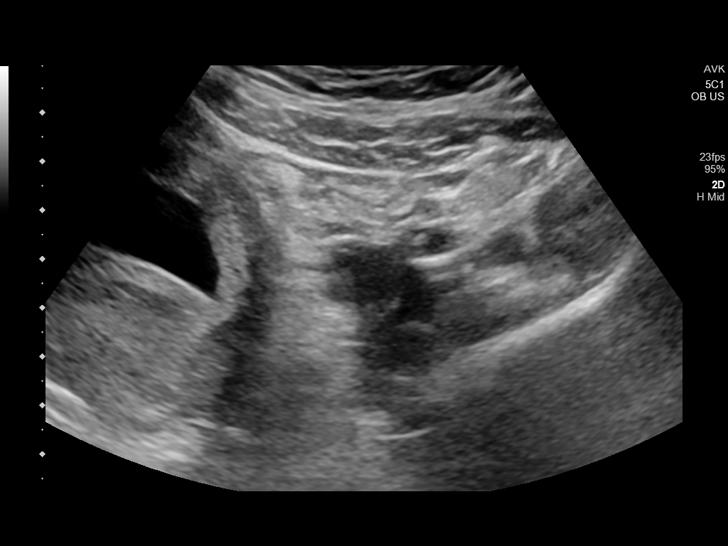
[im 51/126]
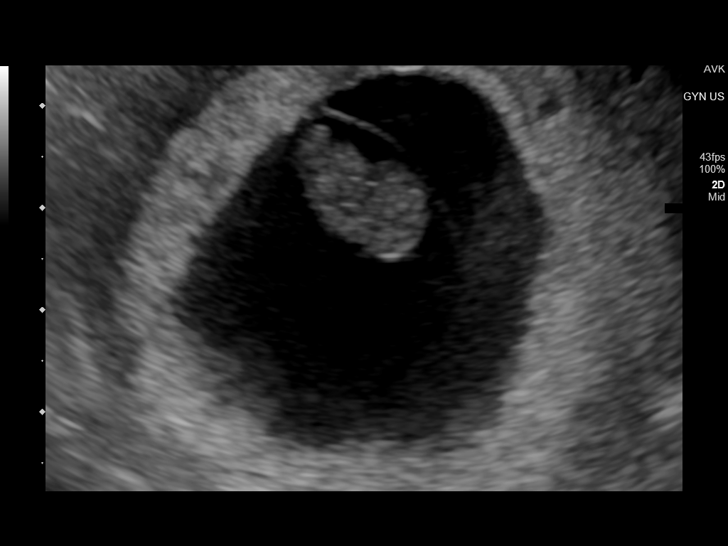
[im 65/126]
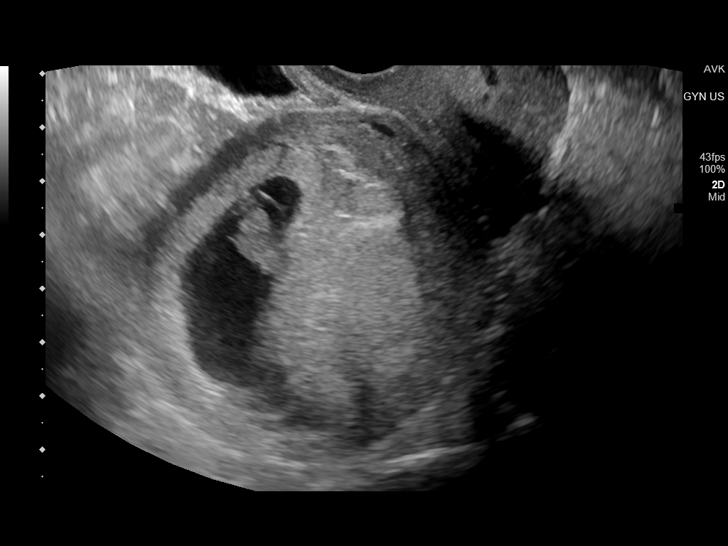
[im 75/126]
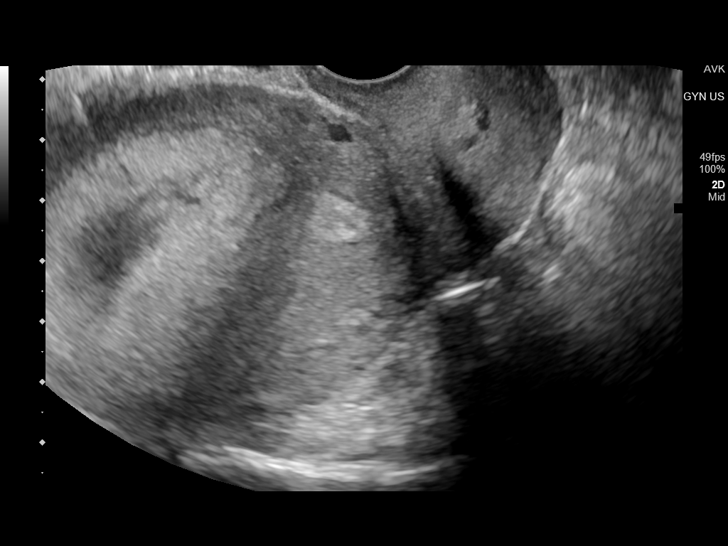
[im 84/126]
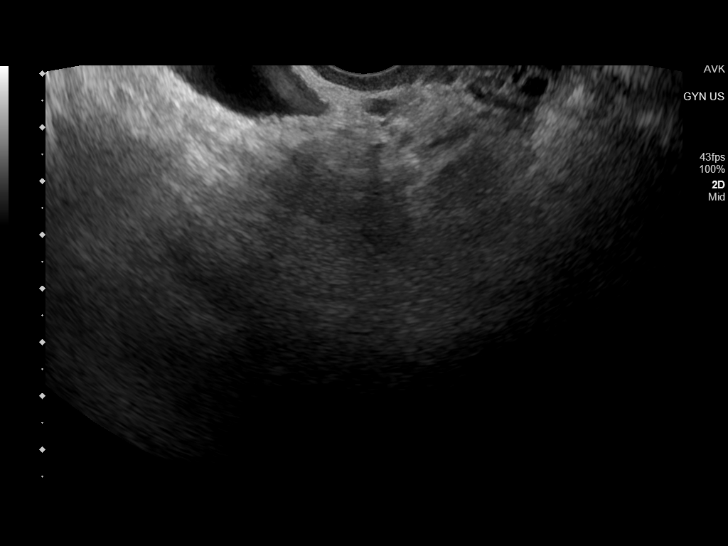
[im 93/126]
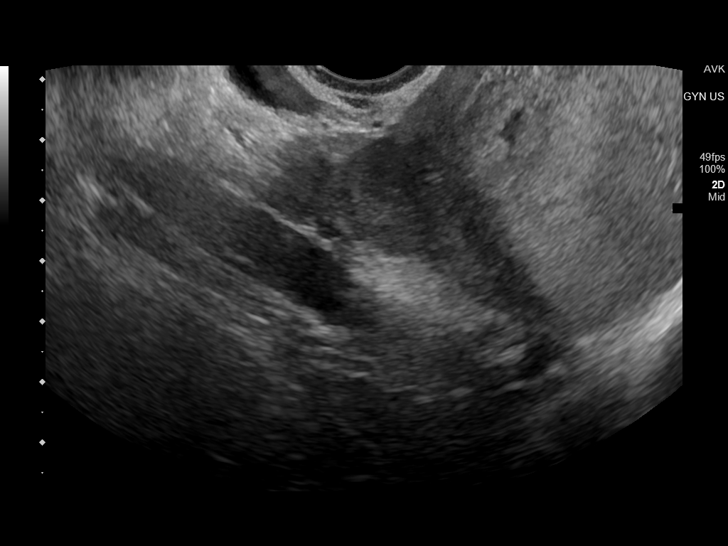
[im 102/126]
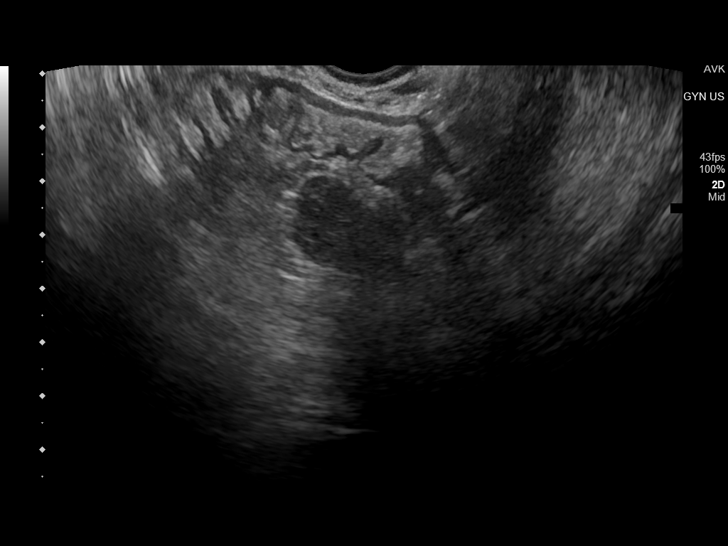
[im 112/126]
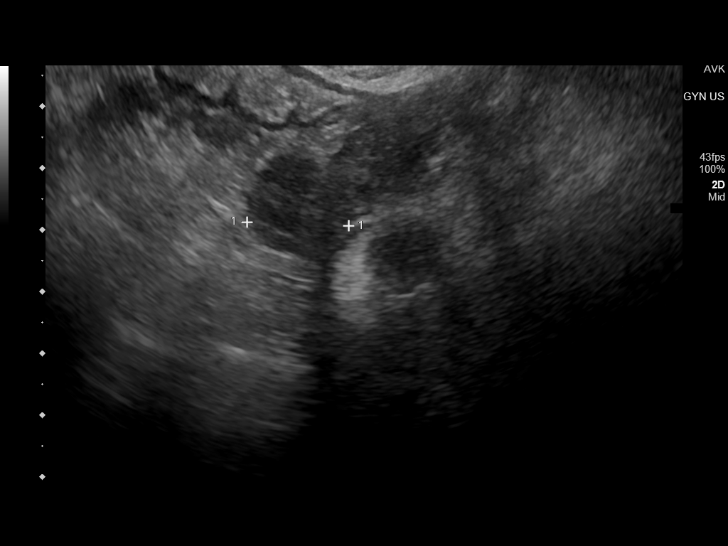
[im 121/126]
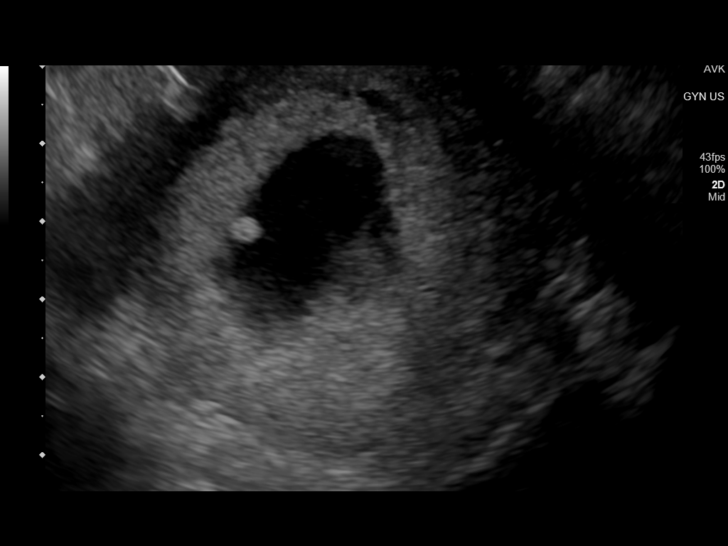

[13 of 28 positions shown; findings below may reference images not displayed]

FINDINGS: Intrauterine gestational sac: Present, single, slightly irregular

Yolk sac: Questionably visualized as small rounded mildly
hyperechoic nodule

Embryo:  Present

Cardiac Activity: Absent

Heart Rate: N/A  bpm

CRL:  15 mm   7 w   6 d                  US EDC: 02/01/2021

Subchorionic hemorrhage:  None definitely visualized

Maternal uterus/adnexae:

Amnion noted.  Maternal uterus otherwise unremarkable.

RIGHT ovary normal size and morphology, 2.1 x 1.8 x 2.2 cm.

LEFT ovary normal size and morphology, 1.7 x 1.2 x 1.7 cm.

No free pelvic fluid or adnexal masses.
IMPRESSION: Intrauterine gestational sac identified containing a fetal pole 15
mm in length.

No fetal cardiac activity identified.

Findings meet definitive criteria for failed pregnancy. This follows
SRU consensus guidelines: Diagnostic Criteria for Nonviable
Pregnancy Early in the First Trimester. N Engl J Med
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
## 2021-06-26 ENCOUNTER — Other Ambulatory Visit: Payer: Self-pay

## 2021-06-26 ENCOUNTER — Encounter: Payer: Self-pay | Admitting: Internal Medicine

## 2021-06-26 ENCOUNTER — Ambulatory Visit (INDEPENDENT_AMBULATORY_CARE_PROVIDER_SITE_OTHER): Payer: BC Managed Care – PPO | Admitting: Internal Medicine

## 2021-06-26 VITALS — BP 114/72 | HR 83 | Temp 98.6°F | Ht 64.0 in | Wt 174.0 lb

## 2021-06-26 DIAGNOSIS — M5442 Lumbago with sciatica, left side: Secondary | ICD-10-CM | POA: Diagnosis not present

## 2021-06-26 DIAGNOSIS — M542 Cervicalgia: Secondary | ICD-10-CM | POA: Insufficient documentation

## 2021-06-26 DIAGNOSIS — R519 Headache, unspecified: Secondary | ICD-10-CM | POA: Diagnosis not present

## 2021-06-26 MED ORDER — MELOXICAM 15 MG PO TABS: 15.0000 mg | ORAL_TABLET | Freq: Every day | ORAL | 0 refills | Status: DC
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
## 2021-06-26 NOTE — Patient Instructions (Signed)
° ° ° °  Medications changes include :   meloxicam 15 mg daily with food.    Your prescription(s) have been submitted to your pharmacy. Please take as directed and contact our office if you believe you are having problem(s) with the medication(s).   A referral was ordered for ortho - Dr Layne Benton.       Someone from their office will call you to schedule an appointment.

## 2021-06-26 NOTE — Assessment & Plan Note (Signed)
Acute left lower back pain with mild left-sided radiculopathy Hopefully this will improve with time-start meloxicam 15 mg daily with food-due for 2 weeks Will refer to orthopedics for further evaluation
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
## 2021-07-28 ENCOUNTER — Encounter: Payer: BC Managed Care – PPO | Admitting: Internal Medicine

## 2021-07-29 ENCOUNTER — Encounter: Payer: Self-pay | Admitting: Internal Medicine

## 2021-07-29 NOTE — Progress Notes (Signed)
? ? ?Subjective:  ? ? Patient ID: Denise [name], female    DOB: 02/19/1976, 45 y.o.   MRN: [id number] ? ? ?This visit occurred during the SARS-CoV-2 public health emergency.  Safety protocols were in place, including screening questions prior to the visit, additional usage of staff PPE, and extensive cleaning of exam room while observing appropriate contact time as indicated for disinfecting solutions. ? ? ? ?HPI ?Elias is here for  ?Chief Complaint  ?Patient presents with  ? Annual Exam  ? ? ?Started having hot flashes at night.  She had these for a while after her son was born and then they went away and has had them intermittently since then.  She has started to have them again recently.  She does see her gynecologist and an annual basis. ? ?Cramping in left lateral foot - mostly at night.  Her left leg pain is better after PT.  She is seeing Dr Layne Benton - has appt this week.  ? ? ? ? ?Medications and allergies reviewed with patient and updated if appropriate. ? ? ? ?Current Outpatient Medications on File Prior to Visit  ?Medication Sig Dispense Refill  ? cetirizine (ZYRTEC ALLERGY) 10 MG tablet Take 1 tablet (10 mg total) by mouth 2 (two) times daily as needed (hives). 60 tablet 5  ? famotidine (PEPCID) 20 MG tablet Take 20 mg by mouth as needed for heartburn or indigestion.    ? fluticasone (FLONASE) 50 MCG/ACT nasal spray Place 1 spray into both nostrils 2 (two) times daily as needed for allergies or rhinitis. 16 g 5  ? olopatadine (PATANOL) 0.1 % ophthalmic solution 2 (two) times daily as needed.  3  ? Prenatal Vit-Fe Fumarate-FA (PRENATAL MULTIVITAMIN) TABS tablet Take 1 tablet by mouth daily at 12 noon.    ? tiZANidine (ZANAFLEX) 4 MG tablet Take 4 mg by mouth at bedtime.    ? TRULANCE 3 MG TABS     ? valACYclovir (VALTREX) 1000 MG tablet Take 1 tablet (1,000 mg total) by mouth daily. 90 tablet 4  ? ?No current facility-administered medications on file prior to visit.  ? ? ?Review of Systems   ?Constitutional:  Negative for fever.  ?Eyes:  Negative for visual disturbance.  ?Respiratory:  Negative for cough, shortness of breath and wheezing.   ?Cardiovascular:  Positive for palpitations (occ skipping heartbeat feeling). Negative for chest pain and leg swelling.  ?Gastrointestinal:  Positive for constipation (with IBS - controlled) and diarrhea (with IBS - controlled). Negative for abdominal pain, blood in stool and nausea.  ?     Ernest Haber  ?Genitourinary:  Negative for dysuria.  ?Musculoskeletal:  Positive for arthralgias (mild at times - hands) and back pain (left lower back).  ?     CTS b/l  ?Skin:  Negative for rash.  ?Neurological:  Positive for dizziness (occ) and headaches (occ). Negative for light-headedness.  ?Psychiatric/Behavioral:  Negative for dysphoric mood. The patient is nervous/anxious.   ? ?   ?Objective:  ? ?Vitals:  ? 07/31/21 1006  ?BP: 112/74  ?Pulse: 84  ?Temp: 98.5 ?F (36.9 ?C)  ?SpO2: 98%  ? ?Filed Weights  ? 07/31/21 1006  ?Weight: 170 lb 9.6 oz (77.4 kg)  ? ?Body mass index is 29.28 kg/m?. ? ?BP Readings from Last 3 Encounters:  ?07/31/21 112/74  ?06/26/21 114/72  ?05/05/21 110/80  ? ? ?Wt Readings from Last 3 Encounters:  ?07/31/21 170 lb 9.6 oz (77.4 kg)  ?06/26/21 174 lb (78.9 kg)  ?05/05/21  169 lb (76.7 kg)  ? ? ?Depression screen [facility] 2/9 03/01/2021 01/27/2021  ?Decreased Interest 0 0  ?Down, Depressed, Hopeless 1 1  ?PHQ - 2 Score 1 1  ?Altered sleeping - 1  ?Tired, decreased energy - 1  ?Change in appetite - 0  ?Feeling bad or failure about yourself  - 1  ?Trouble concentrating - 1  ?Moving slowly or fidgety/restless - 0  ?Suicidal thoughts - 0  ?PHQ-9 Score - 5  ?Difficult doing work/chores - Somewhat difficult  ? ? ? ?GAD 7 : Generalized Anxiety Score 01/27/2021  ?Nervous, Anxious, on Edge 0  ?Control/stop worrying 1  ?Worry too much - different things 1  ?Trouble relaxing 1  ?Restless 0  ?Easily annoyed or irritable 1  ?Afraid - awful might happen 0  ?Total GAD 7 Score 4   ?Anxiety Difficulty Somewhat difficult  ? ? ? ? ?  ?Physical Exam ?Constitutional: She appears well-developed and well-nourished. No distress.  ?HENT:  ?Head: Normocephalic and atraumatic.  ?Right Ear: External ear normal. Normal ear canal and TM ?Left Ear: External ear normal.  Normal ear canal and TM ?Mouth/Throat: Oropharynx is clear and moist.  ?Eyes: Conjunctivae and EOM are normal.  ?Neck: Neck supple. No tracheal deviation present. No thyromegaly present.  ?No carotid bruit  ?Cardiovascular: Normal rate, regular rhythm and normal heart sounds.   ?No murmur heard.  No edema. ?Pulmonary/Chest: Effort normal and breath sounds normal. No respiratory distress. She has no wheezes. She has no rales.  ?Breast: deferred   ?Abdominal: Soft. She exhibits no distension. There is no tenderness.  ?Lymphadenopathy: She has no cervical adenopathy.  ?Skin: Skin is warm and dry. She is not diaphoretic.  ?Psychiatric: She has a normal mood and affect. Her behavior is normal.  ? ? ? ?Lab Results  ?Component Value Date  ? WBC 4.3 01/27/2021  ? HGB 13.7 01/27/2021  ? HCT 39.3 01/27/2021  ? PLT 269.0 01/27/2021  ? GLUCOSE 88 01/27/2021  ? CHOL 253 (H) 01/27/2021  ? TRIG 85.0 01/27/2021  ? HDL 80.40 01/27/2021  ? LDLCALC 155 (H) 01/27/2021  ? ALT 12 01/27/2021  ? AST 17 01/27/2021  ? NA 138 01/27/2021  ? K 3.6 01/27/2021  ? CL 100 01/27/2021  ? CREATININE 0.82 01/27/2021  ? BUN 11 01/27/2021  ? CO2 30 01/27/2021  ? TSH 1.670 07/26/2020  ? INR 1.0 07/26/2020  ? HGBA1C 5.4 01/27/2021  ? ? ? ? ?   ?Assessment & Plan:  ? ?Physical exam: ?Screening blood work  ordered ?Exercise  started pilate's at home ?Weight  working on weight loss ?Substance abuse  none ? ? ?Reviewed recommended immunizations. ? ? ?Health Maintenance  ?Topic Date Due  ? Hepatitis C Screening  Never done  ? COVID-19 Vaccine (3 - Booster for Moderna series) 08/16/2021 (Originally 11/24/2019)  ? INFLUENZA VACCINE  08/25/2021 (Originally 12/26/2020)  ? TETANUS/TDAP   07/16/2023  ? PAP SMEAR-Modifier  03/01/2024  ? HIV Screening  Completed  ? HPV VACCINES  Aged Out  ?  ? ? ? ? ? ? ?See Problem List for Assessment and Plan of chronic medical problems. ? ? ? ? ?

## 2021-07-29 NOTE — Patient Instructions (Addendum)
? ? ? ?Blood work was ordered.   ? ? ?Medications changes include :   none ? ? ?Your prescription(s) have been sent to your pharmacy.  ? ? ? ?Return in about 1 year (around 08/01/2022) for CPE. ? ? ? ?Health Maintenance, Female ?Adopting a healthy lifestyle and getting preventive care are important in promoting health and wellness. Ask your health care provider about: ?The right schedule for you to have regular tests and exams. ?Things you can do on your own to prevent diseases and keep yourself healthy. ?What should I know about diet, weight, and exercise? ?Eat a healthy diet ? ?Eat a diet that includes plenty of vegetables, fruits, low-fat dairy products, and lean protein. ?Do not eat a lot of foods that are high in solid fats, added sugars, or sodium. ?Maintain a healthy weight ?Body mass index (BMI) is used to identify weight problems. It estimates body fat based on height and weight. Your health care provider can help determine your BMI and help you achieve or maintain a healthy weight. ?Get regular exercise ?Get regular exercise. This is one of the most important things you can do for your health. Most adults should: ?Exercise for at least 150 minutes each week. The exercise should increase your heart rate and make you sweat (moderate-intensity exercise). ?Do strengthening exercises at least twice a week. This is in addition to the moderate-intensity exercise. ?Spend less time sitting. Even light physical activity can be beneficial. ?Watch cholesterol and blood lipids ?Have your blood tested for lipids and cholesterol at 44 years of age, then have this test every 5 years. ?Have your cholesterol levels checked more often if: ?Your lipid or cholesterol levels are high. ?You are older than 44 years of age. ?You are at high risk for heart disease. ?What should I know about cancer screening? ?Depending on your health history and family history, you may need to have cancer screening at various ages. This may include  screening for: ?Breast cancer. ?Cervical cancer. ?Colorectal cancer. ?Skin cancer. ?Lung cancer. ?What should I know about heart disease, diabetes, and high blood pressure? ?Blood pressure and heart disease ?High blood pressure causes heart disease and increases the risk of stroke. This is more likely to develop in people who have high blood pressure readings or are overweight. ?Have your blood pressure checked: ?Every 3-5 years if you are 97-60 years of age. ?Every year if you are 15 years old or older. ?Diabetes ?Have regular diabetes screenings. This checks your fasting blood sugar level. Have the screening done: ?Once every three years after age 20 if you are at a normal weight and have a low risk for diabetes. ?More often and at a younger age if you are overweight or have a high risk for diabetes. ?What should I know about preventing infection? ?Hepatitis B ?If you have a higher risk for hepatitis B, you should be screened for this virus. Talk with your health care provider to find out if you are at risk for hepatitis B infection. ?Hepatitis C ?Testing is recommended for: ?Everyone born from [birth year] through 1963. ?Anyone with known risk factors for hepatitis C. ?Sexually transmitted infections (STIs) ?Get screened for STIs, including gonorrhea and chlamydia, if: ?You are sexually active and are younger than 44 years of age. ?You are older than 44 years of age and your health care provider tells you that you are at risk for this type of infection. ?Your sexual activity has changed since you were last screened, and you are  at increased risk for chlamydia or gonorrhea. Ask your health care provider if you are at risk. ?Ask your health care provider about whether you are at high risk for HIV. Your health care provider may recommend a prescription medicine to help prevent HIV infection. If you choose to take medicine to prevent HIV, you should first get tested for HIV. You should then be tested every 3 months for as  long as you are taking the medicine. ?Pregnancy ?If you are about to stop having your period (premenopausal) and you may become pregnant, seek counseling before you get pregnant. ?Take 400 to 800 micrograms (mcg) of folic acid every day if you become pregnant. ?Ask for birth control (contraception) if you want to prevent pregnancy. ?Osteoporosis and menopause ?Osteoporosis is a disease in which the bones lose minerals and strength with aging. This can result in bone fractures. If you are 39 years old or older, or if you are at risk for osteoporosis and fractures, ask your health care provider if you should: ?Be screened for bone loss. ?Take a calcium or vitamin D supplement to lower your risk of fractures. ?Be given hormone replacement therapy (HRT) to treat symptoms of menopause. ?Follow these instructions at home: ?Alcohol use ?Do not drink alcohol if: ?Your health care provider tells you not to drink. ?You are pregnant, may be pregnant, or are planning to become pregnant. ?If you drink alcohol: ?Limit how much you have to: ?0-1 drink a day. ?Know how much alcohol is in your drink. In the [place] one drink equals one 12 oz bottle of beer (355 mL), one 5 oz glass of wine (148 mL), or one 1? oz glass of hard liquor (44 mL). ?Lifestyle ?Do not use any products that contain nicotine or tobacco. These products include cigarettes, chewing tobacco, and vaping devices, such as e-cigarettes. If you need help quitting, ask your health care provider. ?Do not use street drugs. ?Do not share needles. ?Ask your health care provider for help if you need support or information about quitting drugs. ?General instructions ?Schedule regular health, dental, and eye exams. ?Stay current with your vaccines. ?Tell your health care provider if: ?You often feel depressed. ?You have ever been abused or do not feel safe at home. ?Summary ?Adopting a healthy lifestyle and getting preventive care are important in promoting health and  wellness. ?Follow your health care provider's instructions about healthy diet, exercising, and getting tested or screened for diseases. ?Follow your health care provider's instructions on monitoring your cholesterol and blood pressure. ?This information is not intended to replace advice given to you by your health care provider. Make sure you discuss any questions you have with your health care provider. ?Document Revised: 10/03/2020 Document Reviewed: 10/03/2020 ?Elsevier Patient Education ? [place]. ? ?

## 2021-07-31 ENCOUNTER — Ambulatory Visit (INDEPENDENT_AMBULATORY_CARE_PROVIDER_SITE_OTHER): Payer: BC Managed Care – PPO | Admitting: Internal Medicine

## 2021-07-31 ENCOUNTER — Other Ambulatory Visit: Payer: Self-pay

## 2021-07-31 VITALS — BP 112/74 | HR 84 | Temp 98.5°F | Ht 64.0 in | Wt 170.6 lb

## 2021-07-31 DIAGNOSIS — K582 Mixed irritable bowel syndrome: Secondary | ICD-10-CM

## 2021-07-31 DIAGNOSIS — I10 Essential (primary) hypertension: Secondary | ICD-10-CM | POA: Diagnosis not present

## 2021-07-31 DIAGNOSIS — K219 Gastro-esophageal reflux disease without esophagitis: Secondary | ICD-10-CM | POA: Diagnosis not present

## 2021-07-31 DIAGNOSIS — E782 Mixed hyperlipidemia: Secondary | ICD-10-CM | POA: Diagnosis not present

## 2021-07-31 DIAGNOSIS — F419 Anxiety disorder, unspecified: Secondary | ICD-10-CM

## 2021-07-31 DIAGNOSIS — Z0000 Encounter for general adult medical examination without abnormal findings: Secondary | ICD-10-CM | POA: Diagnosis not present

## 2021-07-31 DIAGNOSIS — F988 Other specified behavioral and emotional disorders with onset usually occurring in childhood and adolescence: Secondary | ICD-10-CM | POA: Insufficient documentation

## 2021-07-31 DIAGNOSIS — Z1159 Encounter for screening for other viral diseases: Secondary | ICD-10-CM

## 2021-07-31 LAB — CBC WITH DIFFERENTIAL/PLATELET
Basophils Absolute: 0 10*3/uL (ref 0.0–0.1)
Basophils Relative: 0.8 % (ref 0.0–3.0)
Eosinophils Absolute: 0 10*3/uL (ref 0.0–0.7)
Eosinophils Relative: 0.7 % (ref 0.0–5.0)
HCT: 39.4 % (ref 36.0–46.0)
Hemoglobin: 13.8 g/dL (ref 12.0–15.0)
Lymphocytes Relative: 41.8 % (ref 12.0–46.0)
Lymphs Abs: 1.9 10*3/uL (ref 0.7–4.0)
MCHC: 35.1 g/dL (ref 30.0–36.0)
MCV: 83.4 fl (ref 78.0–100.0)
Monocytes Absolute: 0.4 10*3/uL (ref 0.1–1.0)
Monocytes Relative: 9.6 % (ref 3.0–12.0)
Neutro Abs: 2.1 10*3/uL (ref 1.4–7.7)
Neutrophils Relative %: 47.1 % (ref 43.0–77.0)
Platelets: 261 10*3/uL (ref 150.0–400.0)
RBC: 4.72 Mil/uL (ref 3.87–5.11)
RDW: 13.6 % (ref 11.5–15.5)
WBC: 4.5 10*3/uL (ref 4.0–10.5)

## 2021-07-31 LAB — LIPID PANEL
Cholesterol: 257 mg/dL — ABNORMAL HIGH (ref 0–200)
HDL: 86.1 mg/dL (ref >39.00)
LDL Cholesterol: 160 mg/dL — ABNORMAL HIGH (ref 0–99)
NonHDL: 170.9
Total CHOL/HDL Ratio: 3
Triglycerides: 56 mg/dL (ref 0.0–149.0)
VLDL: 11.2 mg/dL (ref 0.0–40.0)

## 2021-07-31 LAB — COMPREHENSIVE METABOLIC PANEL
ALT: 16 U/L (ref 0–35)
AST: 23 U/L (ref 0–37)
Albumin: 4.7 g/dL (ref 3.5–5.2)
Alkaline Phosphatase: 63 U/L (ref 39–117)
BUN: 13 mg/dL (ref 6–23)
CO2: 29 mEq/L (ref 19–32)
Calcium: 9.6 mg/dL (ref 8.4–10.5)
Chloride: 99 mEq/L (ref 96–112)
Creatinine, Ser: 0.89 mg/dL (ref 0.40–1.20)
GFR: 79.11 mL/min (ref >60.00)
Glucose, Bld: 94 mg/dL (ref 70–99)
Potassium: 3.1 mEq/L — ABNORMAL LOW (ref 3.5–5.1)
Sodium: 138 mEq/L (ref 135–145)
Total Bilirubin: 1.4 mg/dL — ABNORMAL HIGH (ref 0.2–1.2)
Total Protein: 7.5 g/dL (ref 6.0–8.3)

## 2021-07-31 LAB — TSH: TSH: 1.22 u[IU]/mL (ref 0.35–5.50)

## 2021-07-31 MED ORDER — INDAPAMIDE 1.25 MG PO TABS: 1.2500 mg | ORAL_TABLET | Freq: Every day | ORAL | 2 refills | Status: DC

## 2021-07-31 MED ORDER — NIFEDIPINE ER OSMOTIC RELEASE 60 MG PO TB24: 60.0000 mg | ORAL_TABLET | Freq: Every day | ORAL | 2 refills | Status: DC

## 2021-07-31 NOTE — Assessment & Plan Note (Signed)
Chronic ?Was on medication as ?Discussed possible side effects of the medication-can restart if she really feels like she needs it, but ideally it would be best to avoid this due to the possible side effects ?She will think about this and let me know ?

## 2021-07-31 NOTE — Assessment & Plan Note (Signed)
Chronic ?GERD controlled ?Continue  Famotidine 20 mg daily prn ?

## 2021-07-31 NOTE — Assessment & Plan Note (Signed)
Chronic ?Blood pressure well controlled ?CMP ?Continue nifedipine xl 60 mg daily,indapamide 1/25 mg daily ?

## 2021-07-31 NOTE — Assessment & Plan Note (Signed)
Chronic Check lipid panel  Lifestyle controlled Regular exercise and healthy diet encouraged  

## 2021-07-31 NOTE — Assessment & Plan Note (Signed)
Chronic ?colonoscopy 2021 ?Sees GI  ?Taking trulance ?

## 2021-08-01 LAB — HEPATITIS C ANTIBODY
Hepatitis C Ab: NONREACTIVE
SIGNAL TO CUT-OFF: 0.09 (ref <1.00)

## 2021-08-02 ENCOUNTER — Other Ambulatory Visit: Payer: Self-pay | Admitting: Sports Medicine

## 2021-08-02 DIAGNOSIS — M5416 Radiculopathy, lumbar region: Secondary | ICD-10-CM

## 2021-08-08 NOTE — Progress Notes (Signed)
? ?Follow Up Note ? ?RE: Denise [name] MRN: [id number] DOB: 22-Oct-1975 ?Date of Office Visit: 08/09/2021 ? ?Referring provider: Lucianne Lei, MD ?Primary care provider: Binnie Rail, MD ? ?Chief Complaint: Chronic Uritcaria (Yearly - Pretty good) and Perennial Allergic Rhinitis (Yearly -Up and down due to the weather) ? ?History of Present Illness: ?I had the pleasure of seeing Denise [name] for a follow up visit at the [facility] on 08/09/2021. She is a 46 y.o. female, who is being followed for chronic urticaria and allergic rhinitis. Her previous allergy office visit was on 08/08/2020 with Dr. Maudie Mercury. Today is a regular follow up visit. ? ?Chronic urticaria ?Some itching over the weekend but no hives. ?Takes zyrtec '10mg'$  daily as needed when she feels itchy with good benefit. ? ?Perennial allergic rhinitis ?Noted some itchy eyes with the warm weather. ?Takes zyrtec and Flonase as needed with good benefit.  ? ?Assessment and Plan: ?Denise [name] is a 46 y.o. female with: ?Chronic urticaria ?Past history - Pruritic hives/welts on and off for the past 10 months. Seemed to be worse since she had miscarriage and seem to occur more frequently during her menses. Positive dermatographism on exam. Bloodwork unremarkable - TSH, CMP, CBC with Diff, ANA w/Reflex ?Interim history - no hives but had some itching at times and takes zyrtec prn with good benefit.  ?May take zyrtec '10mg'$  1-2 times a day as needed.  ?Avoid the following potential triggers: alcohol, tight clothing, NSAIDs.  ?Keep track of episodes and take pictures.  ? ?Perennial allergic rhinitis ?Past history - Rhinitis symptoms for the past 5 years mainly during the spring and summer.  Using Singulair and Allegra as needed with good benefit. 2019 skin testing was positive to dust mites and cats.  ?Interim history - itchy eyes.  ?Continue environmental control measures.  ?Use over the counter antihistamines such as Zyrtec (cetirizine), Claritin (loratadine),  Allegra (fexofenadine), or Xyzal (levocetirizine) daily as needed. May take twice a day during allergy flares. May switch antihistamines every few months. ?Use Flonase (fluticasone) nasal spray 1 spray per nostril twice a day as needed for nasal congestion.  ?Use olopatadine eye drops 0.2% once a day as needed for itchy/watery eyes. ?Do not put over the contact lens and wait about 10-15 minutes before putting contact lens in.  ? ?Generalized headache ?If no improvement follow up with PCP.  ? ?Return in about 1 year (around 08/10/2022). ? ?Meds ordered this encounter  ?Medications  ? fluticasone (FLONASE) 50 MCG/ACT nasal spray  ?  Sig: Place 1 spray into both nostrils 2 (two) times daily as needed for allergies or rhinitis.  ?  Dispense:  16 g  ?  Refill:  5  ? Olopatadine HCl 0.2 % SOLN  ?  Sig: Apply 1 drop to eye daily as needed (itchy/watery eyes).  ?  Dispense:  2.5 mL  ?  Refill:  5  ? ?Lab Orders  ?No laboratory test(s) ordered today  ? ? ?Diagnostics: ?None.  ? ?Medication List:  ?Current Outpatient Medications  ?Medication Sig Dispense Refill  ? cetirizine (ZYRTEC ALLERGY) 10 MG tablet Take 1 tablet (10 mg total) by mouth 2 (two) times daily as needed (hives). 60 tablet 5  ? famotidine (PEPCID) 20 MG tablet Take 20 mg by mouth as needed for heartburn or indigestion.    ? fluticasone (FLONASE) 50 MCG/ACT nasal spray Place 1 spray into both nostrils 2 (two) times daily as needed for allergies or rhinitis. 16 g  5  ? indapamide (LOZOL) 1.25 MG tablet Take 1 tablet (1.25 mg total) by mouth daily. 90 tablet 2  ? NIFEdipine (PROCARDIA XL/NIFEDICAL XL) 60 MG 24 hr tablet Take 1 tablet (60 mg total) by mouth daily. 90 tablet 2  ? Olopatadine HCl 0.2 % SOLN Apply 1 drop to eye daily as needed (itchy/watery eyes). 2.5 mL 5  ? Prenatal Vit-Fe Fumarate-FA (PRENATAL MULTIVITAMIN) TABS tablet Take 1 tablet by mouth daily at 12 noon.    ? tiZANidine (ZANAFLEX) 4 MG tablet Take 4 mg by mouth at bedtime.    ? TRULANCE 3 MG  TABS     ? valACYclovir (VALTREX) 1000 MG tablet Take 1 tablet (1,000 mg total) by mouth daily. 90 tablet 4  ? ?No current facility-administered medications for this visit.  ? ?Allergies: ?Allergies  ?Allergen Reactions  ? Ace Inhibitors   ?  Swelling og tongue and lips  ? Adhesive [Tape] Rash  ? Latex Itching and Rash  ?  Rash and itching from use of gloves  ? ?I reviewed her past medical history, social history, family history, and environmental history and no significant changes have been reported from her previous visit. ? ?Review of Systems  ?Constitutional:  Negative for appetite change, chills, fever and unexpected weight change.  ?HENT:  Positive for sneezing. Negative for congestion, rhinorrhea and sinus pressure.   ?Eyes:  Positive for itching.  ?Respiratory:  Negative for cough, chest tightness, shortness of breath and wheezing.   ?Cardiovascular:  Negative for chest pain.  ?Gastrointestinal:  Negative for abdominal pain.  ?Genitourinary:  Negative for difficulty urinating.  ?Skin:  Negative for rash.  ?Allergic/Immunologic: Positive for environmental allergies. Negative for food allergies.  ?Neurological:  Positive for headaches.  ? ?Objective: ?BP 126/82   Pulse 92   Temp (!) 96 ?F (35.6 ?C)   Resp 18   Ht '5\' 4"'$  (1.626 m)   Wt 171 lb 9.6 oz (77.8 kg)   SpO2 99%   BMI 29.46 kg/m?  ?Body mass index is 29.46 kg/m?Marland Kitchen ?Physical Exam ?Vitals and nursing note reviewed.  ?Constitutional:   ?   Appearance: Normal appearance. She is well-developed.  ?HENT:  ?   Head: Normocephalic and atraumatic.  ?   Right Ear: Tympanic membrane and external ear normal.  ?   Left Ear: External ear normal. There is impacted cerumen.  ?   Nose: Nose normal.  ?Eyes:  ?   Conjunctiva/sclera: Conjunctivae normal.  ?Cardiovascular:  ?   Rate and Rhythm: Normal rate and regular rhythm.  ?   Heart sounds: Normal heart sounds. No murmur heard. ?  No friction rub. No gallop.  ?Pulmonary:  ?   Effort: Pulmonary effort is normal.  ?    Breath sounds: Normal breath sounds. No wheezing or rales.  ?Musculoskeletal:  ?   Cervical back: Neck supple.  ?Skin: ?   General: Skin is warm.  ?   Findings: No rash.  ?Neurological:  ?   Mental Status: She is alert and oriented to person, place, and time.  ?Psychiatric:     ?   Behavior: Behavior normal.  ?Previous notes and tests were reviewed. ?The plan was reviewed with the patient/family, and all questions/concerned were addressed. ? ?It was my pleasure to see Denise [name] today and participate in her care. Please feel free to contact me with any questions or concerns. ? ?Sincerely, ? ?Rexene Alberts, DO ?Allergy & Immunology ? ?[facility] ?[place] office: [phone] ?  [place] office: [phone] ?

## 2021-08-09 ENCOUNTER — Ambulatory Visit (INDEPENDENT_AMBULATORY_CARE_PROVIDER_SITE_OTHER): Payer: BC Managed Care – PPO | Admitting: Allergy

## 2021-08-09 ENCOUNTER — Telehealth: Payer: Self-pay | Admitting: Internal Medicine

## 2021-08-09 ENCOUNTER — Encounter: Payer: Self-pay | Admitting: Allergy

## 2021-08-09 ENCOUNTER — Other Ambulatory Visit: Payer: Self-pay

## 2021-08-09 VITALS — BP 126/82 | HR 92 | Temp 96.0°F | Resp 18 | Ht 64.0 in | Wt 171.6 lb

## 2021-08-09 DIAGNOSIS — R519 Headache, unspecified: Secondary | ICD-10-CM

## 2021-08-09 DIAGNOSIS — L508 Other urticaria: Secondary | ICD-10-CM | POA: Diagnosis not present

## 2021-08-09 DIAGNOSIS — J3089 Other allergic rhinitis: Secondary | ICD-10-CM

## 2021-08-09 MED ORDER — OLOPATADINE HCL 0.2 % OP SOLN: 1.0000 [drp] | Freq: Every day | OPHTHALMIC | 5 refills | Status: DC | PRN

## 2021-08-09 MED ORDER — FLUTICASONE PROPIONATE 50 MCG/ACT NA SUSP: 1.0000 | Freq: Two times a day (BID) | NASAL | 5 refills | Status: DC | PRN

## 2021-08-09 NOTE — Telephone Encounter (Signed)
Pt states provider agreed to accept her spouse as a new pt ? ?Ok to schedule? ?

## 2021-08-09 NOTE — Assessment & Plan Note (Signed)
Past history - Rhinitis symptoms for the past 5 years mainly during the spring and summer.  Using Singulair and Allegra as needed with good benefit. 2019 skin testing was positive to dust mites and cats.  ?Interim history - itchy eyes.  ?? Continue environmental control measures.  ?? Use over the counter antihistamines such as Zyrtec (cetirizine), Claritin (loratadine), Allegra (fexofenadine), or Xyzal (levocetirizine) daily as needed. May take twice a day during allergy flares. May switch antihistamines every few months. ?? Use Flonase (fluticasone) nasal spray 1 spray per nostril twice a day as needed for nasal congestion.  ?? Use olopatadine eye drops 0.2% once a day as needed for itchy/watery eyes. ?? Do not put over the contact lens and wait about 10-15 minutes before putting contact lens in.  ?

## 2021-08-09 NOTE — Telephone Encounter (Signed)
Okay to schedule

## 2021-08-09 NOTE — Assessment & Plan Note (Signed)
?   If no improvement follow up with PCP.  ?

## 2021-08-09 NOTE — Assessment & Plan Note (Signed)
Past history - Pruritic hives/welts on and off for the past 10 months. Seemed to be worse since she had miscarriage and seem to occur more frequently during her menses. Positive dermatographism on exam. Bloodwork unremarkable - TSH, CMP, CBC with Diff, ANA w/Reflex ?Interim history - no hives but had some itching at times and takes zyrtec prn with good benefit.  ?? May take zyrtec '10mg'$  1-2 times a day as needed.  ?? Avoid the following potential triggers: alcohol, tight clothing, NSAIDs.  ?? Keep track of episodes and take pictures.  ?

## 2021-08-09 NOTE — Patient Instructions (Addendum)
Perennial allergic rhinitis ?2019 skin testing was positive to dust mites and cats.  ?Continue environmental control measures.  ?Use over the counter antihistamines such as Zyrtec (cetirizine), Claritin (loratadine), Allegra (fexofenadine), or Xyzal (levocetirizine) daily as needed. May take twice a day during allergy flares. May switch antihistamines every few months. ?Use Flonase (fluticasone) nasal spray 1 spray per nostril twice a day as needed for nasal congestion.  ?Use olopatadine eye drops 0.2% once a day as needed for itchy/watery eyes. ?Do not put over the contact lens and wait about 10-15 minutes before putting contact lens in.  ? ?Chronic urticaria ?May take zyrtec '10mg'$  1-2 times a day as needed.  ?Avoid the following potential triggers: alcohol, tight clothing, NSAIDs.  ?Keep track of episodes and take pictures.  ? ?Headaches ?If no improvement follow up with PCP.  ? ?Follow up in 1 year or sooner if needed.  ?

## 2021-08-10 NOTE — Telephone Encounter (Signed)
LVM w/ office number to get spouse scheduled for new pt appt, ok to schedule per provider ?

## 2021-08-12 ENCOUNTER — Ambulatory Visit
Admission: RE | Admit: 2021-08-12 | Discharge: 2021-08-12 | Disposition: A | Discharge status: Home / Self Care | Payer: BC Managed Care – PPO | Source: Ambulatory Visit | Attending: Sports Medicine | Admitting: Sports Medicine

## 2021-08-12 ENCOUNTER — Other Ambulatory Visit: Payer: Self-pay

## 2021-08-12 DIAGNOSIS — M5416 Radiculopathy, lumbar region: Secondary | ICD-10-CM

## 2021-09-21 DIAGNOSIS — R252 Cramp and spasm: Secondary | ICD-10-CM | POA: Insufficient documentation

## 2021-09-21 NOTE — Progress Notes (Signed)
? ? ?Subjective:  ? ? Patient ID: Denise [name], female    DOB: 07-07-1975, 46 y.o.   MRN: [id number] ? ?This visit occurred during the SARS-CoV-2 public health emergency.  Safety protocols were in place, including screening questions prior to the visit, additional usage of staff PPE, and extensive cleaning of exam room while observing appropriate contact time as indicated for disinfecting solutions. ? ? ? ?HPI ?[name] is here for  ?Chief Complaint  ?Patient presents with  ? Charley horses  ?  Charley horses at night; also wants to mono (son was previously diagnosed with it)  ? ? ? ?Muscle cramping legs and feet constantly - happening at night and wakes her up.  In her feet or lower legs.  It alternates feet/legs.  At times she may feel it coming on during the day.   ? ?Yesterday felt a pulse in her left medial knee region.  She is having a lot of swelling in her feet and ankle.   ? ? ? ? ?Medications and allergies reviewed with patient and updated if appropriate. ? ?Current Outpatient Medications on File Prior to Visit  ?Medication Sig Dispense Refill  ? cetirizine (ZYRTEC ALLERGY) 10 MG tablet Take 1 tablet (10 mg total) by mouth 2 (two) times daily as needed (hives). 60 tablet 5  ? famotidine (PEPCID) 20 MG tablet Take 20 mg by mouth as needed for heartburn or indigestion.    ? fluticasone (FLONASE) 50 MCG/ACT nasal spray Place 1 spray into both nostrils 2 (two) times daily as needed for allergies or rhinitis. 16 g 5  ? indapamide (LOZOL) 1.25 MG tablet Take 1 tablet (1.25 mg total) by mouth daily. 90 tablet 2  ? NIFEdipine (PROCARDIA XL/NIFEDICAL XL) 60 MG 24 hr tablet Take 1 tablet (60 mg total) by mouth daily. 90 tablet 2  ? Olopatadine HCl 0.2 % SOLN Apply 1 drop to eye daily as needed (itchy/watery eyes). 2.5 mL 5  ? Prenatal Vit-Fe Fumarate-FA (PRENATAL MULTIVITAMIN) TABS tablet Take 1 tablet by mouth daily at 12 noon.    ? tiZANidine (ZANAFLEX) 4 MG tablet Take 4 mg by mouth at bedtime.    ? TRULANCE 3 MG  TABS     ? valACYclovir (VALTREX) 1000 MG tablet Take 1 tablet (1,000 mg total) by mouth daily. 90 tablet 4  ? ?No current facility-administered medications on file prior to visit.  ? ? ?Review of Systems  ?Cardiovascular:  Positive for leg swelling. Negative for chest pain and palpitations.  ?Neurological:  Positive for headaches. Negative for facial asymmetry and light-headedness.  ? ?   ?Objective:  ? ?Vitals:  ? 09/22/21 1045  ?BP: 112/72  ?Pulse: 76  ?Temp: 98.3 ?F (36.8 ?C)  ?SpO2: 98%  ? ?BP Readings from Last 3 Encounters:  ?09/22/21 112/72  ?08/09/21 126/82  ?07/31/21 112/74  ? ?Wt Readings from Last 3 Encounters:  ?09/22/21 165 lb (74.8 kg)  ?08/09/21 171 lb 9.6 oz (77.8 kg)  ?07/31/21 170 lb 9.6 oz (77.4 kg)  ? ?Body mass index is 28.32 kg/m?. ? ?  ?Physical Exam ?Constitutional:   ?   General: She is not in acute distress. ?   Appearance: Normal appearance.  ?HENT:  ?   Head: Normocephalic and atraumatic.  ?Eyes:  ?   Conjunctiva/sclera: Conjunctivae normal.  ?Cardiovascular:  ?   Rate and Rhythm: Normal rate and regular rhythm.  ?   Heart sounds: Normal heart sounds. No murmur heard. ?Pulmonary:  ?   Effort: Pulmonary  effort is normal. No respiratory distress.  ?   Breath sounds: Normal breath sounds. No wheezing.  ?Musculoskeletal:  ?   Cervical back: Neck supple.  ?   Right lower leg: No edema.  ?   Left lower leg: No edema.  ?Lymphadenopathy:  ?   Cervical: No cervical adenopathy.  ?Skin: ?   General: Skin is warm and dry.  ?   Findings: Bruising (Bilateral lower extremities-few scattered bruises bilaterally) present. No rash.  ?Neurological:  ?   Mental Status: She is alert. Mental status is at baseline.  ?Psychiatric:     ?   Mood and Affect: Mood normal.     ?   Behavior: Behavior normal.  ? ?   ? ? ? ? ? ?Assessment & Plan:  ? ? ?See Problem List for Assessment and Plan of chronic medical problems.  ? ? ? ? ?

## 2021-09-21 NOTE — Patient Instructions (Addendum)
? ? ? ?  Blood work was ordered.  To be done next week or the week after ? ? ?Medications changes include :   potassium pill daily ? ? ?Your prescription(s) have been sent to your pharmacy.  ? ? ?A referral was ordered for Dr Lorenso Courier.     Someone from that office will call you to schedule an appointment.  ? ? ?Return for schedule lab appt.  ?

## 2021-09-22 ENCOUNTER — Encounter: Payer: Self-pay | Admitting: Internal Medicine

## 2021-09-22 ENCOUNTER — Ambulatory Visit (INDEPENDENT_AMBULATORY_CARE_PROVIDER_SITE_OTHER): Payer: BC Managed Care – PPO | Admitting: Internal Medicine

## 2021-09-22 VITALS — BP 112/72 | HR 76 | Temp 98.3°F | Ht 64.0 in | Wt 165.0 lb

## 2021-09-22 DIAGNOSIS — R233 Spontaneous ecchymoses: Secondary | ICD-10-CM

## 2021-09-22 DIAGNOSIS — R252 Cramp and spasm: Secondary | ICD-10-CM

## 2021-09-22 DIAGNOSIS — I10 Essential (primary) hypertension: Secondary | ICD-10-CM

## 2021-09-22 DIAGNOSIS — K219 Gastro-esophageal reflux disease without esophagitis: Secondary | ICD-10-CM

## 2021-09-22 DIAGNOSIS — K582 Mixed irritable bowel syndrome: Secondary | ICD-10-CM

## 2021-09-22 LAB — BASIC METABOLIC PANEL
BUN: 12 mg/dL (ref 6–23)
CO2: 32 mEq/L (ref 19–32)
Calcium: 9.6 mg/dL (ref 8.4–10.5)
Chloride: 97 mEq/L (ref 96–112)
Creatinine, Ser: 0.89 mg/dL (ref 0.40–1.20)
GFR: 79.03 mL/min (ref >60.00)
Glucose, Bld: 87 mg/dL (ref 70–99)
Potassium: 3.6 mEq/L (ref 3.5–5.1)
Sodium: 138 mEq/L (ref 135–145)

## 2021-09-22 LAB — CBC WITH DIFFERENTIAL/PLATELET
Basophils Absolute: 0 10*3/uL (ref 0.0–0.1)
Basophils Relative: 0.6 % (ref 0.0–3.0)
Eosinophils Absolute: 0 10*3/uL (ref 0.0–0.7)
Eosinophils Relative: 0.6 % (ref 0.0–5.0)
HCT: 39.6 % (ref 36.0–46.0)
Hemoglobin: 13.8 g/dL (ref 12.0–15.0)
Lymphocytes Relative: 35.3 % (ref 12.0–46.0)
Lymphs Abs: 2 10*3/uL (ref 0.7–4.0)
MCHC: 34.9 g/dL (ref 30.0–36.0)
MCV: 84.2 fl (ref 78.0–100.0)
Monocytes Absolute: 0.6 10*3/uL (ref 0.1–1.0)
Monocytes Relative: 10.5 % (ref 3.0–12.0)
Neutro Abs: 3 10*3/uL (ref 1.4–7.7)
Neutrophils Relative %: 53 % (ref 43.0–77.0)
Platelets: 280 10*3/uL (ref 150.0–400.0)
RBC: 4.7 Mil/uL (ref 3.87–5.11)
RDW: 13.6 % (ref 11.5–15.5)
WBC: 5.7 10*3/uL (ref 4.0–10.5)

## 2021-09-22 LAB — IBC PANEL
Iron: 103 ug/dL (ref 42–145)
Saturation Ratios: 30 % (ref 20.0–50.0)
TIBC: 343 ug/dL (ref 250.0–450.0)
Transferrin: 245 mg/dL (ref 212.0–360.0)

## 2021-09-22 LAB — MAGNESIUM: Magnesium: 1.9 mg/dL (ref 1.5–2.5)

## 2021-09-22 LAB — FERRITIN: Ferritin: 30.4 ng/mL (ref 10.0–291.0)

## 2021-09-22 MED ORDER — POTASSIUM CHLORIDE CRYS ER 20 MEQ PO TBCR: 20.0000 meq | EXTENDED_RELEASE_TABLET | Freq: Every day | ORAL | 3 refills | Status: DC

## 2021-09-22 NOTE — Assessment & Plan Note (Signed)
Needs new at gastroenterologist ?Referred to Dr. Lorenso Courier ?

## 2021-09-22 NOTE — Assessment & Plan Note (Signed)
New ?Having intermittent nocturnal cramping in feet and legs ?Discussed possible causes ?She is drinking a good amount of water-continue ?Likely related to indapamide and/or low potassium, especially since potassium was on the low side with her last blood work ?Start potassium chloride 20 mill equivalents daily ?Continue current medications including indapamide 1.25 mg daily ?Check BMP, magnesium next week ?

## 2021-09-22 NOTE — Assessment & Plan Note (Signed)
Needs new gastroenterologist ?Referred to Dr. Lorenso Courier ?

## 2021-09-22 NOTE — Assessment & Plan Note (Signed)
Chronic ?Blood pressure well controlled ?Continue indapamide 1.25 mg daily, nifedipine 60 mg daily ?

## 2021-09-22 NOTE — Assessment & Plan Note (Signed)
New ?States easy bruising ?Likely related to mild trauma ?Will check CBC, iron panel ?

## 2021-10-05 ENCOUNTER — Encounter (HOSPITAL_BASED_OUTPATIENT_CLINIC_OR_DEPARTMENT_OTHER): Payer: Self-pay | Admitting: Obstetrics & Gynecology

## 2021-10-05 ENCOUNTER — Ambulatory Visit (INDEPENDENT_AMBULATORY_CARE_PROVIDER_SITE_OTHER): Payer: BC Managed Care – PPO | Admitting: Obstetrics & Gynecology

## 2021-10-05 VITALS — BP 110/77 | HR 83 | Ht 63.75 in | Wt 169.2 lb

## 2021-10-05 DIAGNOSIS — N926 Irregular menstruation, unspecified: Secondary | ICD-10-CM

## 2021-10-05 MED ORDER — NORETHINDRONE ACETATE 5 MG PO TABS: ORAL_TABLET | ORAL | 0 refills | Status: DC

## 2021-10-05 NOTE — Progress Notes (Signed)
GYNECOLOGY  VISIT ? ?CC:   irregular bleeding ? ?HPI: ?46 y.o. G60P1021 Married Denise [name] here for complaints of cycles that are about every 35 days.  LMP was 4/26.  This lasted until 5/1.  She then started spotting again on Monday, 5/8, but this has turned into a regular cycle with heavier flow yesterday.  Today she feels like her bleeding is some improved today.  She's had some LLQ cramping/mild pain as well like she ovulated.  Lastly, she'd had worsening fatigue with this current irregular bleeding.  Cannot be on combination OCPs due to hypertension.  She doesn't really want to be on any long term hormonal therapy.  Concerns about weight gain present as well. ? ?Patient Active Problem List  ? Diagnosis Date Noted  ? Easy bruising 09/22/2021  ? Muscle cramping 09/21/2021  ? ADD (attention deficit disorder) 07/31/2021  ? Acute left-sided low back pain with left-sided sciatica 06/26/2021  ? Generalized headache 06/26/2021  ? Hyperlipidemia 01/27/2021  ? Anxiety and depression 01/27/2021  ? Family history of diabetes mellitus in mother 01/26/2021  ? Serrated adenoma of colon 04/26/2020  ? Perennial allergic rhinitis 04/10/2018  ? Chronic urticaria 04/10/2018  ? Dermatographism 04/10/2018  ? Essential hypertension, benign 02/24/2016  ? Murmur 06/26/2013  ? Palpitations 06/26/2013  ? IBS (irritable bowel syndrome)   ? Migraine   ? GERD (gastroesophageal reflux disease)   ? HSV-2 infection   ? IC (interstitial cystitis)   ? ? ?Past Medical History:  ?Diagnosis Date  ? Anemia   ? during pregnancy   ? Complication of anesthesia   ? crying excessive spitting  ? Concussion age 33 or 58 no residual  ? Family history of adverse reaction to anesthesia   ? ponv  ? GERD (gastroesophageal reflux disease)   ? HSV-2 infection   ? 2008  ? Hypertension   ? IBS (irritable bowel syndrome)   ? IC (interstitial cystitis)   ? 2008  ? Migraine   ? Wears glasses   ? ? ?Past Surgical History:  ?Procedure Laterality Date   ? ADENOIDECTOMY  as child  ? BREAST SURGERY  2002  ? right breast biopsy Benign  ? CESAREAN SECTION N/A 08/30/2013  ? Procedure: CESAREAN SECTION;  Surgeon: Marvene Staff, MD;  Location: [facility];  Service: Obstetrics;  Laterality: N/A;  ? colonscopy  04/18/2020  ? and endoscopy done  ? DILATION AND EVACUATION N/A 06/23/2020  ? Procedure: DILATATION AND EVACUATION;  Surgeon: Megan Salon, MD;  Location: [facility];  Service: Gynecology;  Laterality: N/A;  ? KNEE ARTHROSCOPY  3/11, 2012  ? right knee  ? SHOULDER ARTHROSCOPY Right 2010  ? TONSILLECTOMY  as child  ? and adenoids  ? [place] EXTRACTION  2008  ? ? ?MEDS:   ?Current Outpatient Medications on File Prior to Visit  ?Medication Sig Dispense Refill  ? cetirizine (ZYRTEC ALLERGY) 10 MG tablet Take 1 tablet (10 mg total) by mouth 2 (two) times daily as needed (hives). 60 tablet 5  ? famotidine (PEPCID) 20 MG tablet Take 20 mg by mouth as needed for heartburn or indigestion.    ? fluticasone (FLONASE) 50 MCG/ACT nasal spray Place 1 spray into both nostrils 2 (two) times daily as needed for allergies or rhinitis. 16 g 5  ? indapamide (LOZOL) 1.25 MG tablet Take 1 tablet (1.25 mg total) by mouth daily. 90 tablet 2  ? NIFEdipine (PROCARDIA XL/NIFEDICAL XL) 60 MG 24 hr  tablet Take 1 tablet (60 mg total) by mouth daily. 90 tablet 2  ? Olopatadine HCl 0.2 % SOLN Apply 1 drop to eye daily as needed (itchy/watery eyes). 2.5 mL 5  ? potassium chloride SA (KLOR-CON M) 20 MEQ tablet Take 1 tablet (20 mEq total) by mouth daily. 90 tablet 3  ? Prenatal Vit-Fe Fumarate-FA (PRENATAL MULTIVITAMIN) TABS tablet Take 1 tablet by mouth daily at 12 noon.    ? tiZANidine (ZANAFLEX) 4 MG tablet Take 4 mg by mouth at bedtime.    ? TRULANCE 3 MG TABS     ? valACYclovir (VALTREX) 1000 MG tablet Take 1 tablet (1,000 mg total) by mouth daily. 90 tablet 4  ? ?No current facility-administered medications on file prior to visit.  ? ? ?ALLERGIES: Ace inhibitors,  Adhesive [tape], and Latex ? ?Family History  ?Problem Relation Age of Onset  ? Diabetes Mother   ? Hyperlipidemia Mother   ? Anemia Mother   ? Hyperlipidemia Father   ? Hypertension Father   ? Heart disease Father   ? Stroke Sister   ? Hypertension Sister   ? Hypertension Sister   ? Thyroid disease Sister   ? Heart disease Brother   ? Anemia Maternal Aunt   ? Prostate cancer Paternal Uncle   ? Diabetes Maternal Grandmother   ? Heart failure Maternal Grandmother   ? Prostate cancer Maternal Grandfather   ? Diabetes Paternal Grandmother   ? Stroke Paternal Grandmother   ? Renal Disease Paternal Grandfather   ? Heart disease Paternal Grandfather   ? Stroke Paternal Grandfather   ? ? ?SH:  married, non smoker ? ?Review of Systems  ?Constitutional: Negative.   ?Genitourinary:   ?     Irregular bleeding  ?All other systems reviewed and are negative. ? ?PHYSICAL EXAMINATION:   ? ?BP 110/77 (BP Location: Right Arm, Patient Position: Sitting, Cuff Size: Normal)   Pulse 83   Ht 5' 3.75" (1.619 m)   Wt 169 lb 3.2 oz (76.7 kg)   BMI 29.27 kg/m?     ?.phys ? ?Assessment/Plan: ?1. Irregular bleeding ?- for now, will not start any contraception as pt does not desire being on this.  For bleeding control, will start aygestin bug until bleeding stops.  Will continue for 3 weeks and then stop.  She will likely have a cycle at that time. ?- norethindrone (AYGESTIN) 5 MG tablet; Take 1 tablet twice daily until bleeding stops for 24 hours.  Then decrease to 1 tab orally daily.  Stop taking after 21 days.  Dispense: 40 tablet; Refill: 0  ?- if bleeding is not controlled, pt will call back ? ?

## 2021-12-13 ENCOUNTER — Encounter: Payer: Self-pay | Admitting: Internal Medicine

## 2021-12-21 ENCOUNTER — Encounter (HOSPITAL_BASED_OUTPATIENT_CLINIC_OR_DEPARTMENT_OTHER): Payer: Self-pay | Admitting: *Deleted

## 2022-02-26 ENCOUNTER — Encounter: Payer: Self-pay | Admitting: Internal Medicine

## 2022-02-26 NOTE — Progress Notes (Signed)
Subjective:    Patient ID: Denise [name], female    DOB: 07-May-1976, 46 y.o.   MRN: [id number]      HPI Aireonna is here for  Chief Complaint  Patient presents with   Bruise    Bruise on right side of buttock area that hasn't healed (also has discoloration); Patient wants to talk about hot-flashes      Bruised her right buttock earlier this year - march 2023- backed into a door and her buttock region got caught up on the door handle.  It initially bruised bad, but that slowly went away.  The area still looks different -she has an area of hypopigmentation and it almost looks indented.  She denies any pain.     Low energy, fatigue-she states fatigue has been going on for a while and is not sure I.  Hot flashes - occur when she misses her periods or when she has  period - hormonal.     Difficulty getting weight under control  More difficulty concentrating, procrasting.  Gets overwhlemed at times.   Sleep is getting better - was not sleeping enough over the summer 5-6 hrs.  Now it is 6-7 hrs. Feels tired still.   Not exercising regularly.    Medications and allergies reviewed with patient and updated if appropriate.  Current Outpatient Medications on File Prior to Visit  Medication Sig Dispense Refill   cetirizine (ZYRTEC ALLERGY) 10 MG tablet Take 1 tablet (10 mg total) by mouth 2 (two) times daily as needed (hives). 60 tablet 5   famotidine (PEPCID) 20 MG tablet Take 20 mg by mouth as needed for heartburn or indigestion.     fluticasone (FLONASE) 50 MCG/ACT nasal spray Place 1 spray into both nostrils 2 (two) times daily as needed for allergies or rhinitis. 16 g 5   indapamide (LOZOL) 1.25 MG tablet Take 1 tablet (1.25 mg total) by mouth daily. 90 tablet 2   NIFEdipine (PROCARDIA XL/NIFEDICAL XL) 60 MG 24 hr tablet Take 1 tablet (60 mg total) by mouth daily. 90 tablet 2   norethindrone (AYGESTIN) 5 MG tablet Take 1 tablet twice daily until bleeding stops for 24 hours.  Then  decrease to 1 tab orally daily.  Stop taking after 21 days. 40 tablet 0   Olopatadine HCl 0.2 % SOLN Apply 1 drop to eye daily as needed (itchy/watery eyes). 2.5 mL 5   potassium chloride SA (KLOR-CON M) 20 MEQ tablet Take 1 tablet (20 mEq total) by mouth daily. 90 tablet 3   Prenatal Vit-Fe Fumarate-FA (PRENATAL MULTIVITAMIN) TABS tablet Take 1 tablet by mouth daily at 12 noon.     tiZANidine (ZANAFLEX) 4 MG tablet Take 4 mg by mouth at bedtime.     TRULANCE 3 MG TABS      valACYclovir (VALTREX) 1000 MG tablet Take 1 tablet (1,000 mg total) by mouth daily. 90 tablet 4   meloxicam (MOBIC) 15 MG tablet Take 15 mg by mouth daily as needed.     No current facility-administered medications on file prior to visit.    Review of Systems  Constitutional:  Positive for fatigue. Negative for fever.       Hot flashes  Skin:  Positive for color change.  Psychiatric/Behavioral:  Positive for decreased concentration. Negative for dysphoric mood and sleep disturbance. The patient is nervous/anxious (intemittent).        Objective:   Vitals:   02/27/22 1040  BP: 108/80  Pulse: 85  Temp: 98.3 F (  36.8 C)  SpO2: 99%   BP Readings from Last 3 Encounters:  02/27/22 108/80  10/05/21 110/77  09/22/21 112/72   Wt Readings from Last 3 Encounters:  02/27/22 172 lb (78 kg)  10/05/21 169 lb 3.2 oz (76.7 kg)  09/22/21 165 lb (74.8 kg)   Body mass index is 29.76 kg/m.    Physical Exam Constitutional:      General: She is not in acute distress.    Appearance: Normal appearance. She is not ill-appearing.  HENT:     Head: Normocephalic and atraumatic.  Skin:    General: Skin is warm and dry.     Comments: Right middle-lateral buttock irregularly-shaped area of hypopigmentation approximately the size of a grape, slight retraction-possibly secondary to scar tissue.  No areas of induration, no fluctuation, no tenderness, no erythema  Neurological:     Mental Status: She is alert.             Assessment & Plan:    See Problem List for Assessment and Plan of chronic medical problems.

## 2022-02-27 ENCOUNTER — Ambulatory Visit: Payer: Medicaid Other | Admitting: Internal Medicine

## 2022-02-27 DIAGNOSIS — R232 Flushing: Secondary | ICD-10-CM

## 2022-02-27 DIAGNOSIS — F419 Anxiety disorder, unspecified: Secondary | ICD-10-CM

## 2022-02-27 DIAGNOSIS — I10 Essential (primary) hypertension: Secondary | ICD-10-CM | POA: Diagnosis not present

## 2022-02-27 DIAGNOSIS — F988 Other specified behavioral and emotional disorders with onset usually occurring in childhood and adolescence: Secondary | ICD-10-CM | POA: Diagnosis not present

## 2022-02-27 DIAGNOSIS — F32A Depression, unspecified: Secondary | ICD-10-CM | POA: Diagnosis not present

## 2022-02-27 MED ORDER — LISDEXAMFETAMINE DIMESYLATE 30 MG PO CAPS: 30.0000 mg | ORAL_CAPSULE | Freq: Every day | ORAL | 0 refills | Status: DC

## 2022-02-27 NOTE — Assessment & Plan Note (Signed)
Subacute Likely related to being in perimenopause She does follow with her gynecologist and advised her to follow-up with her if her symptoms are getting worse or not controllable Discussed some natural things that she can try-would like to avoid prescription medication Stressed regular exercise, good sleep, avoiding foods that may trigger hot flashes and working on stress reduction

## 2022-02-27 NOTE — Patient Instructions (Addendum)
    Medications changes include :   vyvanse 30 mg daily  Your prescription(s) have been sent to your pharmacy.     Return for follow up as scheduled.    Natural things that may help with hot flashes  Acupuncture Black cohosh Evening primrose oil Soy products Phyto-estrogens-nonsteroidal components that occur naturally in plants, fruits and vegetables Exercise Avoiding certain foods

## 2022-02-27 NOTE — Assessment & Plan Note (Signed)
Chronic Currently denies any depression, but does state some intermittent anxiety She just feels overwhelmed at times This could possibly be related to some of her ADD that is not treated. Will restart Vyvanse Discussed that if she is still anxious or feeling overwhelmed we need may need to consider medication for anxiety Encouraged regular exercise, discussed stress management

## 2022-02-27 NOTE — Assessment & Plan Note (Signed)
Chronic Blood pressure well controlled Continue indapamide 1.25 mg daily, Procardia XL 60 mg daily

## 2022-02-27 NOTE — Assessment & Plan Note (Signed)
Chronic Has not been on any medication for a while and feels like she is having more difficulty concentrating and getting things done-feels overwhelmed at times Would like to retry medication Restart Vyvanse-start at 30 mg daily

## 2022-03-07 ENCOUNTER — Other Ambulatory Visit (HOSPITAL_BASED_OUTPATIENT_CLINIC_OR_DEPARTMENT_OTHER): Payer: Self-pay | Admitting: Obstetrics & Gynecology

## 2022-03-07 DIAGNOSIS — B009 Herpesviral infection, unspecified: Secondary | ICD-10-CM

## 2022-03-14 ENCOUNTER — Ambulatory Visit (INDEPENDENT_AMBULATORY_CARE_PROVIDER_SITE_OTHER): Payer: Medicaid Other | Admitting: Obstetrics & Gynecology

## 2022-03-14 ENCOUNTER — Encounter (HOSPITAL_BASED_OUTPATIENT_CLINIC_OR_DEPARTMENT_OTHER): Payer: Self-pay | Admitting: Obstetrics & Gynecology

## 2022-03-14 VITALS — BP 141/88 | HR 104 | Ht 64.0 in | Wt 168.8 lb

## 2022-03-14 DIAGNOSIS — B009 Herpesviral infection, unspecified: Secondary | ICD-10-CM | POA: Diagnosis not present

## 2022-03-14 DIAGNOSIS — Z01419 Encounter for gynecological examination (general) (routine) without abnormal findings: Secondary | ICD-10-CM

## 2022-03-14 DIAGNOSIS — R1011 Right upper quadrant pain: Secondary | ICD-10-CM | POA: Diagnosis not present

## 2022-03-14 DIAGNOSIS — K581 Irritable bowel syndrome with constipation: Secondary | ICD-10-CM

## 2022-03-14 DIAGNOSIS — N926 Irregular menstruation, unspecified: Secondary | ICD-10-CM

## 2022-03-14 LAB — POCT URINALYSIS DIPSTICK
Appearance: NORMAL
Bilirubin, UA: NEGATIVE
Glucose, UA: NEGATIVE
Ketones, UA: NEGATIVE
Leukocytes, UA: NEGATIVE
Nitrite, UA: NEGATIVE
Protein, UA: NEGATIVE
Spec Grav, UA: 1.01 (ref 1.010–1.025)
Urobilinogen, UA: 0.2 E.U./dL
pH, UA: 7 (ref 5.0–8.0)

## 2022-03-14 NOTE — Progress Notes (Signed)
46 y.o. G60P1021 Married Denise [name] here for annual exam.  Reports she's been having some upper right quadrant pain.  She states she often has a similar type pain on the left upper quadrant pain.  Typically the left side feels more like constipation.  This pain on the right, yesterday, was so significant that she had to sit down.  Had associated nausea.  Took a zanaflex last night and didn't help other than it made her sleepy.  Isn't feeling like herself.  Separately, she is having a lot of vaginal dryness and has noted some spotting with intercourse.  Has been having some hot flashes as well.  Does have painful intercourse.    H/o HSV.  On Valtrex and takes daily or at least tries.        Sexually active: Yes.    The current method of family planning is none.    Smoker:  no  Health Maintenance: Pap:  03/01/2021 Negative History of abnormal Pap:  no MMG:  12/20/2021 Negative Colonoscopy:  04/19/2020 Screening Labs: CBC and CMP ordered for today   reports that she has never smoked. She has never used smokeless tobacco. She reports that she does not currently use alcohol. She reports current drug use. Drug: Marijuana.  Past Medical History:  Diagnosis Date   Anemia    during pregnancy    Complication of anesthesia    crying excessive spitting   Concussion age 39 or 24 no residual   Family history of adverse reaction to anesthesia    ponv   GERD (gastroesophageal reflux disease)    HSV-2 infection    2008   Hypertension    IBS (irritable bowel syndrome)    IC (interstitial cystitis)    2008   Migraine    Wears glasses     Past Surgical History:  Procedure Laterality Date   ADENOIDECTOMY  as child   BREAST SURGERY  2002   right breast biopsy Benign   CESAREAN SECTION N/A 08/30/2013   Procedure: CESAREAN SECTION;  Surgeon: Marvene Staff, MD;  Location: [facility];  Service: Obstetrics;  Laterality: N/A;   colonscopy  04/18/2020   and endoscopy done    DILATION AND EVACUATION N/A 06/23/2020   Procedure: DILATATION AND EVACUATION;  Surgeon: Megan Salon, MD;  Location: [facility];  Service: Gynecology;  Laterality: N/A;   KNEE ARTHROSCOPY  3/11, 2012   right knee   SHOULDER ARTHROSCOPY Right 2010   TONSILLECTOMY  as child   and adenoids   WISDOM TOOTH EXTRACTION  2008    Current Outpatient Medications  Medication Sig Dispense Refill   cetirizine (ZYRTEC ALLERGY) 10 MG tablet Take 1 tablet (10 mg total) by mouth 2 (two) times daily as needed (hives). 60 tablet 5   famotidine (PEPCID) 20 MG tablet Take 20 mg by mouth as needed for heartburn or indigestion.     fluticasone (FLONASE) 50 MCG/ACT nasal spray Place 1 spray into both nostrils 2 (two) times daily as needed for allergies or rhinitis. 16 g 5   indapamide (LOZOL) 1.25 MG tablet Take 1 tablet (1.25 mg total) by mouth daily. 90 tablet 2   lisdexamfetamine (VYVANSE) 30 MG capsule Take 1 capsule (30 mg total) by mouth daily. 30 capsule 0   meloxicam (MOBIC) 15 MG tablet Take 15 mg by mouth daily as needed.     NIFEdipine (PROCARDIA XL/NIFEDICAL XL) 60 MG 24 hr tablet Take 1 tablet (60 mg total) by  mouth daily. 90 tablet 2   Olopatadine HCl 0.2 % SOLN Apply 1 drop to eye daily as needed (itchy/watery eyes). 2.5 mL 5   potassium chloride SA (KLOR-CON M) 20 MEQ tablet Take 1 tablet (20 mEq total) by mouth daily. 90 tablet 3   Prenatal Vit-Fe Fumarate-FA (PRENATAL MULTIVITAMIN) TABS tablet Take 1 tablet by mouth daily at 12 noon.     tiZANidine (ZANAFLEX) 4 MG tablet Take 4 mg by mouth at bedtime.     TRULANCE 3 MG TABS      valACYclovir (VALTREX) 1000 MG tablet TAKE ONE TABLET BY MOUTH DAILY 90 tablet 4   No current facility-administered medications for this visit.    Family History  Problem Relation Age of Onset   Diabetes Mother    Hyperlipidemia Mother    Anemia Mother    Hyperlipidemia Father    Hypertension Father    Heart disease Father    Stroke Sister     Hypertension Sister    Hypertension Sister    Thyroid disease Sister    Heart disease Brother    Anemia Maternal Aunt    Prostate cancer Paternal Uncle    Diabetes Maternal Grandmother    Heart failure Maternal Grandmother    Prostate cancer Maternal Grandfather    Diabetes Paternal Grandmother    Stroke Paternal Grandmother    Renal Disease Paternal Grandfather    Heart disease Paternal Grandfather    Stroke Paternal Grandfather     ROS: Constitutional: negative Genitourinary:negative  Exam:   BP (!) 141/88 (BP Location: Right Arm, Patient Position: Sitting, Cuff Size: Large)   Pulse (!) 104   Ht '5\' 4"'$  (1.626 m) Comment: Reported  Wt 168 lb 12.8 oz (76.6 kg)   LMP 01/08/2022   BMI 28.97 kg/m   Height: '5\' 4"'$  (162.6 cm) (Reported)  General appearance: alert, cooperative and appears stated age Head: Normocephalic, without obvious abnormality, atraumatic Neck: no adenopathy, supple, symmetrical, trachea midline and thyroid normal to inspection and palpation Lungs: clear to auscultation bilaterally Breasts: normal appearance, no masses or tenderness Heart: regular rate and rhythm Abdomen: soft, non-tender; bowel sounds normal; no masses,  no organomegaly Extremities: extremities normal, atraumatic, no cyanosis or edema Skin: Skin color, texture, turgor normal. No rashes or lesions Lymph nodes: Cervical, supraclavicular, and axillary nodes normal. No abnormal inguinal nodes palpated Neurologic: Grossly normal   Pelvic: External genitalia:  no lesions              Urethra:  normal appearing urethra with no masses, tenderness or lesions              Bartholins and Skenes: normal                 Vagina: normal appearing vagina with normal color and no discharge, no lesions              Cervix: no lesions              Pap taken: No. Bimanual Exam:  Uterus:  normal size, contour, position, consistency, mobility, non-tender              Adnexa: normal adnexa and no mass,  fullness, tenderness               Rectovaginal: Confirms               Anus:  normal sphincter tone, no lesions  Chaperone, Octaviano Batty, CMA, was present for exam.  Assessment/Plan: 1. Well woman  exam with routine gynecological exam - Pap smear neg with neg HR HPV 2022 - Mammogram 11/2021 - Colonoscopy 2021.  Referral to GI placed - vaccines reviewed/updated  2. RUQ pain - CBC with Differential/Platelet - Comprehensive metabolic panel - CT ABDOMEN W CONTRAST; Future  3. HSV-2 infection - does not need valtrex RX  4. Irritable bowel syndrome with constipation - Ambulatory referral to Gastroenterology  5. Irregular menstrual cycle -pt will let me know if doesn't have cycle by 11/14.  Would do provera challenge then

## 2022-03-14 NOTE — Addendum Note (Signed)
Addended by: Blenda Nicely on: 03/14/2022 05:02 PM   Modules accepted: Orders

## 2022-03-15 LAB — COMPREHENSIVE METABOLIC PANEL
ALT: 17 IU/L (ref 0–32)
AST: 24 IU/L (ref 0–40)
Albumin/Globulin Ratio: 2.1 (ref 1.2–2.2)
Albumin: 4.9 g/dL (ref 3.9–4.9)
Alkaline Phosphatase: 87 IU/L (ref 44–121)
BUN/Creatinine Ratio: 12 (ref 9–23)
BUN: 14 mg/dL (ref 6–24)
Bilirubin Total: 1.4 mg/dL — ABNORMAL HIGH (ref 0.0–1.2)
CO2: 27 mmol/L (ref 20–29)
Calcium: 10.3 mg/dL — ABNORMAL HIGH (ref 8.7–10.2)
Chloride: 99 mmol/L (ref 96–106)
Creatinine, Ser: 1.15 mg/dL — ABNORMAL HIGH (ref 0.57–1.00)
Globulin, Total: 2.3 g/dL (ref 1.5–4.5)
Glucose: 106 mg/dL — ABNORMAL HIGH (ref 70–99)
Potassium: 3.4 mmol/L — ABNORMAL LOW (ref 3.5–5.2)
Sodium: 141 mmol/L (ref 134–144)
Total Protein: 7.2 g/dL (ref 6.0–8.5)
eGFR: 60 mL/min/{1.73_m2} (ref >59)

## 2022-03-15 LAB — CBC WITH DIFFERENTIAL/PLATELET
Basophils Absolute: 0 10*3/uL (ref 0.0–0.2)
Basos: 1 %
EOS (ABSOLUTE): 0 10*3/uL (ref 0.0–0.4)
Eos: 0 %
Hematocrit: 43.5 % (ref 34.0–46.6)
Hemoglobin: 14.7 g/dL (ref 11.1–15.9)
Immature Grans (Abs): 0 10*3/uL (ref 0.0–0.1)
Immature Granulocytes: 0 %
Lymphocytes Absolute: 1.5 10*3/uL (ref 0.7–3.1)
Lymphs: 20 %
MCH: 29.2 pg (ref 26.6–33.0)
MCHC: 33.8 g/dL (ref 31.5–35.7)
MCV: 87 fL (ref 79–97)
Monocytes Absolute: 0.7 10*3/uL (ref 0.1–0.9)
Monocytes: 9 %
Neutrophils Absolute: 5.5 10*3/uL (ref 1.4–7.0)
Neutrophils: 70 %
Platelets: 291 10*3/uL (ref 150–450)
RBC: 5.03 x10E6/uL (ref 3.77–5.28)
RDW: 13 % (ref 11.7–15.4)
WBC: 7.8 10*3/uL (ref 3.4–10.8)

## 2022-03-16 ENCOUNTER — Other Ambulatory Visit: Payer: Self-pay

## 2022-03-16 ENCOUNTER — Encounter (HOSPITAL_BASED_OUTPATIENT_CLINIC_OR_DEPARTMENT_OTHER): Payer: Self-pay | Admitting: Emergency Medicine

## 2022-03-16 ENCOUNTER — Emergency Department (HOSPITAL_BASED_OUTPATIENT_CLINIC_OR_DEPARTMENT_OTHER)
Admission: EM | Admit: 2022-03-16 | Discharge: 2022-03-16 | Disposition: A | Discharge status: Home / Self Care | Payer: Medicaid Other | Attending: Emergency Medicine | Admitting: Emergency Medicine

## 2022-03-16 ENCOUNTER — Emergency Department (HOSPITAL_BASED_OUTPATIENT_CLINIC_OR_DEPARTMENT_OTHER): Payer: Medicaid Other

## 2022-03-16 DIAGNOSIS — N12 Tubulo-interstitial nephritis, not specified as acute or chronic: Secondary | ICD-10-CM

## 2022-03-16 DIAGNOSIS — R1031 Right lower quadrant pain: Secondary | ICD-10-CM | POA: Diagnosis present

## 2022-03-16 DIAGNOSIS — N200 Calculus of kidney: Secondary | ICD-10-CM | POA: Diagnosis not present

## 2022-03-16 DIAGNOSIS — I719 Aortic aneurysm of unspecified site, without rupture: Secondary | ICD-10-CM | POA: Diagnosis not present

## 2022-03-16 LAB — COMPREHENSIVE METABOLIC PANEL
ALT: 10 U/L (ref 0–44)
AST: 17 U/L (ref 15–41)
Albumin: 4.7 g/dL (ref 3.5–5.0)
Alkaline Phosphatase: 71 U/L (ref 38–126)
Anion gap: 13 (ref 5–15)
BUN: 16 mg/dL (ref 6–20)
CO2: 26 mmol/L (ref 22–32)
Calcium: 10.1 mg/dL (ref 8.9–10.3)
Chloride: 100 mmol/L (ref 98–111)
Creatinine, Ser: 1.07 mg/dL — ABNORMAL HIGH (ref 0.44–1.00)
GFR, Estimated: >60 mL/min (ref >60)
Glucose, Bld: 94 mg/dL (ref 70–99)
Potassium: 3.4 mmol/L — ABNORMAL LOW (ref 3.5–5.1)
Sodium: 139 mmol/L (ref 135–145)
Total Bilirubin: 1.1 mg/dL (ref 0.3–1.2)
Total Protein: 7.5 g/dL (ref 6.5–8.1)

## 2022-03-16 LAB — URINALYSIS, ROUTINE W REFLEX MICROSCOPIC
Bilirubin Urine: NEGATIVE
Glucose, UA: NEGATIVE mg/dL
Hgb urine dipstick: NEGATIVE
Ketones, ur: NEGATIVE mg/dL
Leukocytes,Ua: NEGATIVE
Nitrite: NEGATIVE
Specific Gravity, Urine: 1.009 (ref 1.005–1.030)
pH: 7 (ref 5.0–8.0)

## 2022-03-16 LAB — CBC
HCT: 39.2 % (ref 36.0–46.0)
Hemoglobin: 14.4 g/dL (ref 12.0–15.0)
MCH: 29.5 pg (ref 26.0–34.0)
MCHC: 36.7 g/dL — ABNORMAL HIGH (ref 30.0–36.0)
MCV: 80.3 fL (ref 80.0–100.0)
Platelets: 297 10*3/uL (ref 150–400)
RBC: 4.88 MIL/uL (ref 3.87–5.11)
RDW: 12.5 % (ref 11.5–15.5)
WBC: 7.1 10*3/uL (ref 4.0–10.5)
nRBC: 0 % (ref 0.0–0.2)

## 2022-03-16 LAB — PREGNANCY, URINE: Preg Test, Ur: NEGATIVE

## 2022-03-16 LAB — LIPASE, BLOOD: Lipase: <10 U/L — ABNORMAL LOW (ref 11–51)

## 2022-03-16 MED ORDER — MORPHINE SULFATE (PF) 4 MG/ML IV SOLN
4.0000 mg | Freq: Once | INTRAVENOUS | Status: AC
  Administered 2022-03-16: 4 mg via INTRAVENOUS
  Filled 2022-03-16: qty 1

## 2022-03-16 MED ORDER — HYDROCODONE-ACETAMINOPHEN 5-325 MG PO TABS: 1.0000 | ORAL_TABLET | ORAL | 0 refills | Status: DC | PRN

## 2022-03-16 MED ORDER — IOHEXOL 350 MG/ML SOLN
100.0000 mL | Freq: Once | INTRAVENOUS | Status: AC | PRN
  Administered 2022-03-16: 80 mL via INTRAVENOUS

## 2022-03-16 MED ORDER — CIPROFLOXACIN HCL 500 MG PO TABS
500.0000 mg | ORAL_TABLET | Freq: Once | ORAL | Status: AC
  Administered 2022-03-16: 500 mg via ORAL
  Filled 2022-03-16: qty 1

## 2022-03-16 MED ORDER — ONDANSETRON HCL 4 MG PO TABS: 4.0000 mg | ORAL_TABLET | Freq: Three times a day (TID) | ORAL | 0 refills | Status: DC | PRN

## 2022-03-16 MED ORDER — CIPROFLOXACIN HCL 500 MG PO TABS: 500.0000 mg | ORAL_TABLET | Freq: Two times a day (BID) | ORAL | 0 refills | Status: DC

## 2022-03-16 MED ORDER — ONDANSETRON HCL 4 MG/2ML IJ SOLN
4.0000 mg | Freq: Once | INTRAMUSCULAR | Status: AC
  Administered 2022-03-16: 4 mg via INTRAVENOUS
  Filled 2022-03-16: qty 2

## 2022-03-16 NOTE — ED Provider Notes (Signed)
[facility] EMERGENCY DEPT Provider Note   CSN: [id number] Arrival date & time: 03/16/22  1710     History  Chief Complaint  Patient presents with   Abdominal Pain    Denise [name] is a 46 y.o. female.  Patient presents to the hospital complaining of right-sided flank and abdominal pain which been ongoing since Tuesday.  Patient also complains of a pulsatile "mass" that she can feel in the center of her abdomen just below the sternum.  She states that she was seen at her OB/GYN provider's office earlier this week and they ordered an outpatient CT for later next week.  She was found to have right CVA tenderness at that time and found to have hematuria.  Patient also endorses nausea and vomiting.  She denies shortness of breath, dysuria, vaginal bleeding or discharge.  Past medical history includes IBS, GERD, interstitial cystitis, essential hypertension, anxiety depression, hyperlipidemia  HPI     Home Medications Prior to Admission medications   Medication Sig Start Date End Date Taking? Authorizing Provider  ciprofloxacin (CIPRO) 500 MG tablet Take 1 tablet (500 mg total) by mouth 2 (two) times daily. 03/16/22  Yes Dorothyann Peng, PA-C  cetirizine (ZYRTEC ALLERGY) 10 MG tablet Take 1 tablet (10 mg total) by mouth 2 (two) times daily as needed (hives). 08/08/20   Garnet Sierras, DO  famotidine (PEPCID) 20 MG tablet Take 20 mg by mouth as needed for heartburn or indigestion.    [provider]  fluticasone (FLONASE) 50 MCG/ACT nasal spray Place 1 spray into both nostrils 2 (two) times daily as needed for allergies or rhinitis. 08/09/21   Garnet Sierras, DO  indapamide (LOZOL) 1.25 MG tablet Take 1 tablet (1.25 mg total) by mouth daily. 07/31/21   Binnie Rail, MD  lisdexamfetamine (VYVANSE) 30 MG capsule Take 1 capsule (30 mg total) by mouth daily. 02/27/22   Binnie Rail, MD  meloxicam (MOBIC) 15 MG tablet Take 15 mg by mouth daily as needed. 01/07/22   [provider]  NIFEdipine (PROCARDIA XL/NIFEDICAL XL) 60 MG 24 hr tablet Take 1 tablet (60 mg total) by mouth daily. 07/31/21   Binnie Rail, MD  Olopatadine HCl 0.2 % SOLN Apply 1 drop to eye daily as needed (itchy/watery eyes). 08/09/21   Garnet Sierras, DO  potassium chloride SA (KLOR-CON M) 20 MEQ tablet Take 1 tablet (20 mEq total) by mouth daily. 09/22/21   Binnie Rail, MD  Prenatal Vit-Fe Fumarate-FA (PRENATAL MULTIVITAMIN) TABS tablet Take 1 tablet by mouth daily at 12 noon.    [provider]  tiZANidine (ZANAFLEX) 4 MG tablet Take 4 mg by mouth at bedtime. 07/05/21   [provider]  TRULANCE 3 MG TABS  04/29/20   [provider]  valACYclovir (VALTREX) 1000 MG tablet TAKE ONE TABLET BY MOUTH DAILY 03/07/22   Megan Salon, MD      Allergies    Ace inhibitors, Adhesive [tape], and Latex    Review of Systems   Review of Systems  Gastrointestinal:  Positive for abdominal pain, nausea and vomiting.  Genitourinary:  Positive for hematuria. Negative for dysuria.    Physical Exam Updated Vital Signs BP (!) 139/94 (BP Location: Right Arm)   Pulse 86   Temp 98.3 F (36.8 C) (Oral)   Resp 18   LMP  (LMP Unknown)   SpO2 93%  Physical Exam Vitals and nursing note reviewed.  Constitutional:  General: She is not in acute distress.    Appearance: She is well-developed.  HENT:     Head: Normocephalic and atraumatic.  Eyes:     Conjunctiva/sclera: Conjunctivae normal.  Cardiovascular:     Rate and Rhythm: Normal rate and regular rhythm.     Heart sounds: No murmur heard. Pulmonary:     Effort: Pulmonary effort is normal. No respiratory distress.     Breath sounds: Normal breath sounds.  Abdominal:     General: Abdomen is flat.     Palpations: Abdomen is soft.     Tenderness: There is no abdominal tenderness. There is right CVA tenderness.  Musculoskeletal:        General: No swelling.     Cervical back: Neck supple.  Skin:    General: Skin is  warm and dry.     Capillary Refill: Capillary refill takes less than 2 seconds.  Neurological:     Mental Status: She is alert.  Psychiatric:        Mood and Affect: Mood normal.     ED Results / Procedures / Treatments   Labs (all labs ordered are listed, but only abnormal results are displayed) Labs Reviewed  LIPASE, BLOOD - Abnormal; Notable for the following components:      Result Value   Lipase <10 (*)    All other components within normal limits  COMPREHENSIVE METABOLIC PANEL - Abnormal; Notable for the following components:   Potassium 3.4 (*)    Creatinine, Ser 1.07 (*)    All other components within normal limits  CBC - Abnormal; Notable for the following components:   MCHC 36.7 (*)    All other components within normal limits  URINALYSIS, ROUTINE W REFLEX MICROSCOPIC - Abnormal; Notable for the following components:   Protein, ur TRACE (*)    All other components within normal limits  PREGNANCY, URINE    EKG None  Radiology CT Angio Chest/Abd/Pel for Dissection W and/or Wo Contrast  Result Date: 03/16/2022 CLINICAL DATA:  Aortic aneurysm. EXAM: CT ANGIOGRAPHY CHEST, ABDOMEN AND PELVIS TECHNIQUE: Non-contrast CT of the chest was initially obtained. Multidetector CT imaging through the chest, abdomen and pelvis was performed using the standard protocol during bolus administration of intravenous contrast. Multiplanar reconstructed images and MIPs were obtained and reviewed to evaluate the vascular anatomy. RADIATION DOSE REDUCTION: This exam was performed according to the departmental dose-optimization program which includes automated exposure control, adjustment of the mA and/or kV according to patient size and/or use of iterative reconstruction technique. CONTRAST:  2m OMNIPAQUE IOHEXOL 350 MG/ML SOLN COMPARISON:  CT abdomen pelvis dated 12/04/2014. FINDINGS: CTA CHEST FINDINGS Cardiovascular: There is no cardiomegaly or pericardial effusion. The thoracic aorta is  unremarkable. The origins of the great vessels of the aortic arch appear patent. No pulmonary artery embolus identified. Mediastinum/Nodes: No hilar or mediastinal adenopathy. The esophagus and the thyroid gland are grossly unremarkable. No mediastinal fluid collection. Lungs/Pleura: The lungs are clear. There is no pleural effusion or pneumothorax. The central airways are patent. Musculoskeletal: No chest wall abnormality. No acute or significant osseous findings. Review of the MIP images confirms the above findings. CTA ABDOMEN AND PELVIS FINDINGS VASCULAR Aorta: Normal caliber aorta without aneurysm, dissection, vasculitis or significant stenosis. Celiac: Patent without evidence of aneurysm, dissection, vasculitis or significant stenosis. SMA: Patent without evidence of aneurysm, dissection, vasculitis or significant stenosis. Renals: Both renal arteries are patent without evidence of aneurysm, dissection, vasculitis, fibromuscular dysplasia or significant stenosis. IMA: Patent without evidence  of aneurysm, dissection, vasculitis or significant stenosis. Inflow: Patent without evidence of aneurysm, dissection, vasculitis or significant stenosis. Veins: No obvious venous abnormality within the limitations of this arterial phase study. Review of the MIP images confirms the above findings. NON-VASCULAR No intra-abdominal free air or free fluid. Hepatobiliary: No focal liver abnormality is seen. No gallstones, gallbladder wall thickening, or biliary dilatation. Pancreas: Unremarkable. No pancreatic ductal dilatation or surrounding inflammatory changes. Spleen: Normal in size without focal abnormality. Adrenals/Urinary Tract: The adrenal glands are unremarkable. There is heterogeneous enhancement of the right renal parenchyma suspicious for pyelonephritis. Correlation with urinalysis recommended. No drainable fluid collection or abscess. The visualized ureters and urinary bladder appear unremarkable. Stomach/Bowel:  There is no bowel obstruction or active inflammation. The appendix is normal. Lymphatic: No adenopathy. Reproductive: The uterus is anteverted and grossly unremarkable. No adnexal masses. Other: None Musculoskeletal: No acute or significant osseous findings. Review of the MIP images confirms the above findings. IMPRESSION: 1. No acute intrathoracic pathology. No aortic aneurysm or dissection. 2. Findings suspicious for right pyelonephritis. Correlation with urinalysis recommended. No drainable fluid collection or abscess. Electronically Signed   By: Anner Crete M.D.   On: 03/16/2022 21:06    Procedures Procedures    Medications Ordered in ED Medications  ciprofloxacin (CIPRO) tablet 500 mg (has no administration in time range)  morphine (PF) 4 MG/ML injection 4 mg (4 mg Intravenous Given 03/16/22 2028)  ondansetron (ZOFRAN) injection 4 mg (4 mg Intravenous Given 03/16/22 2028)  iohexol (OMNIPAQUE) 350 MG/ML injection 100 mL (80 mLs Intravenous Contrast Given 03/16/22 2048)    ED Course/ Medical Decision Making/ A&P                           Medical Decision Making Amount and/or Complexity of Data Reviewed Labs: ordered. Radiology: ordered.  Risk Prescription drug management.   This patient presents to the ED for concern of right-sided flank pain, hematuria, and a pulsatile feeling, this involves an extensive number of treatment options, and is a complaint that carries with it a high risk of complications and morbidity.  The differential diagnosis includes aortic dissection, nephrolithiasis, pyelonephritis, appendicitis, cholecystitis, and others   Co morbidities that complicate the patient evaluation  History IBS   Additional history obtained:  External records from outside source obtained and reviewed including office visits from OB/GYN clinic   Lab Tests:  I Ordered, and personally interpreted labs.  The pertinent results include: Potassium 3.4, trace protein on UA,  lipase less than 10, negative urine pregnancy, unremarkable CBC   Imaging Studies ordered:  I ordered imaging studies including CT dissection study I independently visualized and interpreted imaging which showed  1. No acute intrathoracic pathology. No aortic aneurysm or  dissection.  2. Findings suspicious for right pyelonephritis. Correlation with  urinalysis recommended. No drainable fluid collection or abscess.   I agree with the radiologist interpretation   Cardiac Monitoring: / EKG:  The patient was maintained on a cardiac monitor.  I personally viewed and interpreted the cardiac monitored which showed an underlying rhythm of: Normal sinus rhythm   Problem List / ED Course / Critical interventions / Medication management   I ordered medication including morphine for pain, Zofran for nausea, Cipro for pyelonephritis Reevaluation of the patient after these medicines showed that the patient improved I have reviewed the patients home medicines and have made adjustments as needed    Test / Admission - Considered:  CT shows no  signs of dissection.  No signs of aortic aneurysm.  No nephrolithiasis noted on imaging.  CT showed signs of possible pyelonephritis which is consistent with patient's story of right-sided flank pain and hematuria.  Plan to discharge patient home with course of Cipro.  Recommend she follow-up with her primary care team for further evaluation and management as needed.  Patient given return precautions.  Patient will follow-up with her OB/GYN to see if she still needs to complete the scheduled CT scan for next week.  Discharge home        Final Clinical Impression(s) / ED Diagnoses Final diagnoses:  Pyelonephritis    Rx / DC Orders ED Discharge Orders          Ordered    ciprofloxacin (CIPRO) 500 MG tablet  2 times daily        03/16/22 2127              Denise [name] 03/16/22 2127    Elnora Morrison, MD 03/17/22 [phone]

## 2022-03-16 NOTE — ED Triage Notes (Signed)
Left sided Abdominal pain started a few weeks ago. Tuesday started with pain on right abdo, n/v/  Reports feeling hr in upper abdominal area . Appears mildly uncomfortable in triage

## 2022-03-16 NOTE — Discharge Instructions (Addendum)
You were seen today for right-sided flank pain.  Your CT scan shows results concerning for possible pyelonephritis or infection of the kidney.  There were no signs of aortic aneurysm or kidney stone.  Please take the prescribed antibiotics.  You may follow-up with your OB/GYN provider to see if she still wants you to complete the CT scan scheduled for next week.  I have also prescribed a short course of pain medication and nausea medication to be used as needed.  If your pain increases to the point that you are unable to bear it at home you may return to the emergency department for further evaluation.

## 2022-03-16 NOTE — ED Notes (Signed)
Patient's sister is here to pick her up d/t being given pain medication.

## 2022-03-19 ENCOUNTER — Encounter (HOSPITAL_BASED_OUTPATIENT_CLINIC_OR_DEPARTMENT_OTHER): Payer: Self-pay | Admitting: Obstetrics & Gynecology

## 2022-03-19 ENCOUNTER — Encounter: Payer: Self-pay | Admitting: *Deleted

## 2022-03-22 ENCOUNTER — Other Ambulatory Visit: Payer: Medicaid Other

## 2022-03-25 ENCOUNTER — Encounter: Payer: Self-pay | Admitting: Internal Medicine

## 2022-03-25 NOTE — Progress Notes (Unsigned)
Subjective:    Patient ID: Denise [name], female    DOB: [birth year], 46 y.o.   MRN: [id number]     HPI Sheritha is here for follow up from the ED.  10/20 - went to ED for abdominal pain, right sided flank pain x 3 days.  She had N/V.  She had trace protein in her urine and R CVA tenderness on exam.  CTA Abd/pelvis - possible right pyelonephritis. No abscess.  No aortic aneurysm or dissection.   In ED Cipro 500 mg po, morphine 4 mg IV< zofran 4 mg IV.  Prescribed cipro which she has completed.      Medications and allergies reviewed with patient and updated if appropriate.  Current Outpatient Medications on File Prior to Visit  Medication Sig Dispense Refill   cetirizine (ZYRTEC ALLERGY) 10 MG tablet Take 1 tablet (10 mg total) by mouth 2 (two) times daily as needed (hives). 60 tablet 5   ciprofloxacin (CIPRO) 500 MG tablet Take 1 tablet (500 mg total) by mouth 2 (two) times daily. 14 tablet 0   famotidine (PEPCID) 20 MG tablet Take 20 mg by mouth as needed for heartburn or indigestion.     fluticasone (FLONASE) 50 MCG/ACT nasal spray Place 1 spray into both nostrils 2 (two) times daily as needed for allergies or rhinitis. 16 g 5   HYDROcodone-acetaminophen (NORCO/VICODIN) 5-325 MG tablet Take 1 tablet by mouth every 4 (four) hours as needed. 15 tablet 0   indapamide (LOZOL) 1.25 MG tablet Take 1 tablet (1.25 mg total) by mouth daily. 90 tablet 2   lisdexamfetamine (VYVANSE) 30 MG capsule Take 1 capsule (30 mg total) by mouth daily. 30 capsule 0   meloxicam (MOBIC) 15 MG tablet Take 15 mg by mouth daily as needed.     NIFEdipine (PROCARDIA XL/NIFEDICAL XL) 60 MG 24 hr tablet Take 1 tablet (60 mg total) by mouth daily. 90 tablet 2   Olopatadine HCl 0.2 % SOLN Apply 1 drop to eye daily as needed (itchy/watery eyes). 2.5 mL 5   ondansetron (ZOFRAN) 4 MG tablet Take 1 tablet (4 mg total) by mouth every 8 (eight) hours as needed for nausea or vomiting. 20 tablet 0   potassium chloride SA  (KLOR-CON M) 20 MEQ tablet Take 1 tablet (20 mEq total) by mouth daily. 90 tablet 3   Prenatal Vit-Fe Fumarate-FA (PRENATAL MULTIVITAMIN) TABS tablet Take 1 tablet by mouth daily at 12 noon.     tiZANidine (ZANAFLEX) 4 MG tablet Take 4 mg by mouth at bedtime.     TRULANCE 3 MG TABS      valACYclovir (VALTREX) 1000 MG tablet TAKE ONE TABLET BY MOUTH DAILY 90 tablet 4   No current facility-administered medications on file prior to visit.     Review of Systems     Objective:  There were no vitals filed for this visit. BP Readings from Last 3 Encounters:  03/16/22 (!) 135/92  03/14/22 (!) 141/88  02/27/22 108/80   Wt Readings from Last 3 Encounters:  03/14/22 168 lb 12.8 oz (76.6 kg)  02/27/22 172 lb (78 kg)  10/05/21 169 lb 3.2 oz (76.7 kg)   There is no height or weight on file to calculate BMI.    Physical Exam     Lab Results  Component Value Date   WBC 7.1 03/16/2022   HGB 14.4 03/16/2022   HCT 39.2 03/16/2022   PLT 297 03/16/2022   GLUCOSE 94 03/16/2022   CHOL  257 (H) 07/31/2021   TRIG 56.0 07/31/2021   HDL 86.10 07/31/2021   LDLCALC 160 (H) 07/31/2021   ALT 10 03/16/2022   AST 17 03/16/2022   NA 139 03/16/2022   K 3.4 (L) 03/16/2022   CL 100 03/16/2022   CREATININE 1.07 (H) 03/16/2022   BUN 16 03/16/2022   CO2 26 03/16/2022   TSH 1.22 07/31/2021   INR 1.0 07/26/2020   HGBA1C 5.4 01/27/2021     Assessment & Plan:    See Problem List for Assessment and Plan of chronic medical problems.

## 2022-03-26 ENCOUNTER — Ambulatory Visit: Payer: Medicaid Other | Admitting: Internal Medicine

## 2022-03-26 DIAGNOSIS — M546 Pain in thoracic spine: Secondary | ICD-10-CM

## 2022-03-26 DIAGNOSIS — I10 Essential (primary) hypertension: Secondary | ICD-10-CM | POA: Diagnosis not present

## 2022-03-26 DIAGNOSIS — M549 Dorsalgia, unspecified: Secondary | ICD-10-CM | POA: Insufficient documentation

## 2022-03-26 DIAGNOSIS — N12 Tubulo-interstitial nephritis, not specified as acute or chronic: Secondary | ICD-10-CM | POA: Diagnosis not present

## 2022-03-26 NOTE — Assessment & Plan Note (Signed)
EGD 10/20-work-up revealed probable right-sided pyelonephritis Resolved since pain medication, antinausea medication and antibiotics Not all of her symptoms completely fit this pattern, but did have some symptoms that were suggestive of pyelonephritis Symptoms have for the most part resolved although still has mild abdominal discomfort and twinges in her back at times Symptomatic treatment only Continue increased fluids No concerning symptoms to suggest rechecking her urine

## 2022-03-26 NOTE — Assessment & Plan Note (Signed)
Prior to going the emergency room she was experiencing significant back pain that sounded more like a kidney stone CT suggestive of pyelonephritis Symptoms have resolved except for occasional twinges of pain in her back Monitor only at this time Symptomatic treatment Call or return if symptoms recur

## 2022-03-26 NOTE — Patient Instructions (Signed)
    Medications changes include :   none    

## 2022-03-26 NOTE — Assessment & Plan Note (Signed)
Chronic Blood pressure well controlled-it was elevated in the ED Continue nifedipine XL 60 mg daily

## 2022-04-05 ENCOUNTER — Encounter: Payer: Self-pay | Admitting: Internal Medicine

## 2022-04-05 ENCOUNTER — Ambulatory Visit: Payer: Medicaid Other | Admitting: Internal Medicine

## 2022-04-05 VITALS — BP 118/68 | HR 85 | Temp 98.4°F | Ht 64.0 in | Wt 167.0 lb

## 2022-04-05 DIAGNOSIS — J069 Acute upper respiratory infection, unspecified: Secondary | ICD-10-CM | POA: Diagnosis not present

## 2022-04-05 MED ORDER — HYDROCOD POLI-CHLORPHE POLI ER 10-8 MG/5ML PO SUER: 5.0000 mL | Freq: Two times a day (BID) | ORAL | 0 refills | Status: DC | PRN

## 2022-04-05 NOTE — Assessment & Plan Note (Signed)
Acute Symptoms likely viral in nature Covid negative at home Tussionex cough syrup Q12 hr prn Continue symptomatic treatment with over-the-counter cold medications, Tylenol/ibuprofen Increase rest and fluids Call if symptoms worsen or do not improve

## 2022-04-05 NOTE — Progress Notes (Signed)
Subjective:    Patient ID: Denise [name], female    DOB: February 28, 1976, 46 y.o.   MRN: [id number]      HPI Shanaya is here for  Chief Complaint  Patient presents with   Cough    Sore throat , running nose, headache and excessive mucus for about a week.    She is here for an acute visit for cold symptoms.   Her symptoms started 6 days ago  She is experiencing fatigue, nasal congestion, ear discomfort/clogged, sinus pressure, ST, cough that is productive, headaches, some aches and lightheadedness  She has tried taking coricidin     Medications and allergies reviewed with patient and updated if appropriate.  Current Outpatient Medications on File Prior to Visit  Medication Sig Dispense Refill   cetirizine (ZYRTEC ALLERGY) 10 MG tablet Take 1 tablet (10 mg total) by mouth 2 (two) times daily as needed (hives). 60 tablet 5   famotidine (PEPCID) 20 MG tablet Take 20 mg by mouth as needed for heartburn or indigestion.     fluticasone (FLONASE) 50 MCG/ACT nasal spray Place 1 spray into both nostrils 2 (two) times daily as needed for allergies or rhinitis. 16 g 5   indapamide (LOZOL) 1.25 MG tablet Take 1 tablet (1.25 mg total) by mouth daily. 90 tablet 2   lisdexamfetamine (VYVANSE) 30 MG capsule Take 1 capsule (30 mg total) by mouth daily. 30 capsule 0   meloxicam (MOBIC) 15 MG tablet Take 15 mg by mouth daily as needed.     NIFEdipine (PROCARDIA XL/NIFEDICAL XL) 60 MG 24 hr tablet Take 1 tablet (60 mg total) by mouth daily. 90 tablet 2   Olopatadine HCl 0.2 % SOLN Apply 1 drop to eye daily as needed (itchy/watery eyes). 2.5 mL 5   ondansetron (ZOFRAN) 4 MG tablet Take 1 tablet (4 mg total) by mouth every 8 (eight) hours as needed for nausea or vomiting. 20 tablet 0   potassium chloride SA (KLOR-CON M) 20 MEQ tablet Take 1 tablet (20 mEq total) by mouth daily. 90 tablet 3   Prenatal Vit-Fe Fumarate-FA (PRENATAL MULTIVITAMIN) TABS tablet Take 1 tablet by mouth daily at 12 noon.      tiZANidine (ZANAFLEX) 4 MG tablet Take 4 mg by mouth at bedtime.     TRULANCE 3 MG TABS      valACYclovir (VALTREX) 1000 MG tablet TAKE ONE TABLET BY MOUTH DAILY 90 tablet 4   No current facility-administered medications on file prior to visit.    Review of Systems  Constitutional:  Positive for fatigue. Negative for chills and fever.  HENT:  Positive for congestion, ear pain (mild clogged, discomfort), sinus pressure and sore throat.   Respiratory:  Positive for cough (gradually getting worse, productive). Negative for shortness of breath and wheezing.   Gastrointestinal:  Negative for diarrhea and nausea.  Musculoskeletal:  Positive for myalgias.  Neurological:  Positive for light-headedness and headaches. Negative for dizziness.       Objective:   Vitals:   04/05/22 1014  BP: 118/68  Pulse: 85  Temp: 98.4 F (36.9 C)  SpO2: 99%   BP Readings from Last 3 Encounters:  04/05/22 118/68  03/26/22 128/72  03/16/22 (!) 135/92   Wt Readings from Last 3 Encounters:  04/05/22 167 lb (75.8 kg)  03/26/22 168 lb (76.2 kg)  03/14/22 168 lb 12.8 oz (76.6 kg)   Body mass index is 28.67 kg/m.    Physical Exam Constitutional:      General:  She is not in acute distress.    Appearance: Normal appearance. She is not ill-appearing.  HENT:     Head: Normocephalic and atraumatic.     Right Ear: Tympanic membrane, ear canal and external ear normal.     Left Ear: Tympanic membrane, ear canal and external ear normal.     Mouth/Throat:     Mouth: Mucous membranes are moist.     Pharynx: No oropharyngeal exudate or posterior oropharyngeal erythema.  Eyes:     Conjunctiva/sclera: Conjunctivae normal.  Cardiovascular:     Rate and Rhythm: Normal rate and regular rhythm.  Pulmonary:     Effort: Pulmonary effort is normal. No respiratory distress.     Breath sounds: Normal breath sounds. No wheezing or rales.  Musculoskeletal:     Cervical back: Neck supple. No tenderness.   Lymphadenopathy:     Cervical: No cervical adenopathy.  Skin:    General: Skin is warm and dry.  Neurological:     Mental Status: She is alert.            Assessment & Plan:    See Problem List for Assessment and Plan of chronic medical problems.

## 2022-04-05 NOTE — Patient Instructions (Addendum)
Use the cough syrup as needed for cough.   Return if symptoms worsen or fail to improve.    Upper Respiratory Infection, Adult An upper respiratory infection (URI) is a common viral infection of the nose, throat, and upper air passages that lead to the lungs. The most common type of URI is the common cold. URIs usually get better on their own, without medical treatment. What are the causes? A URI is caused by a virus. You may catch a virus by: Breathing in droplets from an infected person's cough or sneeze. Touching something that has been exposed to the virus (is contaminated) and then touching your mouth, nose, or eyes. What increases the risk? You are more likely to get a URI if: You are very young or very old. You have close contact with others, such as at work, school, or a health care facility. You smoke. You have long-term (chronic) heart or lung disease. You have a weakened disease-fighting system (immune system). You have nasal allergies or asthma. You are experiencing a lot of stress. You have poor nutrition. What are the signs or symptoms? A URI usually involves some of the following symptoms: Runny or stuffy (congested) nose. Cough. Sneezing. Sore throat. Headache. Fatigue. Fever. Loss of appetite. Pain in your forehead, behind your eyes, and over your cheekbones (sinus pain). Muscle aches. Redness or irritation of the eyes. Pressure in the ears or face. How is this diagnosed? This condition may be diagnosed based on your medical history and symptoms, and a physical exam. Your health care provider may use a swab to take a mucus sample from your nose (nasal swab). This sample can be tested to determine what virus is causing the illness. How is this treated? URIs usually get better on their own within 7-10 days. Medicines cannot cure URIs, but your health care provider may recommend certain medicines to help relieve symptoms, such as: Over-the-counter cold  medicines. Cough suppressants. Coughing is a type of defense against infection that helps to clear the respiratory system, so take these medicines only as recommended by your health care provider. Fever-reducing medicines. Follow these instructions at home: Activity Rest as needed. If you have a fever, stay home from work or school until your fever is gone or until your health care provider says your URI cannot spread to other people (is no longer contagious). Your health care provider may have you wear a face mask to prevent your infection from spreading. Relieving symptoms Gargle with a mixture of salt and water 3-4 times a day or as needed. To make salt water, completely dissolve -1 tsp (3-6 g) of salt in 1 cup (237 mL) of warm water. Use a cool-mist humidifier to add moisture to the air. This can help you breathe more easily. Eating and drinking  Drink enough fluid to keep your urine pale yellow. Eat soups and other clear broths. General instructions  Take over-the-counter and prescription medicines only as told by your health care provider. These include cold medicines, fever reducers, and cough suppressants. Do not use any products that contain nicotine or tobacco. These products include cigarettes, chewing tobacco, and vaping devices, such as e-cigarettes. If you need help quitting, ask your health care provider. Stay away from secondhand smoke. Stay up to date on all immunizations, including the yearly (annual) flu vaccine. Keep all follow-up visits. This is important. How to prevent the spread of infection to others URIs can be contagious. To prevent the infection from spreading: Wash your hands with  soap and water for at least 20 seconds. If soap and water are not available, use hand sanitizer. Avoid touching your mouth, face, eyes, or nose. Cough or sneeze into a tissue or your sleeve or elbow instead of into your hand or into the air.  Contact a health care provider if: You  are getting worse instead of better. You have a fever or chills. Your mucus is brown or red. You have yellow or brown discharge coming from your nose. You have pain in your face, especially when you bend forward. You have swollen neck glands. You have pain while swallowing. You have white areas in the back of your throat. Get help right away if: You have shortness of breath that gets worse. You have severe or persistent: Headache. Ear pain. Sinus pain. Chest pain. You have chronic lung disease along with any of the following: Making high-pitched whistling sounds when you breathe, most often when you breathe out (wheezing). Prolonged cough (more than 14 days). Coughing up blood. A change in your usual mucus. You have a stiff neck. You have changes in your: Vision. Hearing. Thinking. Mood. These symptoms may be an emergency. Get help right away. Call 911. Do not wait to see if the symptoms will go away. Do not drive yourself to the hospital. Summary An upper respiratory infection (URI) is a common infection of the nose, throat, and upper air passages that lead to the lungs. A URI is caused by a virus. URIs usually get better on their own within 7-10 days. Medicines cannot cure URIs, but your health care provider may recommend certain medicines to help relieve symptoms. This information is not intended to replace advice given to you by your health care provider. Make sure you discuss any questions you have with your health care provider. Document Revised: 12/14/2020 Document Reviewed: 12/14/2020 Elsevier Patient Education  [place].

## 2022-05-01 ENCOUNTER — Other Ambulatory Visit: Payer: Self-pay | Admitting: Internal Medicine

## 2022-08-05 ENCOUNTER — Encounter: Payer: Self-pay | Admitting: Internal Medicine

## 2022-08-05 DIAGNOSIS — R739 Hyperglycemia, unspecified: Secondary | ICD-10-CM | POA: Insufficient documentation

## 2022-08-05 DIAGNOSIS — R7303 Prediabetes: Secondary | ICD-10-CM | POA: Insufficient documentation

## 2022-08-05 NOTE — Patient Instructions (Addendum)
[facility] Phone: [phone]    Blood work was ordered.   The lab is on the first floor.    Medications changes include :   none     Return in about 6 months (around 02/06/2023) for follow up.   Health Maintenance, Female Adopting a healthy lifestyle and getting preventive care are important in promoting health and wellness. Ask your health care provider about: The right schedule for you to have regular tests and exams. Things you can do on your own to prevent diseases and keep yourself healthy. What should I know about diet, weight, and exercise? Eat a healthy diet  Eat a diet that includes plenty of vegetables, fruits, low-fat dairy products, and lean protein. Do not eat a lot of foods that are high in solid fats, added sugars, or sodium. Maintain a healthy weight Body mass index (BMI) is used to identify weight problems. It estimates body fat based on height and weight. Your health care provider can help determine your BMI and help you achieve or maintain a healthy weight. Get regular exercise Get regular exercise. This is one of the most important things you can do for your health. Most adults should: Exercise for at least 150 minutes each week. The exercise should increase your heart rate and make you sweat (moderate-intensity exercise). Do strengthening exercises at least twice a week. This is in addition to the moderate-intensity exercise. Spend less time sitting. Even light physical activity can be beneficial. Watch cholesterol and blood lipids Have your blood tested for lipids and cholesterol at 45 years of age, then have this test every 5 years. Have your cholesterol levels checked more often if: Your lipid or cholesterol levels are high. You are older than 45 years of age. You are at high risk for heart disease. What should I know about cancer screening? Depending on your health history and family history, you may need to have cancer screening at various  ages. This may include screening for: Breast cancer. Cervical cancer. Colorectal cancer. Skin cancer. Lung cancer. What should I know about heart disease, diabetes, and high blood pressure? Blood pressure and heart disease High blood pressure causes heart disease and increases the risk of stroke. This is more likely to develop in people who have high blood pressure readings or are overweight. Have your blood pressure checked: Every 3-5 years if you are 35-57 years of age. Every year if you are 15 years old or older. Diabetes Have regular diabetes screenings. This checks your fasting blood sugar level. Have the screening done: Once every three years after age 60 if you are at a normal weight and have a low risk for diabetes. More often and at a younger age if you are overweight or have a high risk for diabetes. What should I know about preventing infection? Hepatitis B If you have a higher risk for hepatitis B, you should be screened for this virus. Talk with your health care provider to find out if you are at risk for hepatitis B infection. Hepatitis C Testing is recommended for: Everyone born from [birth year] through 1963. Anyone with known risk factors for hepatitis C. Sexually transmitted infections (STIs) Get screened for STIs, including gonorrhea and chlamydia, if: You are sexually active and are younger than 45 years of age. You are older than 45 years of age and your health care provider tells you that you are at risk for this type of infection. Your sexual activity has changed since you  were last screened, and you are at increased risk for chlamydia or gonorrhea. Ask your health care provider if you are at risk. Ask your health care provider about whether you are at high risk for HIV. Your health care provider may recommend a prescription medicine to help prevent HIV infection. If you choose to take medicine to prevent HIV, you should first get tested for HIV. You should then be tested  every 3 months for as long as you are taking the medicine. Pregnancy If you are about to stop having your period (premenopausal) and you may become pregnant, seek counseling before you get pregnant. Take 400 to 800 micrograms (mcg) of folic acid every day if you become pregnant. Ask for birth control (contraception) if you want to prevent pregnancy. Osteoporosis and menopause Osteoporosis is a disease in which the bones lose minerals and strength with aging. This can result in bone fractures. If you are 76 years old or older, or if you are at risk for osteoporosis and fractures, ask your health care provider if you should: Be screened for bone loss. Take a calcium or vitamin D supplement to lower your risk of fractures. Be given hormone replacement therapy (HRT) to treat symptoms of menopause. Follow these instructions at home: Alcohol use Do not drink alcohol if: Your health care provider tells you not to drink. You are pregnant, may be pregnant, or are planning to become pregnant. If you drink alcohol: Limit how much you have to: 0-1 drink a day. Know how much alcohol is in your drink. In the [place] one drink equals one 12 oz bottle of beer (355 mL), one 5 oz glass of wine (148 mL), or one 1 oz glass of hard liquor (44 mL). Lifestyle Do not use any products that contain nicotine or tobacco. These products include cigarettes, chewing tobacco, and vaping devices, such as e-cigarettes. If you need help quitting, ask your health care provider. Do not use street drugs. Do not share needles. Ask your health care provider for help if you need support or information about quitting drugs. General instructions Schedule regular health, dental, and eye exams. Stay current with your vaccines. Tell your health care provider if: You often feel depressed. You have ever been abused or do not feel safe at home. Summary Adopting a healthy lifestyle and getting preventive care are important in promoting  health and wellness. Follow your health care provider's instructions about healthy diet, exercising, and getting tested or screened for diseases. Follow your health care provider's instructions on monitoring your cholesterol and blood pressure. This information is not intended to replace advice given to you by your health care provider. Make sure you discuss any questions you have with your health care provider. Document Revised: 10/03/2020 Document Reviewed: 10/03/2020 Elsevier Patient Education  [place].

## 2022-08-06 ENCOUNTER — Ambulatory Visit (INDEPENDENT_AMBULATORY_CARE_PROVIDER_SITE_OTHER): Payer: Medicaid Other | Admitting: Internal Medicine

## 2022-08-06 VITALS — BP 128/80 | HR 77 | Temp 98.3°F | Ht 64.0 in | Wt 182.6 lb

## 2022-08-06 DIAGNOSIS — E782 Mixed hyperlipidemia: Secondary | ICD-10-CM | POA: Diagnosis not present

## 2022-08-06 DIAGNOSIS — T502X5A Adverse effect of carbonic-anhydrase inhibitors, benzothiadiazides and other diuretics, initial encounter: Secondary | ICD-10-CM | POA: Diagnosis not present

## 2022-08-06 DIAGNOSIS — Z0000 Encounter for general adult medical examination without abnormal findings: Secondary | ICD-10-CM

## 2022-08-06 DIAGNOSIS — K5909 Other constipation: Secondary | ICD-10-CM | POA: Diagnosis not present

## 2022-08-06 DIAGNOSIS — F988 Other specified behavioral and emotional disorders with onset usually occurring in childhood and adolescence: Secondary | ICD-10-CM

## 2022-08-06 DIAGNOSIS — E876 Hypokalemia: Secondary | ICD-10-CM

## 2022-08-06 DIAGNOSIS — E66811 Obesity, class 1: Secondary | ICD-10-CM

## 2022-08-06 DIAGNOSIS — I10 Essential (primary) hypertension: Secondary | ICD-10-CM

## 2022-08-06 DIAGNOSIS — R739 Hyperglycemia, unspecified: Secondary | ICD-10-CM | POA: Diagnosis not present

## 2022-08-06 DIAGNOSIS — Z833 Family history of diabetes mellitus: Secondary | ICD-10-CM

## 2022-08-06 DIAGNOSIS — G43109 Migraine with aura, not intractable, without status migrainosus: Secondary | ICD-10-CM | POA: Diagnosis not present

## 2022-08-06 DIAGNOSIS — E669 Obesity, unspecified: Secondary | ICD-10-CM

## 2022-08-06 LAB — COMPREHENSIVE METABOLIC PANEL
ALT: 18 U/L (ref 0–35)
AST: 22 U/L (ref 0–37)
Albumin: 4.3 g/dL (ref 3.5–5.2)
Alkaline Phosphatase: 62 U/L (ref 39–117)
BUN: 14 mg/dL (ref 6–23)
CO2: 31 mEq/L (ref 19–32)
Calcium: 10 mg/dL (ref 8.4–10.5)
Chloride: 99 mEq/L (ref 96–112)
Creatinine, Ser: 0.79 mg/dL (ref 0.40–1.20)
GFR: 90.63 mL/min (ref >60.00)
Glucose, Bld: 76 mg/dL (ref 70–99)
Potassium: 3.6 mEq/L (ref 3.5–5.1)
Sodium: 140 mEq/L (ref 135–145)
Total Bilirubin: 1.1 mg/dL (ref 0.2–1.2)
Total Protein: 7.2 g/dL (ref 6.0–8.3)

## 2022-08-06 LAB — CBC WITH DIFFERENTIAL/PLATELET
Basophils Absolute: 0 10*3/uL (ref 0.0–0.1)
Basophils Relative: 0.5 % (ref 0.0–3.0)
Eosinophils Absolute: 0 10*3/uL (ref 0.0–0.7)
Eosinophils Relative: 0.9 % (ref 0.0–5.0)
HCT: 40.4 % (ref 36.0–46.0)
Hemoglobin: 14 g/dL (ref 12.0–15.0)
Lymphocytes Relative: 44.5 % (ref 12.0–46.0)
Lymphs Abs: 2.3 10*3/uL (ref 0.7–4.0)
MCHC: 34.8 g/dL (ref 30.0–36.0)
MCV: 84.6 fl (ref 78.0–100.0)
Monocytes Absolute: 0.6 10*3/uL (ref 0.1–1.0)
Monocytes Relative: 12.6 % — ABNORMAL HIGH (ref 3.0–12.0)
Neutro Abs: 2.1 10*3/uL (ref 1.4–7.7)
Neutrophils Relative %: 41.5 % — ABNORMAL LOW (ref 43.0–77.0)
Platelets: 264 10*3/uL (ref 150.0–400.0)
RBC: 4.77 Mil/uL (ref 3.87–5.11)
RDW: 14.1 % (ref 11.5–15.5)
WBC: 5.1 10*3/uL (ref 4.0–10.5)

## 2022-08-06 LAB — LIPID PANEL
Cholesterol: 263 mg/dL — ABNORMAL HIGH (ref 0–200)
HDL: 90.6 mg/dL (ref >39.00)
LDL Cholesterol: 159 mg/dL — ABNORMAL HIGH (ref 0–99)
NonHDL: 172.87
Total CHOL/HDL Ratio: 3
Triglycerides: 69 mg/dL (ref 0.0–149.0)
VLDL: 13.8 mg/dL (ref 0.0–40.0)

## 2022-08-06 LAB — TSH: TSH: 2.03 u[IU]/mL (ref 0.35–5.50)

## 2022-08-06 LAB — HEMOGLOBIN A1C: Hgb A1c MFr Bld: 5.6 % (ref 4.6–6.5)

## 2022-08-06 MED ORDER — POTASSIUM CHLORIDE CRYS ER 20 MEQ PO TBCR: 20.0000 meq | EXTENDED_RELEASE_TABLET | Freq: Every day | ORAL | 3 refills | Status: DC

## 2022-08-06 MED ORDER — LISDEXAMFETAMINE DIMESYLATE 30 MG PO CAPS: 30.0000 mg | ORAL_CAPSULE | Freq: Every day | ORAL | 0 refills | Status: DC

## 2022-08-06 MED ORDER — INDAPAMIDE 1.25 MG PO TABS: 1.2500 mg | ORAL_TABLET | Freq: Every day | ORAL | 2 refills | Status: DC

## 2022-08-06 NOTE — Progress Notes (Signed)
Subjective:    Patient ID: Denise [name], female    DOB: 1975/10/02, 47 y.o.   MRN: [id number]      HPI Denise [name] is here for a Physical exam and her chronic medical problems.   She is concerned about her weight gain.  She did realize she had gained his much weight and she has until she stepped on the scale today.  She is constantly tired.   Doing intermittent fasting.  Does protein shakes, which is new.    Occ pop feeling in right forearm with certain movements.  Was occurring more often and now occurring once in a while.    Medications and allergies reviewed with patient and updated if appropriate.  Current Outpatient Medications on File Prior to Visit  Medication Sig Dispense Refill   cetirizine (ZYRTEC ALLERGY) 10 MG tablet Take 1 tablet (10 mg total) by mouth 2 (two) times daily as needed (hives). 60 tablet 5   famotidine (PEPCID) 20 MG tablet Take 20 mg by mouth as needed for heartburn or indigestion.     fluticasone (FLONASE) 50 MCG/ACT nasal spray Place 1 spray into both nostrils 2 (two) times daily as needed for allergies or rhinitis. 16 g 5   meloxicam (MOBIC) 15 MG tablet Take 15 mg by mouth daily as needed.     NIFEdipine (PROCARDIA XL/NIFEDICAL XL) 60 MG 24 hr tablet TAKE ONE TABLET BY MOUTH DAILY 90 tablet 2   Olopatadine HCl 0.2 % SOLN Apply 1 drop to eye daily as needed (itchy/watery eyes). 2.5 mL 5   ondansetron (ZOFRAN) 4 MG tablet Take 1 tablet (4 mg total) by mouth every 8 (eight) hours as needed for nausea or vomiting. 20 tablet 0   Prenatal Vit-Fe Fumarate-FA (PRENATAL MULTIVITAMIN) TABS tablet Take 1 tablet by mouth daily at 12 noon.     tiZANidine (ZANAFLEX) 4 MG tablet Take 4 mg by mouth at bedtime.     TRULANCE 3 MG TABS      valACYclovir (VALTREX) 1000 MG tablet TAKE ONE TABLET BY MOUTH DAILY 90 tablet 4   No current facility-administered medications on file prior to visit.    Review of Systems  Constitutional:  Negative for fever.  Eyes:  Negative  for visual disturbance.  Respiratory:  Negative for cough, shortness of breath and wheezing.   Cardiovascular:  Positive for palpitations (occ) and leg swelling. Negative for chest pain.  Gastrointestinal:  Positive for anal bleeding (hemorrhoidal) and constipation (intermittent). Negative for abdominal pain, blood in stool and diarrhea.       No gerd  Genitourinary:  Negative for dysuria.  Musculoskeletal:  Negative for arthralgias and back pain.  Skin:  Negative for rash.  Neurological:  Positive for headaches. Negative for light-headedness.  Psychiatric/Behavioral:  Negative for dysphoric mood. The patient is nervous/anxious.        Objective:   Vitals:   08/06/22 1009  BP: 128/80  Pulse: 77  Temp: 98.3 F (36.8 C)  SpO2: 98%   Filed Weights   08/06/22 1009  Weight: 182 lb 9.6 oz (82.8 kg)   Body mass index is 31.34 kg/m.  BP Readings from Last 3 Encounters:  08/06/22 128/80  04/05/22 118/68  03/26/22 128/72    Wt Readings from Last 3 Encounters:  08/06/22 182 lb 9.6 oz (82.8 kg)  04/05/22 167 lb (75.8 kg)  03/26/22 168 lb (76.2 kg)       Physical Exam Constitutional: She appears well-developed and well-nourished. No distress.  HENT:  Head: Normocephalic and atraumatic.  Right Ear: External ear normal. Normal ear canal and TM Left Ear: External ear normal.  Normal ear canal and TM Mouth/Throat: Oropharynx is clear and moist.  Eyes: Conjunctivae normal.  Neck: Neck supple. No tracheal deviation present. No thyromegaly present.  No carotid bruit  Cardiovascular: Normal rate, regular rhythm and normal heart sounds.   No murmur heard.  No edema. Pulmonary/Chest: Effort normal and breath sounds normal. No respiratory distress. She has no wheezes. She has no rales.  Breast: deferred   Abdominal: Soft. She exhibits no distension. There is no tenderness.  Lymphadenopathy: She has no cervical adenopathy.  Skin: Skin is warm and dry. She is not diaphoretic.   Psychiatric: She has a normal mood and affect. Her behavior is normal.     Lab Results  Component Value Date   WBC 7.1 03/16/2022   HGB 14.4 03/16/2022   HCT 39.2 03/16/2022   PLT 297 03/16/2022   GLUCOSE 94 03/16/2022   CHOL 257 (H) 07/31/2021   TRIG 56.0 07/31/2021   HDL 86.10 07/31/2021   LDLCALC 160 (H) 07/31/2021   ALT 10 03/16/2022   AST 17 03/16/2022   NA 139 03/16/2022   K 3.4 (L) 03/16/2022   CL 100 03/16/2022   CREATININE 1.07 (H) 03/16/2022   BUN 16 03/16/2022   CO2 26 03/16/2022   TSH 1.22 07/31/2021   INR 1.0 07/26/2020   HGBA1C 5.4 01/27/2021         Assessment & Plan:   Physical exam: Screening blood work  ordered Exercise  [place] beach body - combination of cardio/weights.  Weight  - obese - wants to lose weight-discussed decreasing portions/calories.  She is consuming more calories than she realizes especially recently, but not sure what all the causes of the weight gain was.  Encouraged her to think about this so she knows how to change things.  Discussed doing a little bit of calorie counting just to see where she is getting her calories.  Discussed concerns with eating out too much.  She does not drink any of her calories except for shakes. Substance abuse  none   Reviewed recommended immunizations.   Health Maintenance  Topic Date Due   COVID-19 Vaccine (3 - 2023-24 season) 08/22/2022 (Originally 01/26/2022)   INFLUENZA VACCINE  08/26/2022 (Originally 12/26/2021)   HPV VACCINES (2 - 3-dose SCDM series) 03/15/2023 (Originally 03/25/2018)   DTaP/Tdap/Td (2 - Td or Tdap) 07/16/2023   PAP SMEAR-Modifier  03/01/2024   Hepatitis C Screening  Completed   HIV Screening  Completed          See Problem List for Assessment and Plan of chronic medical problems.

## 2022-08-06 NOTE — Assessment & Plan Note (Signed)
Chronic Check a1c Low sugar / carb diet Stressed regular exercise  

## 2022-08-06 NOTE — Assessment & Plan Note (Signed)
Chronic Related to indapamide CMP Continue potassium chloride 20 mill equivalents daily

## 2022-08-06 NOTE — Assessment & Plan Note (Addendum)
Chronic Taking trulance as needed - only has a few left - needs to establish with new GI doc-has already been referred-will call to make an appointment Needs trulance daily

## 2022-08-06 NOTE — Assessment & Plan Note (Signed)
new She has been weight since she was here last and is not sure exactly why Discussed possible causes.  She will reflect upon the past several weeks to try to figure out what led to the weight gain so she knows what to change Currently consuming more calories than she needs most likely-discussed decreasing calorie intake Continue regular exercise

## 2022-08-06 NOTE — Assessment & Plan Note (Signed)
Chronic Frequent migraines Was seeing specialist - no longer seeing them

## 2022-08-06 NOTE — Assessment & Plan Note (Signed)
Chronic Regular exercise and healthy diet encouraged Check lipid panel ASCVD risk is low Continue lifestyle control 

## 2022-08-06 NOTE — Assessment & Plan Note (Signed)
Chronic Blood pressure well controlled CMP, CBC, TSH Continue indapamide 1.25 mg daily, nifedipine XL 60 mg daily

## 2022-08-06 NOTE — Assessment & Plan Note (Addendum)
Chronic Controlled, Stable Has been taking Vyvanse only as needed, has not taken it recently Can restart Vyvanse 30 mg daily or take as needed

## 2022-08-07 ENCOUNTER — Encounter: Payer: Self-pay | Admitting: Internal Medicine

## 2022-09-03 ENCOUNTER — Telehealth: Payer: Self-pay | Admitting: Internal Medicine

## 2022-09-03 NOTE — Telephone Encounter (Signed)
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  Patient wanting to transfer care due to Dr Kinnie Scales retiring from [facility].looking to reestablish .  Records are available for review in epic. Please review and advise on scheduling.  Thank you

## 2022-09-03 NOTE — Telephone Encounter (Signed)
Looks like patient has been followed in GI clinic for constipation. The reports of her last colonoscopy and EGD are not available in Epic so please try to obtain. Okay to schedule for OV in the meantime though.

## 2022-09-04 ENCOUNTER — Encounter: Payer: Self-pay | Admitting: Internal Medicine

## 2022-09-04 ENCOUNTER — Ambulatory Visit: Payer: Medicaid Other | Admitting: Internal Medicine

## 2022-09-04 VITALS — BP 120/76 | HR 90 | Temp 98.5°F | Ht 64.0 in | Wt 174.0 lb

## 2022-09-04 DIAGNOSIS — K645 Perianal venous thrombosis: Secondary | ICD-10-CM | POA: Diagnosis not present

## 2022-09-04 MED ORDER — HYDROCORTISONE 2.5 % EX CREA: TOPICAL_CREAM | Freq: Two times a day (BID) | CUTANEOUS | 0 refills | Status: AC

## 2022-09-04 MED ORDER — HYDROCORT-PRAMOXINE (PERIANAL) 1-1 % EX FOAM: 1.0000 | Freq: Two times a day (BID) | CUTANEOUS | 2 refills | Status: AC

## 2022-09-04 MED ORDER — OLOPATADINE HCL 0.2 % OP SOLN: 1.0000 [drp] | Freq: Every day | OPHTHALMIC | 5 refills | Status: DC | PRN

## 2022-09-04 MED ORDER — POTASSIUM CHLORIDE CRYS ER 20 MEQ PO TBCR: 20.0000 meq | EXTENDED_RELEASE_TABLET | Freq: Every day | ORAL | 3 refills | Status: DC

## 2022-09-04 NOTE — Patient Instructions (Addendum)
     Try warm baths.     Medications changes include :   proctofoam-hc foam and hydrocortisone 2.5% cream     Return if symptoms worsen or fail to improve.

## 2022-09-04 NOTE — Progress Notes (Signed)
Subjective:    Patient ID: Denise [name], female    DOB: 1975/07/01, 47 y.o.   MRN: [id number]      HPI Denise [name] is here for  Chief Complaint  Patient presents with   Hemorrhoids    Noticed last week Tuesday night and has been painful then before , she has a picture of it      Notieced it last month - intermittent and last week reoccurred and has not gone away.  Was using preparation-H wipes.  Last week she felt a bulge.  It became painful.  The end of last week it was very painful.  Thursday had increased pain, did not go to the bathroom all day, had difficulty passing gas and was nauseous.  That pain has improved.    She took the valtrex just in case there was a herpes outbreak. Some of her pain improved.  Increased pressure with coughing or having a BM.   Less pain with having a BM but more with touching it.     Medications and allergies reviewed with patient and updated if appropriate.  Current Outpatient Medications on File Prior to Visit  Medication Sig Dispense Refill   cetirizine (ZYRTEC ALLERGY) 10 MG tablet Take 1 tablet (10 mg total) by mouth 2 (two) times daily as needed (hives). 60 tablet 5   famotidine (PEPCID) 20 MG tablet Take 20 mg by mouth as needed for heartburn or indigestion.     fluticasone (FLONASE) 50 MCG/ACT nasal spray Place 1 spray into both nostrils 2 (two) times daily as needed for allergies or rhinitis. 16 g 5   indapamide (LOZOL) 1.25 MG tablet Take 1 tablet (1.25 mg total) by mouth daily. 90 tablet 2   lisdexamfetamine (VYVANSE) 30 MG capsule Take 1 capsule (30 mg total) by mouth daily. 30 capsule 0   meloxicam (MOBIC) 15 MG tablet Take 15 mg by mouth daily as needed.     NIFEdipine (PROCARDIA XL/NIFEDICAL XL) 60 MG 24 hr tablet TAKE ONE TABLET BY MOUTH DAILY 90 tablet 2   Olopatadine HCl 0.2 % SOLN Apply 1 drop to eye daily as needed (itchy/watery eyes). 2.5 mL 5   ondansetron (ZOFRAN) 4 MG tablet Take 1 tablet (4 mg total) by mouth every 8  (eight) hours as needed for nausea or vomiting. 20 tablet 0   potassium chloride SA (KLOR-CON M) 20 MEQ tablet Take 1 tablet (20 mEq total) by mouth daily. 90 tablet 3   Prenatal Vit-Fe Fumarate-FA (PRENATAL MULTIVITAMIN) TABS tablet Take 1 tablet by mouth daily at 12 noon.     tiZANidine (ZANAFLEX) 4 MG tablet Take 4 mg by mouth at bedtime.     TRULANCE 3 MG TABS      valACYclovir (VALTREX) 1000 MG tablet TAKE ONE TABLET BY MOUTH DAILY 90 tablet 4   No current facility-administered medications on file prior to visit.    Review of Systems     Objective:   Vitals:   09/04/22 1333  BP: 120/76  Pulse: 90  Temp: 98.5 F (36.9 C)  SpO2: 99%   BP Readings from Last 3 Encounters:  09/04/22 120/76  08/06/22 128/80  04/05/22 118/68   Wt Readings from Last 3 Encounters:  09/04/22 174 lb (78.9 kg)  08/06/22 182 lb 9.6 oz (82.8 kg)  04/05/22 167 lb (75.8 kg)   Body mass index is 29.87 kg/m.    Physical Exam Constitutional:      General: She is not in acute distress.  Appearance: Normal appearance. She is not ill-appearing.  Genitourinary:    Comments: Thrombosed hemorrhoid - tender, no rectal exam performed Skin:    General: Skin is warm and dry.  Neurological:     Mental Status: She is alert.            Assessment & Plan:    See Problem List for Assessment and Plan of chronic medical problems.

## 2022-09-04 NOTE — Assessment & Plan Note (Signed)
Acute  Started last month - much worse last week Slight improvement in past couple of days Start procto-foam or hydrocortisone 2.5 % cream  Keep stools soft and regular Warm baths Reassured her this will improve over the next week or so To establish with GI and can discuss banding for future since the hemorrhoid has been a recurring issue - first time it has been thrombosed Call if no improvement

## 2022-09-06 ENCOUNTER — Encounter: Payer: Self-pay | Admitting: Internal Medicine

## 2022-09-06 MED ORDER — LISDEXAMFETAMINE DIMESYLATE 30 MG PO CAPS: 30.0000 mg | ORAL_CAPSULE | Freq: Every day | ORAL | 0 refills | Status: DC

## 2022-09-07 ENCOUNTER — Encounter: Payer: Self-pay | Admitting: Internal Medicine

## 2022-09-07 NOTE — Telephone Encounter (Signed)
Left detailed message for patient to call back and schedule.

## 2022-09-07 NOTE — Telephone Encounter (Signed)
Patient returned phone call, scheduled for office visit.

## 2022-09-18 ENCOUNTER — Encounter: Payer: Self-pay | Admitting: Internal Medicine

## 2022-09-18 ENCOUNTER — Ambulatory Visit: Payer: Medicaid Other | Admitting: Internal Medicine

## 2022-09-18 ENCOUNTER — Telehealth: Payer: Self-pay

## 2022-09-18 VITALS — BP 120/80 | HR 67 | Ht 64.0 in | Wt 175.0 lb

## 2022-09-18 DIAGNOSIS — K6289 Other specified diseases of anus and rectum: Secondary | ICD-10-CM

## 2022-09-18 DIAGNOSIS — K219 Gastro-esophageal reflux disease without esophagitis: Secondary | ICD-10-CM | POA: Diagnosis not present

## 2022-09-18 DIAGNOSIS — K5900 Constipation, unspecified: Secondary | ICD-10-CM | POA: Diagnosis not present

## 2022-09-18 DIAGNOSIS — K649 Unspecified hemorrhoids: Secondary | ICD-10-CM

## 2022-09-18 DIAGNOSIS — R1310 Dysphagia, unspecified: Secondary | ICD-10-CM

## 2022-09-18 MED ORDER — TRULANCE 3 MG PO TABS: 3.0000 mg | ORAL_TABLET | Freq: Every day | ORAL | 2 refills | Status: DC

## 2022-09-18 MED ORDER — NA SULFATE-K SULFATE-MG SULF 17.5-3.13-1.6 GM/177ML PO SOLN: ORAL | 0 refills | Status: DC

## 2022-09-18 MED ORDER — FAMOTIDINE 20 MG PO TABS: 20.0000 mg | ORAL_TABLET | Freq: Two times a day (BID) | ORAL | 2 refills | Status: DC | PRN

## 2022-09-18 NOTE — Progress Notes (Signed)
Chief Complaint: Constipation, abdominal pain  HPI : 47 year old female with history of GERD, IBS, migraine, anemia presents with constipation and abdominal pain  She has had alternating constipation and diarrhea since 2007. She had diarrhea more initially but has had more constipation recently. Bowel habits are irregular. Sometimes she will experience fecal urgency. When her constipation is more severe, she will experience some abdominal discomfort and nausea. She used to be on Linzess, which caused too much cramping and urgent stools, which was not sustainable. She used Trulance, which seemed to work (not as quickly as the [employer] but was more tolerable), but then stopped after she didn't see her prior GI provider Dr. Kinnie Scales for a few years. Miralax in the past has not worked. She does strain when having a BM. She has had hemorrhoids for the last 9 years (after the birth of her son), but they recently flared up. Her rectal pain was excruciating on 4/2-09/02/22. She could barely pass gas when the pain was severe. The pain has decreased in severity from a 10 to a 7.5. She saw her PCP on 09/04/22. She tried the Anusol HC cream prescribed by her PCP, which seemed to help. The Procto-foam was difficult to administer. Her last colonoscopy in 2021 showed a colon polyp. Prior to that, she had a colonoscopy 2012-2013 that showed a colon polyp. Her EGD were in 2021 was normal. She was recommended for 5 year follow up colonoscopy. She used to be on famotidine PRN for acid reflux but stopped this as well after she stopped seeing her prior GI provider. She does occasionally have trouble swallowing spit and foods. Denies vomiting or weight loss. Patient has family history of IBS, GERD, and hiatal hernia. Denies family history of colon cancer.  Past Medical History:  Diagnosis Date   Anemia    during pregnancy    Chronic headaches    Colon polyps    Complication of anesthesia    crying excessive spitting    Concussion age 38 or 24 no residual   Family history of adverse reaction to anesthesia    ponv   GERD (gastroesophageal reflux disease)    GERD (gastroesophageal reflux disease)    HSV-2 infection    2008   Hypertension    IBS (irritable bowel syndrome)    IC (interstitial cystitis)    2008   Migraine    Wears glasses      Past Surgical History:  Procedure Laterality Date   ADENOIDECTOMY  as child   BREAST SURGERY  2002   right breast biopsy Benign   CESAREAN SECTION N/A 08/30/2013   Procedure: CESAREAN SECTION;  Surgeon: Serita Kyle, MD;  Location: [facility];  Service: Obstetrics;  Laterality: N/A;   colonscopy  04/18/2020   and endoscopy done   DILATION AND EVACUATION N/A 06/23/2020   Procedure: DILATATION AND EVACUATION;  Surgeon: Jerene Bears, MD;  Location: [facility];  Service: Gynecology;  Laterality: N/A;   KNEE ARTHROSCOPY  3/11, 2012   right knee   SHOULDER ARTHROSCOPY Right 2010   TONSILLECTOMY  as child   and adenoids   WISDOM TOOTH EXTRACTION  2008   Family History  Problem Relation Age of Onset   Diabetes Mother    Hyperlipidemia Mother    Anemia Mother    Irritable bowel syndrome Mother    Hyperlipidemia Father    Hypertension Father    Heart disease Father    Colon polyps Father  Kidney disease Father    Stroke Sister    Hypertension Sister    Kidney disease Sister    Hypertension Sister    Thyroid disease Sister    Heart disease Brother    Diabetes Maternal Grandmother    Heart failure Maternal Grandmother    Prostate cancer Maternal Grandfather    Diabetes Paternal Grandmother    Stroke Paternal Grandmother    Renal Disease Paternal Grandfather    Heart disease Paternal Grandfather    Stroke Paternal Grandfather    Anemia Maternal Aunt    Prostate cancer Paternal Uncle    Kidney disease Maternal Uncle    Colon cancer Neg Hx    Stomach cancer Neg Hx    Esophageal cancer Neg Hx    Social History   Tobacco Use    Smoking status: Never   Smokeless tobacco: Never  Vaping Use   Vaping Use: Never used  Substance Use Topics   Alcohol use: Not Currently   Drug use: Yes    Types: Marijuana    Comment: marijuana last use sept 2021   Current Outpatient Medications  Medication Sig Dispense Refill   cetirizine (ZYRTEC ALLERGY) 10 MG tablet Take 1 tablet (10 mg total) by mouth 2 (two) times daily as needed (hives). 60 tablet 5   famotidine (PEPCID) 20 MG tablet Take 20 mg by mouth as needed for heartburn or indigestion.     fluticasone (FLONASE) 50 MCG/ACT nasal spray Place 1 spray into both nostrils 2 (two) times daily as needed for allergies or rhinitis. 16 g 5   hydrocortisone 2.5 % cream Apply topically 2 (two) times daily. 30 g 0   hydrocortisone-pramoxine (PROCTOFOAM-HC) rectal foam Place 1 applicator rectally 2 (two) times daily. 10 g 2   indapamide (LOZOL) 1.25 MG tablet Take 1 tablet (1.25 mg total) by mouth daily. 90 tablet 2   lisdexamfetamine (VYVANSE) 30 MG capsule Take 1 capsule (30 mg total) by mouth daily. 30 capsule 0   meloxicam (MOBIC) 15 MG tablet Take 15 mg by mouth daily as needed.     NIFEdipine (PROCARDIA XL/NIFEDICAL XL) 60 MG 24 hr tablet TAKE ONE TABLET BY MOUTH DAILY 90 tablet 2   Olopatadine HCl 0.2 % SOLN Apply 1 drop to eye daily as needed (itchy/watery eyes). 2.5 mL 5   ondansetron (ZOFRAN) 4 MG tablet Take 1 tablet (4 mg total) by mouth every 8 (eight) hours as needed for nausea or vomiting. 20 tablet 0   potassium chloride SA (KLOR-CON M) 20 MEQ tablet Take 1 tablet (20 mEq total) by mouth daily. 90 tablet 3   Prenatal Vit-Fe Fumarate-FA (PRENATAL MULTIVITAMIN) TABS tablet Take 1 tablet by mouth daily at 12 noon.     tiZANidine (ZANAFLEX) 4 MG tablet Take 4 mg by mouth at bedtime.     TRULANCE 3 MG TABS      valACYclovir (VALTREX) 1000 MG tablet TAKE ONE TABLET BY MOUTH DAILY 90 tablet 4   No current facility-administered medications for this visit.   Allergies   Allergen Reactions   Ace Inhibitors     Swelling og tongue and lips   Adhesive [Tape] Rash   Latex Itching and Rash    Rash and itching from use of gloves    Review of Systems: All systems reviewed and negative except where noted in HPI.   Physical Exam: BP 120/80   Pulse 67   Ht  (1.626 m)   Wt 175 lb (79.4 kg)  BMI 30.04 kg/m  Constitutional: Pleasant,well-developed, female in no acute distress. HEENT: Normocephalic and atraumatic. Conjunctivae are normal. No scleral icterus. Cardiovascular: Normal rate, regular rhythm.  Pulmonary/chest: Effort normal and breath sounds normal. No wheezing, rales or rhonchi. Abdominal: Soft, nondistended, nontender. Bowel sounds active throughout. There are no masses palpable. No hepatomegaly. Rectal: Grade 3 internal hemorrhoids. Extremities: No edema Neurological: Alert and oriented to person place and time. Skin: Skin is warm and dry. No rashes noted. Psychiatric: Normal mood and affect. Behavior is normal.  Labs 02/2022 : Lipase nml.   Labs 07/2022: CBC unremarkable. CMP nml. TSH nml. HbA1C 5.6%  CTA C/A/P w/contrast 03/16/22: IMPRESSION: 1. No acute intrathoracic pathology. No aortic aneurysm or dissection. 2. Findings suspicious for right pyelonephritis. Correlation with urinalysis recommended. No drainable fluid collection or abscess.  ASSESSMENT AND PLAN: Constipation Rectal pain Hemorrhoids GERD Mild dysphagia Patient presents with longstanding constipation and a recent exacerbation of her hemorrhoids.  I did see prolapsed hemorrhoids on her rectal exam today.  Recommended that she continue using her Anusol HC cream for at least 1 more week.  She should try to avoid straining when passing BMs and use Sitz baths and Recticare as needed for symptomatic relief.  I went over conservative strategies to help her with constipation and will restart her on Trulance therapy, which has been effective for constipation in the past.   Will also go ahead and restart her on famotidine as needed to help with reflux symptoms.  To make sure that there are no other causes of rectal pain, will plan for a colonoscopy for further evaluation.  She did describe some occasional dysphagia today so we will add on a EGD at the same time as her colonoscopy procedure. - Avoid straining. Use Sitz baths. - Continue Anusol HC cream BID for 1 week - Recticare PRN. Will give Recticare samples - Drink 8 cups of water per day, walk 30 min per day, daily fiber supplement with Metamucil - Start Trulance 3 mg QD - Use famotidine 20 mg BID PRN - EGD/colonoscopy [facility] - Will get records of her last EGD and colonoscopy from Dr. Kinnie Scales in 2021  Eulah Pont, MD  I spent 68 minutes of time, including in depth chart review, independent review of results as outlined above, communicating results with the patient directly, face-to-face time with the patient, coordinating care, ordering studies and medications as appropriate, and documentation.

## 2022-09-18 NOTE — Patient Instructions (Addendum)
You have been scheduled for an endoscopy and colonoscopy. Please follow the written instructions given to you at your visit today. Please pick up your prep supplies at the pharmacy within the next 1-3 days. If you use inhalers (even only as needed), please bring them with you on the day of your procedure.   We have sent the following medications to your pharmacy for you to pick up at your convenience: Famotidine,Trulance, Suprep  If your blood pressure at your visit was 140/90 or greater, please contact your primary care physician to follow up on this.  If you are age 45 or older, your body mass index should be between 23-30. Your Body mass index is 30.04 kg/m. If this is out of the aforementioned range listed, please consider follow up with your Primary Care Provider.  If you are age 108 or younger, your body mass index should be between 19-25. Your Body mass index is 30.04 kg/m. If this is out of the aformentioned range listed, please consider follow up with your Primary Care Provider.    The [facility] providers would like to encourage you to use [facility] to communicate with providers for non-urgent requests or questions.  Due to long hold times on the telephone, sending your provider a message by [facility] may be a faster and more efficient way to get a response.  Please allow 48 business hours for a response.  Please remember that this is for non-urgent requests.  _______________________________________________________   Due to recent changes in healthcare laws, you may see the results of your imaging and laboratory studies on MyChart before your provider has had a chance to review them.  We understand that in some cases there may be results that are confusing or concerning to you. Not all laboratory results come back in the same time frame and the provider may be waiting for multiple results in order to interpret others.  Please give [place] 48 hours in order for your provider to thoroughly review all  the results before contacting the office for clarification of your results.     Drink 8 cups of water a day and walk 30 minutes a day.  Please purchase the following medications over the counter and take as directed: Fiber supplement such as Benefiber- use as directed daily Miralax: Take as  directed up to 3 times a day to achieve regular bowel movements   Thank you for entrusting me with your care and for choosing [employer], Dr. Eulah Pont

## 2022-09-18 NOTE — Telephone Encounter (Signed)
PA request received via CMM for Trulance  tablets  PA has been submitted to [facility] Medicaid and has been APPROVED from 09/18/2022-09/17/2023  Key: [id number]

## 2022-09-18 NOTE — Telephone Encounter (Signed)
Received fax from patient insurance [employer] Trulance 3 mg was approved 09/18/22 to 09/18/2023. Prior Authorization#[id number]

## 2022-10-09 NOTE — Progress Notes (Unsigned)
Follow Up Note  RE: Denise [name] MRN: [id number] DOB: Sep 17, 1975 Date of Office Visit: 10/10/2022  Referring provider: Pincus Sanes, MD Primary care provider: Pincus Sanes, MD  Chief Complaint: No chief complaint on file.  History of Present Illness: I had the pleasure of seeing Denise [name] for a follow up visit at the [facility] on 10/09/2022. She is a 47 y.o. female, who is being followed for CIU, allergic rhinitis, headaches. Her previous allergy office visit was on 08/09/2021 with Dr. Selena Batten. Today is a regular follow up visit.  Chronic urticaria Past history - Pruritic hives/welts on and off for the past 10 months. Seemed to be worse since she had miscarriage and seem to occur more frequently during her menses. Positive dermatographism on exam. Bloodwork unremarkable - TSH, CMP, CBC with Diff, ANA w/Reflex Interim history - no hives but had some itching at times and takes zyrtec prn with good benefit.  May take zyrtec 10mg  1-2 times a day as needed.  Avoid the following potential triggers: alcohol, tight clothing, NSAIDs.  Keep track of episodes and take pictures.    Perennial allergic rhinitis Past history - Rhinitis symptoms for the past 5 years mainly during the spring and summer.  Using Singulair and Allegra as needed with good benefit. 2019 skin testing was positive to dust mites and cats.  Interim history - itchy eyes.  Continue environmental control measures.  Use over the counter antihistamines such as Zyrtec (cetirizine), Claritin (loratadine), Allegra (fexofenadine), or Xyzal (levocetirizine) daily as needed. May take twice a day during allergy flares. May switch antihistamines every few months. Use Flonase (fluticasone) nasal spray 1 spray per nostril twice a day as needed for nasal congestion.  Use olopatadine eye drops 0.2% once a day as needed for itchy/watery eyes. Do not put over the contact lens and wait about 10-15 minutes before putting  contact lens in.    Generalized headache If no improvement follow up with PCP.   Assessment and Plan: [name] is a 47 y.o. female with: No problem-specific Assessment & Plan notes found for this encounter.  No follow-ups on file.  No orders of the defined types were placed in this encounter.  Lab Orders  No laboratory test(s) ordered today    Diagnostics: Spirometry:  Tracings reviewed. Her effort: {Blank single:19197::"Good reproducible efforts.","It was hard to get consistent efforts and there is a question as to whether this reflects a maximal maneuver.","Poor effort, data can not be interpreted."} FVC: ***L FEV1: ***L, ***% predicted FEV1/FVC ratio: ***% Interpretation: {Blank single:19197::"Spirometry consistent with mild obstructive disease","Spirometry consistent with moderate obstructive disease","Spirometry consistent with severe obstructive disease","Spirometry consistent with possible restrictive disease","Spirometry consistent with mixed obstructive and restrictive disease","Spirometry uninterpretable due to technique","Spirometry consistent with normal pattern","No overt abnormalities noted given today's efforts"}.  Please see scanned spirometry results for details.  Skin Testing: {Blank single:19197::"Select foods","Environmental allergy panel","Environmental allergy panel and select foods","Food allergy panel","None","Deferred due to recent antihistamines use"}. *** Results discussed with patient/family.   Medication List:  Current Outpatient Medications  Medication Sig Dispense Refill  . cetirizine (ZYRTEC ALLERGY) 10 MG tablet Take 1 tablet (10 mg total) by mouth 2 (two) times daily as needed (hives). 60 tablet 5  . famotidine (PEPCID) 20 MG tablet Take 1 tablet (20 mg total) by mouth 2 (two) times daily as needed for heartburn or indigestion. 60 tablet 2  . fluticasone (FLONASE) 50 MCG/ACT nasal spray Place 1 spray into both nostrils 2 (two) times daily  as needed  for allergies or rhinitis. 16 g 5  . hydrocortisone 2.5 % cream Apply topically 2 (two) times daily. 30 g 0  . hydrocortisone-pramoxine (PROCTOFOAM-HC) rectal foam Place 1 applicator rectally 2 (two) times daily. 10 g 2  . indapamide (LOZOL) 1.25 MG tablet Take 1 tablet (1.25 mg total) by mouth daily. 90 tablet 2  . lisdexamfetamine (VYVANSE) 30 MG capsule Take 1 capsule (30 mg total) by mouth daily. 30 capsule 0  . meloxicam (MOBIC) 15 MG tablet Take 15 mg by mouth daily as needed.    . Na Sulfate-K Sulfate-Mg Sulf 17.5-3.13-1.6 GM/177ML SOLN Use as directed; may use generic; goodrx card if insurance will not cover generic 354 mL 0  . NIFEdipine (PROCARDIA XL/NIFEDICAL XL) 60 MG 24 hr tablet TAKE ONE TABLET BY MOUTH DAILY 90 tablet 2  . Olopatadine HCl 0.2 % SOLN Apply 1 drop to eye daily as needed (itchy/watery eyes). 2.5 mL 5  . ondansetron (ZOFRAN) 4 MG tablet Take 1 tablet (4 mg total) by mouth every 8 (eight) hours as needed for nausea or vomiting. 20 tablet 0  . potassium chloride SA (KLOR-CON M) 20 MEQ tablet Take 1 tablet (20 mEq total) by mouth daily. 90 tablet 3  . Prenatal Vit-Fe Fumarate-FA (PRENATAL MULTIVITAMIN) TABS tablet Take 1 tablet by mouth daily at 12 noon.    Marland Kitchen tiZANidine (ZANAFLEX) 4 MG tablet Take 4 mg by mouth at bedtime.    . TRULANCE 3 MG TABS Take 1 tablet (3 mg total) by mouth daily. 30 tablet 2  . valACYclovir (VALTREX) 1000 MG tablet TAKE ONE TABLET BY MOUTH DAILY 90 tablet 4   No current facility-administered medications for this visit.   Allergies: Allergies  Allergen Reactions  . Ace Inhibitors     Swelling og tongue and lips  . Adhesive [Tape] Rash  . Latex Itching and Rash    Rash and itching from use of gloves   I reviewed her past medical history, social history, family history, and environmental history and no significant changes have been reported from her previous visit.  Review of Systems  Constitutional:  Negative for appetite change, chills,  fever and unexpected weight change.  HENT:  Positive for sneezing. Negative for congestion, rhinorrhea and sinus pressure.   Eyes:  Positive for itching.  Respiratory:  Negative for cough, chest tightness, shortness of breath and wheezing.   Cardiovascular:  Negative for chest pain.  Gastrointestinal:  Negative for abdominal pain.  Genitourinary:  Negative for difficulty urinating.  Skin:  Negative for rash.  Allergic/Immunologic: Positive for environmental allergies. Negative for food allergies.  Neurological:  Positive for headaches.   Objective: There were no vitals taken for this visit. There is no height or weight on file to calculate BMI. Physical Exam Vitals and nursing note reviewed.  Constitutional:      Appearance: Normal appearance. She is well-developed.  HENT:     Head: Normocephalic and atraumatic.     Right Ear: Tympanic membrane and external ear normal.     Left Ear: External ear normal. There is impacted cerumen.     Nose: Nose normal.  Eyes:     Conjunctiva/sclera: Conjunctivae normal.  Cardiovascular:     Rate and Rhythm: Normal rate and regular rhythm.     Heart sounds: Normal heart sounds. No murmur heard.    No friction rub. No gallop.  Pulmonary:     Effort: Pulmonary effort is normal.     Breath sounds: Normal breath  sounds. No wheezing or rales.  Musculoskeletal:     Cervical back: Neck supple.  Skin:    General: Skin is warm.     Findings: No rash.  Neurological:     Mental Status: She is alert and oriented to person, place, and time.  Psychiatric:        Behavior: Behavior normal.  Previous notes and tests were reviewed. The plan was reviewed with the patient/family, and all questions/concerned were addressed.  It was my pleasure to see Alyah today and participate in her care. Please feel free to contact me with any questions or concerns.  Sincerely,  Wyline Mood, DO Allergy & Immunology  [facility] office: [phone] [facility] [place] office: [phone]

## 2022-10-10 ENCOUNTER — Encounter: Payer: Self-pay | Admitting: Allergy

## 2022-10-10 ENCOUNTER — Encounter: Payer: Self-pay | Admitting: Internal Medicine

## 2022-10-10 ENCOUNTER — Other Ambulatory Visit: Payer: Self-pay

## 2022-10-10 ENCOUNTER — Ambulatory Visit: Payer: Medicaid Other | Admitting: Allergy

## 2022-10-10 ENCOUNTER — Other Ambulatory Visit: Payer: Self-pay | Admitting: Internal Medicine

## 2022-10-10 VITALS — BP 112/66 | HR 79 | Temp 98.6°F | Resp 20 | Wt 175.4 lb

## 2022-10-10 DIAGNOSIS — H1013 Acute atopic conjunctivitis, bilateral: Secondary | ICD-10-CM

## 2022-10-10 DIAGNOSIS — R519 Headache, unspecified: Secondary | ICD-10-CM

## 2022-10-10 DIAGNOSIS — J3089 Other allergic rhinitis: Secondary | ICD-10-CM

## 2022-10-10 DIAGNOSIS — L508 Other urticaria: Secondary | ICD-10-CM | POA: Diagnosis not present

## 2022-10-10 MED ORDER — LISDEXAMFETAMINE DIMESYLATE 30 MG PO CAPS: 30.0000 mg | ORAL_CAPSULE | Freq: Every day | ORAL | 0 refills | Status: DC

## 2022-10-10 MED ORDER — FLUTICASONE PROPIONATE 50 MCG/ACT NA SUSP: 1.0000 | Freq: Two times a day (BID) | NASAL | 5 refills | Status: DC | PRN

## 2022-10-10 MED ORDER — CROMOLYN SODIUM 4 % OP SOLN: 1.0000 [drp] | Freq: Four times a day (QID) | OPHTHALMIC | 5 refills | Status: DC | PRN

## 2022-10-10 MED ORDER — CETIRIZINE HCL 10 MG PO TABS: ORAL_TABLET | ORAL | 5 refills | Status: DC

## 2022-10-10 MED ORDER — AZELASTINE HCL 0.1 % NA SOLN: 1.0000 | Freq: Two times a day (BID) | NASAL | 5 refills | Status: DC | PRN

## 2022-10-10 NOTE — Assessment & Plan Note (Signed)
Past history - Rhinitis symptoms for the past 5 years mainly during the spring and summer.  Using Singulair and Allegra as needed with good benefit. 2019 skin testing was positive to dust mites and cats.  Interim history - itchy eyes, sneezing and PND this spring.  Continue environmental control measures.  Use over the counter antihistamines such as Zyrtec (cetirizine), Claritin (loratadine), Allegra (fexofenadine), or Xyzal (levocetirizine) daily as needed. May take twice a day during allergy flares. May switch antihistamines every few months. Use Flonase (fluticasone) nasal spray 1 spray per nostril twice a day as needed for nasal congestion.  Use azelastine nasal spray 1-2 sprays per nostril twice a day as needed for runny nose/drainage. Use olopatadine eye drops 0.2% once a day as needed for itchy/watery eyes OR Use cromolyn 4% 1 drop in each eye up to four times a day as needed for itchy/watery eyes.  Do not put over the contact lens and wait about 10-15 minutes before putting contact lens in.  f no improvement, consider re-testing. Must be off antihistamines for 3 days before. If you can't come off your allergy medications then we can order bloodwork. Consider allergy injections for long term control if above medications do not help the symptoms - handout given.

## 2022-10-10 NOTE — Assessment & Plan Note (Signed)
.   See assessment and plan as above. 

## 2022-10-10 NOTE — Patient Instructions (Signed)
Perennial allergic rhinitis If no improvement, consider re-testing. Must be off antihistamines for 3 days before. If you can't come off your allergy medications then we can order bloodwork.  2019 skin testing was positive to dust mites and cats.  Continue environmental control measures.  Use over the counter antihistamines such as Zyrtec (cetirizine), Claritin (loratadine), Allegra (fexofenadine), or Xyzal (levocetirizine) daily as needed. May take twice a day during allergy flares. May switch antihistamines every few months. Use Flonase (fluticasone) nasal spray 1 spray per nostril twice a day as needed for nasal congestion.  Use azelastine nasal spray 1-2 sprays per nostril twice a day as needed for runny nose/drainage.  Use olopatadine eye drops 0.2% once a day as needed for itchy/watery eyes OR Use cromolyn 4% 1 drop in each eye up to four times a day as needed for itchy/watery eyes.  Do not put over the contact lens and wait about 10-15 minutes before putting contact lens in.   Consider allergy injections for long term control if above medications do not help the symptoms - handout given.   Chronic urticaria May take zyrtec 10mg  1-2 times a day as needed.  Avoid the following potential triggers: alcohol, tight clothing, NSAIDs.  Keep track of episodes and take pictures.   Follow up in 1 year or sooner if needed.   Control of House Dust Mite Allergen Dust mite allergens are a common trigger of allergy and asthma symptoms. While they can be found throughout the house, these microscopic creatures thrive in warm, humid environments such as bedding, upholstered furniture and carpeting. Because so much time is spent in the bedroom, it is essential to reduce mite levels there.  Encase pillows, mattresses, and box springs in special allergen-proof fabric covers or airtight, zippered plastic covers.  Bedding should be washed weekly in hot water (130 F) and dried in a hot dryer. Allergen-proof  covers are available for comforters and pillows that can't be regularly washed.  Wash the allergy-proof covers every few months. Minimize clutter in the bedroom. Keep pets out of the bedroom.  Keep humidity less than 50% by using a dehumidifier or air conditioning. You can buy a humidity measuring device called a hygrometer to monitor this.  If possible, replace carpets with hardwood, linoleum, or washable area rugs. If that's not possible, vacuum frequently with a vacuum that has a HEPA filter. Remove all upholstered furniture and non-washable window drapes from the bedroom. Remove all non-washable stuffed toys from the bedroom.  Wash stuffed toys weekly. Reducing Pollen Exposure Pollen seasons: trees (spring), grass (summer) and ragweed/weeds (fall). Keep windows closed in your home and car to lower pollen exposure.  Install air conditioning in the bedroom and throughout the house if possible.  Avoid going out in dry windy days - especially early morning. Pollen counts are highest between 5 - 10 AM and on dry, hot and windy days.  Save outside activities for late afternoon or after a heavy rain, when pollen levels are lower.  Avoid mowing of grass if you have grass pollen allergy. Be aware that pollen can also be transported indoors on people and pets.  Dry your clothes in an automatic dryer rather than hanging them outside where they might collect pollen.  Rinse hair and eyes before bedtime. Pet Allergen Avoidance: Contrary to popular opinion, there are no "hypoallergenic" breeds of dogs or cats. That is because people are not allergic to an animal's hair, but to an allergen found in the animal's saliva, dander (dead  skin flakes) or urine. Pet allergy symptoms typically occur within minutes. For some people, symptoms can build up and become most severe 8 to 12 hours after contact with the animal. People with severe allergies can experience reactions in public places if dander has been  transported on the pet owners' clothing. Keeping an animal outdoors is only a partial solution, since homes with pets in the yard still have higher concentrations of animal allergens. Before getting a pet, ask your allergist to determine if you are allergic to animals. If your pet is already considered part of your family, try to minimize contact and keep the pet out of the bedroom and other rooms where you spend a great deal of time. As with dust mites, vacuum carpets often or replace carpet with a hardwood floor, tile or linoleum. High-efficiency particulate air (HEPA) cleaners can reduce allergen levels over time. While dander and saliva are the source of cat and dog allergens, urine is the source of allergens from rabbits, hamsters, mice and [place] pigs; so ask a non-allergic family member to clean the animal's cage. If you have a pet allergy, talk to your allergist about the potential for allergy immunotherapy (allergy shots). This strategy can often provide long-term relief.

## 2022-10-10 NOTE — Assessment & Plan Note (Signed)
Past history - Pruritic hives/welts on and off for the past 10 months. Seemed to be worse since she had miscarriage and seem to occur more frequently during her menses. Positive dermatographism on exam. Bloodwork unremarkable - TSH, CMP, CBC with Diff, ANA w/Reflex Interim history - no hives but itching at times and using zyrtec prn with good benefit.  May take zyrtec 10mg  1-2 times a day as needed.  Avoid the following potential triggers: alcohol, tight clothing, NSAIDs.  Keep track of episodes and take pictures.

## 2022-10-23 ENCOUNTER — Encounter: Payer: Medicaid Other | Admitting: Internal Medicine

## 2022-10-29 ENCOUNTER — Encounter: Payer: Self-pay | Admitting: Internal Medicine

## 2022-10-29 ENCOUNTER — Ambulatory Visit (AMBULATORY_SURGERY_CENTER): Payer: Medicaid Other | Admitting: Internal Medicine

## 2022-10-29 VITALS — BP 125/69 | HR 68 | Temp 99.1°F | Resp 11 | Ht 64.0 in | Wt 175.0 lb

## 2022-10-29 DIAGNOSIS — I10 Essential (primary) hypertension: Secondary | ICD-10-CM | POA: Diagnosis not present

## 2022-10-29 DIAGNOSIS — K2950 Unspecified chronic gastritis without bleeding: Secondary | ICD-10-CM | POA: Diagnosis not present

## 2022-10-29 DIAGNOSIS — K219 Gastro-esophageal reflux disease without esophagitis: Secondary | ICD-10-CM | POA: Diagnosis not present

## 2022-10-29 DIAGNOSIS — K2970 Gastritis, unspecified, without bleeding: Secondary | ICD-10-CM | POA: Diagnosis not present

## 2022-10-29 DIAGNOSIS — K6289 Other specified diseases of anus and rectum: Secondary | ICD-10-CM

## 2022-10-29 DIAGNOSIS — K259 Gastric ulcer, unspecified as acute or chronic, without hemorrhage or perforation: Secondary | ICD-10-CM

## 2022-10-29 MED ORDER — SODIUM CHLORIDE 0.9 % IV SOLN: 500.0000 mL | Freq: Once | INTRAVENOUS | Status: DC

## 2022-10-29 MED ORDER — OMEPRAZOLE 40 MG PO CPDR
40.0000 mg | DELAYED_RELEASE_CAPSULE | Freq: Every day | ORAL | 1 refills | Status: DC
Start: 2022-10-29 — End: 2023-03-29

## 2022-10-29 NOTE — Progress Notes (Signed)
GASTROENTEROLOGY PROCEDURE H&P NOTE   Primary Care Physician: Pincus Sanes, MD    Reason for Procedure:   Dysphagia, GERD, rectal pain, colon cancer screening  Plan:    EGD/colonoscopy  Patient is appropriate for endoscopic procedure(s) in the ambulatory ([facility]) setting.  The nature of the procedure, as well as the risks, benefits, and alternatives were carefully and thoroughly reviewed with the patient. Ample time for discussion and questions allowed. The patient understood, was satisfied, and agreed to proceed.     HPI: Denise [name] is a 47 y.o. female who presents for EGD/colonoscopy for evaluation of dysphagia, GERD, rectal pain, and colon cancer screening.  Patient was most recently seen in the Gastroenterology Clinic on 09/18/22.  No interval change in medical history since that appointment. Please refer to that note for full details regarding GI history and clinical presentation.   Past Medical History:  Diagnosis Date   Anemia    during pregnancy    Chronic headaches    Colon polyps    Complication of anesthesia    crying excessive spitting   Concussion age 58 or 65 no residual   Family history of adverse reaction to anesthesia    ponv   GERD (gastroesophageal reflux disease)    GERD (gastroesophageal reflux disease)    HSV-2 infection    2008   Hypertension    IBS (irritable bowel syndrome)    IC (interstitial cystitis)    2008   Migraine    Wears glasses     Past Surgical History:  Procedure Laterality Date   ADENOIDECTOMY  as child   BREAST SURGERY  2002   right breast biopsy Benign   CESAREAN SECTION N/A 08/30/2013   Procedure: CESAREAN SECTION;  Surgeon: Serita Kyle, MD;  Location: [facility];  Service: Obstetrics;  Laterality: N/A;   colonscopy  04/18/2020   and endoscopy done   DILATION AND EVACUATION N/A 06/23/2020   Procedure: DILATATION AND EVACUATION;  Surgeon: Jerene Bears, MD;  Location: [facility];  Service:  Gynecology;  Laterality: N/A;   KNEE ARTHROSCOPY  3/11, 2012   right knee   SHOULDER ARTHROSCOPY Right 2010   TONSILLECTOMY  as child   and adenoids   WISDOM TOOTH EXTRACTION  2008    Prior to Admission medications   Medication Sig Start Date End Date Taking? Authorizing Provider  azelastine (ASTELIN) 0.1 % nasal spray Place 1-2 sprays into both nostrils 2 (two) times daily as needed (nasal drainage). Use in each nostril as directed 10/10/22   Ellamae Sia, DO  cetirizine (ZYRTEC ALLERGY) 10 MG tablet May take 10mg  1-2 timer per day for hives and allergies. 10/10/22   Ellamae Sia, DO  cromolyn (OPTICROM) 4 % ophthalmic solution Place 1 drop into both eyes 4 (four) times daily as needed (itchy/watery eyes). 10/10/22   Ellamae Sia, DO  famotidine (PEPCID) 20 MG tablet Take 1 tablet (20 mg total) by mouth 2 (two) times daily as needed for heartburn or indigestion. 09/18/22 12/17/22  Imogene Burn, MD  fluticasone (FLONASE) 50 MCG/ACT nasal spray Place 1 spray into both nostrils 2 (two) times daily as needed (nasal congestion). 10/10/22   Ellamae Sia, DO  hydrocortisone 2.5 % cream Apply topically 2 (two) times daily. 09/04/22   Pincus Sanes, MD  hydrocortisone-pramoxine [facility]) rectal foam Place 1 applicator rectally 2 (two) times daily. 09/04/22   Pincus Sanes, MD  indapamide (LOZOL) 1.25 MG tablet Take 1  tablet (1.25 mg total) by mouth daily. 08/06/22   Pincus Sanes, MD  lisdexamfetamine (VYVANSE) 30 MG capsule Take 1 capsule (30 mg total) by mouth daily. 10/10/22   Pincus Sanes, MD  meloxicam (MOBIC) 15 MG tablet Take 15 mg by mouth daily as needed. 01/07/22   [provider]  Na Sulfate-K Sulfate-Mg Sulf 17.5-3.13-1.6 GM/177ML SOLN Use as directed; may use generic; goodrx card if insurance will not cover generic 09/18/22   Imogene Burn, MD  NIFEdipine (PROCARDIA XL/NIFEDICAL XL) 60 MG 24 hr tablet TAKE ONE TABLET BY MOUTH DAILY 05/01/22   Pincus Sanes, MD  Olopatadine HCl 0.2 %  SOLN Apply 1 drop to eye daily as needed (itchy/watery eyes). 09/04/22   Pincus Sanes, MD  ondansetron (ZOFRAN) 4 MG tablet Take 1 tablet (4 mg total) by mouth every 8 (eight) hours as needed for nausea or vomiting. 03/16/22   Barrie Dunker B, PA-C  potassium chloride SA (KLOR-CON M) 20 MEQ tablet Take 1 tablet (20 mEq total) by mouth daily. 09/04/22   Pincus Sanes, MD  Prenatal Vit-Fe Fumarate-FA (PRENATAL MULTIVITAMIN) TABS tablet Take 1 tablet by mouth daily at 12 noon.    [provider]  tiZANidine (ZANAFLEX) 4 MG tablet Take 4 mg by mouth at bedtime. 07/05/21   [provider]  TRULANCE 3 MG TABS Take 1 tablet (3 mg total) by mouth daily. 09/18/22   Imogene Burn, MD  valACYclovir (VALTREX) 1000 MG tablet TAKE ONE TABLET BY MOUTH DAILY 03/07/22   Jerene Bears, MD    Current Outpatient Medications  Medication Sig Dispense Refill   azelastine (ASTELIN) 0.1 % nasal spray Place 1-2 sprays into both nostrils 2 (two) times daily as needed (nasal drainage). Use in each nostril as directed 30 mL 5   cetirizine (ZYRTEC ALLERGY) 10 MG tablet May take 10mg  1-2 timer per day for hives and allergies. 60 tablet 5   cromolyn (OPTICROM) 4 % ophthalmic solution Place 1 drop into both eyes 4 (four) times daily as needed (itchy/watery eyes). 10 mL 5   famotidine (PEPCID) 20 MG tablet Take 1 tablet (20 mg total) by mouth 2 (two) times daily as needed for heartburn or indigestion. 60 tablet 2   fluticasone (FLONASE) 50 MCG/ACT nasal spray Place 1 spray into both nostrils 2 (two) times daily as needed (nasal congestion). 16 g 5   hydrocortisone 2.5 % cream Apply topically 2 (two) times daily. 30 g 0   hydrocortisone-pramoxine (PROCTOFOAM-HC) rectal foam Place 1 applicator rectally 2 (two) times daily. 10 g 2   indapamide (LOZOL) 1.25 MG tablet Take 1 tablet (1.25 mg total) by mouth daily. 90 tablet 2   lisdexamfetamine (VYVANSE) 30 MG capsule Take 1 capsule (30 mg total) by mouth daily. 30  capsule 0   meloxicam (MOBIC) 15 MG tablet Take 15 mg by mouth daily as needed.     Na Sulfate-K Sulfate-Mg Sulf 17.5-3.13-1.6 GM/177ML SOLN Use as directed; may use generic; goodrx card if insurance will not cover generic 354 mL 0   NIFEdipine (PROCARDIA XL/NIFEDICAL XL) 60 MG 24 hr tablet TAKE ONE TABLET BY MOUTH DAILY 90 tablet 2   Olopatadine HCl 0.2 % SOLN Apply 1 drop to eye daily as needed (itchy/watery eyes). 2.5 mL 5   ondansetron (ZOFRAN) 4 MG tablet Take 1 tablet (4 mg total) by mouth every 8 (eight) hours as needed for nausea or vomiting. 20 tablet 0   potassium chloride SA (KLOR-CON  M) 20 MEQ tablet Take 1 tablet (20 mEq total) by mouth daily. 90 tablet 3   Prenatal Vit-Fe Fumarate-FA (PRENATAL MULTIVITAMIN) TABS tablet Take 1 tablet by mouth daily at 12 noon.     tiZANidine (ZANAFLEX) 4 MG tablet Take 4 mg by mouth at bedtime.     TRULANCE 3 MG TABS Take 1 tablet (3 mg total) by mouth daily. 30 tablet 2   valACYclovir (VALTREX) 1000 MG tablet TAKE ONE TABLET BY MOUTH DAILY 90 tablet 4   Current Facility-Administered Medications  Medication Dose Route Frequency Provider Last Rate Last Admin   0.9 %  sodium chloride infusion  500 mL Intravenous Once Imogene Burn, MD        Allergies as of 10/29/2022 - Review Complete 10/29/2022  Allergen Reaction Noted   Ace inhibitors  02/23/2016   Adhesive [tape] Rash 06/26/2013   Latex Itching and Rash 12/16/2012    Family History  Problem Relation Age of Onset   Diabetes Mother    Hyperlipidemia Mother    Anemia Mother    Irritable bowel syndrome Mother    Hyperlipidemia Father    Hypertension Father    Heart disease Father    Colon polyps Father    Kidney disease Father    Stroke Sister    Hypertension Sister    Kidney disease Sister    Hypertension Sister    Thyroid disease Sister    Heart disease Brother    Diabetes Maternal Grandmother    Heart failure Maternal Grandmother    Prostate cancer Maternal Grandfather     Diabetes Paternal Grandmother    Stroke Paternal Grandmother    Renal Disease Paternal Grandfather    Heart disease Paternal Grandfather    Stroke Paternal Grandfather    Anemia Maternal Aunt    Prostate cancer Paternal Uncle    Kidney disease Maternal Uncle    Colon cancer Neg Hx    Stomach cancer Neg Hx    Esophageal cancer Neg Hx     Social History   Socioeconomic History   Marital status: Married    Spouse name: Not on file   Number of children: 1   Years of education: Not on file   Highest education level: Bachelor's degree (e.g., BA, AB, BS)  Occupational History   Occupation: home maker  Tobacco Use   Smoking status: Never   Smokeless tobacco: Never  Vaping Use   Vaping Use: Never used  Substance and Sexual Activity   Alcohol use: Not Currently   Drug use: Yes    Types: Marijuana    Comment: marijuana last use sept 2021   Sexual activity: Yes    Partners: Male    Birth control/protection: None  Other Topics Concern   Not on file  Social History Narrative   Not on file   Social Determinants of Health   Financial Resource Strain: Patient Declined (09/03/2022)   Overall Financial Resource Strain (CARDIA)    Difficulty of Paying Living Expenses: Patient declined  Food Insecurity: Patient Declined (09/03/2022)   Hunger Vital Sign    Worried About Running Out of Food in the Last Year: Patient declined    Ran Out of Food in the Last Year: Patient declined  Transportation Needs: No Transportation Needs (09/03/2022)   PRAPARE - Administrator, Civil Service (Medical): No    Lack of Transportation (Non-Medical): No  Physical Activity: Insufficiently Active (09/03/2022)   Exercise Vital Sign    Days of Exercise  per Week: 3 days    Minutes of Exercise per Session: 30 min  Stress: No Stress Concern Present (09/03/2022)   [employer] of Occupational Health - Occupational Stress Questionnaire    Feeling of Stress : Only a little  Social Connections: Unknown  (09/03/2022)   Social Connection and Isolation Panel [NHANES]    Frequency of Communication with Friends and Family: More than three times a week    Frequency of Social Gatherings with Friends and Family: Once a week    Attends Religious Services: Patient declined    Database administrator or Organizations: No    Attends Engineer, structural: Not on file    Marital Status: Patient declined  Catering manager Violence: Not on file    Physical Exam: Vital signs in last 24 hours: BP 131/77   Pulse 83   Temp 99.1 F (37.3 C)   Ht 5\' 4"  (1.626 m)   Wt 175 lb (79.4 kg)   LMP 10/08/2022 (Approximate) Comment: not sure if last menstrual period was 5/13/or 5/20- PREGNANCY TEST DONE -NEGATIVE  SpO2 99%   BMI 30.04 kg/m  GEN: NAD EYE: Sclerae anicteric ENT: MMM CV: Non-tachycardic Pulm: No increased WOB GI: Soft NEURO:  Alert & Oriented   Eulah Pont, MD [facility]   10/29/2022 1:38 PM

## 2022-10-29 NOTE — Progress Notes (Signed)
Uneventful anesthetic. Report to pacu rn. Vss. Care resumed by rn. 

## 2022-10-29 NOTE — Progress Notes (Signed)
Called to room to assist during endoscopic procedure.  Patient ID and intended procedure confirmed with present staff. Received instructions for my participation in the procedure from the performing physician.  

## 2022-10-29 NOTE — Patient Instructions (Addendum)
Handout on gastritis and hemorrhoids given to patient Resume previous diet and continue present medications - pick up prescription for Prilosec (omeprazole) 40 mg daily for 8 weeks from pharmacy  Repeat colonoscopy in 10 years for surveillance Follow-up in office in 6 weeks with Dr. Leonides Schanz - her office will get this set up for you!   YOU HAD AN ENDOSCOPIC PROCEDURE TODAY AT THE [facility]:   Refer to the procedure report that was given to you for any specific questions about what was found during the examination.  If the procedure report does not answer your questions, please call your gastroenterologist to clarify.  If you requested that your care partner not be given the details of your procedure findings, then the procedure report has been included in a sealed envelope for you to review at your convenience later.  YOU SHOULD EXPECT: Some feelings of bloating in the abdomen. Passage of more gas than usual.  Walking can help get rid of the air that was put into your GI tract during the procedure and reduce the bloating. If you had a lower endoscopy (such as a colonoscopy or flexible sigmoidoscopy) you may notice spotting of blood in your stool or on the toilet paper. If you underwent a bowel prep for your procedure, you may not have a normal bowel movement for a few days.  Please Note:  You might notice some irritation and congestion in your nose or some drainage.  This is from the oxygen used during your procedure.  There is no need for concern and it should clear up in a day or so.  SYMPTOMS TO REPORT IMMEDIATELY:  Following lower endoscopy (colonoscopy or flexible sigmoidoscopy):  Excessive amounts of blood in the stool  Significant tenderness or worsening of abdominal pains  Swelling of the abdomen that is new, acute  Fever of 100F or higher  Following upper endoscopy (EGD)  Vomiting of blood or coffee ground material  New chest pain or pain under the shoulder  blades  Painful or persistently difficult swallowing  New shortness of breath  Fever of 100F or higher  Black, tarry-looking stools  For urgent or emergent issues, a gastroenterologist can be reached at any hour by calling [phone]. Do not use MyChart messaging for urgent concerns.    DIET:  We do recommend a small meal at first, but then you may proceed to your regular diet.  Drink plenty of fluids but you should avoid alcoholic beverages for 24 hours.  ACTIVITY:  You should plan to take it easy for the rest of today and you should NOT DRIVE or use heavy machinery until tomorrow (because of the sedation medicines used during the test).    FOLLOW UP: Our staff will call the number listed on your records the next business day following your procedure.  We will call around 7:15- 8:00 am to check on you and address any questions or concerns that you may have regarding the information given to you following your procedure. If we do not reach you, we will leave a message.     If any biopsies were taken you will be contacted by phone or by letter within the next 1-3 weeks.  Please call us at [phone] if you have not heard about the biopsies in 3 weeks.    SIGNATURES/CONFIDENTIALITY: You and/or your care partner have signed paperwork which will be entered into your electronic medical record.  These signatures attest to the fact that that the information  above on your After Visit Summary has been reviewed and is understood.  Full responsibility of the confidentiality of this discharge information lies with you and/or your care-partner.

## 2022-10-29 NOTE — Op Note (Signed)
[facility] Patient Name: Denise [name] Procedure Date: 10/29/2022 1:36 PM MRN: [id number] Endoscopist: Madelyn Brunner Ruth , , [id number] Age: 47 Referring MD:  Date of Birth: 1975-12-22 Gender: Female Account #: [id number] Procedure:                Colonoscopy Indications:              Rectal pain Medicines:                Monitored Anesthesia Care Procedure:                Pre-Anesthesia Assessment:                           - Prior to the procedure, a History and Physical                            was performed, and patient medications and                            allergies were reviewed. The patient's tolerance of                            previous anesthesia was also reviewed. The risks                            and benefits of the procedure and the sedation                            options and risks were discussed with the patient.                            All questions were answered, and informed consent                            was obtained. Prior Anticoagulants: The patient has                            taken no anticoagulant or antiplatelet agents. ASA                            Grade Assessment: II - A patient with mild systemic                            disease. After reviewing the risks and benefits,                            the patient was deemed in satisfactory condition to                            undergo the procedure.                           After obtaining informed consent, the colonoscope  was passed under direct vision. Throughout the                            procedure, the patient's blood pressure, pulse, and                            oxygen saturations were monitored continuously. The                            Olympus [id number]) Colonoscope was                            introduced through the anus and advanced to the the                            cecum, identified by appendiceal orifice and                             ileocecal valve. The colonoscopy was performed                            without difficulty. The patient tolerated the                            procedure well. The quality of the bowel                            preparation was excellent. The ileocecal valve,                            appendiceal orifice, and rectum were photographed. Scope In: 2:11:07 PM Scope Out: 2:21:08 PM Scope Withdrawal Time: 0 hours 6 minutes 16 seconds  Total Procedure Duration: 0 hours 10 minutes 1 second  Findings:                 Non-bleeding internal hemorrhoids were found during                            retroflexion.                           The exam was otherwise without abnormality. Complications:            No immediate complications. Estimated Blood Loss:     Estimated blood loss: none. Impression:               - Non-bleeding internal hemorrhoids.                           - The examination was otherwise normal.                           - No specimens collected. Recommendation:           - Discharge patient to home (with escort).                           -  Repeat colonoscopy in 10 years for screening                            purposes.                           - The findings and recommendations were discussed                            with the patient. Dr Particia Lather "[address]" [place],  10/29/2022 2:36:27 PM

## 2022-10-29 NOTE — Progress Notes (Signed)
Pt's states no medical or surgical changes since previsit or office visit. 

## 2022-10-29 NOTE — Op Note (Signed)
[facility] Patient Name: Denise [name] Procedure Date: 10/29/2022 1:46 PM MRN: [id number] Endoscopist: Madelyn Brunner [place] , , [id number] Age: 47 Referring MD:  Date of Birth: [birth year] Gender: Female Account #: [id number] Procedure:                Upper GI endoscopy Indications:              Dysphagia, Heartburn Medicines:                Monitored Anesthesia Care Procedure:                Pre-Anesthesia Assessment:                           - Prior to the procedure, a History and Physical                            was performed, and patient medications and                            allergies were reviewed. The patient's tolerance of                            previous anesthesia was also reviewed. The risks                            and benefits of the procedure and the sedation                            options and risks were discussed with the patient.                            All questions were answered, and informed consent                            was obtained. Prior Anticoagulants: The patient has                            taken no anticoagulant or antiplatelet agents. ASA                            Grade Assessment: II - A patient with mild systemic                            disease. After reviewing the risks and benefits,                            the patient was deemed in satisfactory condition to                            undergo the procedure.                           After obtaining informed consent, the endoscope was  passed under direct vision. Throughout the                            procedure, the patient's blood pressure, pulse, and                            oxygen saturations were monitored continuously. The                            GIF [id number] #[id number] was introduced through the                            mouth, and advanced to the second part of duodenum.                            The upper GI endoscopy was  accomplished without                            difficulty. The patient tolerated the procedure                            well. Scope In: Scope Out: Findings:                 The examined esophagus was normal. Biopsies were                            taken with a cold forceps for histology.                           Localized mild inflammation characterized by                            congestion (edema), erosions and erythema was found                            in the gastric antrum. Biopsies were taken with a                            cold forceps for histology.                           The examined duodenum was normal. Complications:            No immediate complications. Estimated Blood Loss:     Estimated blood loss was minimal. Impression:               - Normal esophagus. Biopsied.                           - Gastritis. Biopsied.                           - Normal examined duodenum. Recommendation:           - Await pathology results.                           -  Perform a colonoscopy today.                           - Use Prilosec (omeprazole) 40 mg PO daily for 8                            weeks.                           - Return to GI clinic in 6 weeks. Dr Particia Lather "Alan Ripper" Leonides Schanz,  10/29/2022 2:33:05 PM

## 2022-10-30 ENCOUNTER — Telehealth: Payer: Self-pay | Admitting: *Deleted

## 2022-10-30 ENCOUNTER — Telehealth: Payer: Self-pay | Admitting: Internal Medicine

## 2022-10-30 NOTE — Telephone Encounter (Signed)
Virtual appointment made with [facility] tomorrow.  Patient reports symptoms of sinus infection and requesting to have something sent in.

## 2022-10-30 NOTE — Telephone Encounter (Signed)
  Follow up Call-     10/29/2022    1:24 PM  Call back number  Post procedure Call Back phone  # [phone]  Permission to leave phone message Yes     Patient questions:  Do you have a fever, pain , or abdominal swelling? No. Pain Score  0 *  Have you tolerated food without any problems? Yes.    Have you been able to return to your normal activities? Yes.    Do you have any questions about your discharge instructions: Diet   No. Medications  No. Follow up visit  No.  Do you have questions or concerns about your Care? No.  Actions: * If pain score is 4 or above: No action needed, pain <4.

## 2022-10-30 NOTE — Telephone Encounter (Signed)
Pt called wanting to speak with Denise [name] about a situation pt would not give me further details. Pt also stated you guys had spoke aboiut this situation before. Pleas advise.

## 2022-10-31 ENCOUNTER — Telehealth (INDEPENDENT_AMBULATORY_CARE_PROVIDER_SITE_OTHER): Payer: Medicaid Other | Admitting: Family Medicine

## 2022-10-31 ENCOUNTER — Other Ambulatory Visit: Payer: Self-pay

## 2022-10-31 DIAGNOSIS — R051 Acute cough: Secondary | ICD-10-CM | POA: Diagnosis not present

## 2022-10-31 DIAGNOSIS — J019 Acute sinusitis, unspecified: Secondary | ICD-10-CM | POA: Diagnosis not present

## 2022-10-31 MED ORDER — AMOXICILLIN-POT CLAVULANATE 875-125 MG PO TABS
1.0000 | ORAL_TABLET | Freq: Two times a day (BID) | ORAL | 0 refills | Status: DC
Start: 2022-10-31 — End: 2022-11-13

## 2022-10-31 NOTE — Progress Notes (Signed)
MyChart Video Visit    Virtual Visit via Video Note    Patient location: Home. Patient and provider in visit Provider location: Office  I discussed the limitations of evaluation and management by telemedicine and the availability of in person appointments. The patient expressed understanding and agreed to proceed.  Visit Date: 10/31/2022  Today's healthcare provider: Hetty Blend, NP-C     Subjective:    Patient ID: Denise [name], female    DOB: 08/12/1975, 47 y.o.   MRN: [id number]  Chief Complaint  Patient presents with   Sore Throat   head pressure    Nose and head pressure   Cough    Started Monday, mostly at night    HPI  C/o 2 wk hx of fatigue, ST, nasal congestion, frontal headache, pressure behind her eyes, facial pain, purulent nasal drainage, cough.   No fever, chills, chest pain, palpitations, shortness of breath, N/V/D.   No hx of lung disease. Does not smoke.   Taking Zyrtec   No chance of pregnancy.    Past Medical History:  Diagnosis Date   Anemia    during pregnancy    Chronic headaches    Colon polyps    Complication of anesthesia    crying excessive spitting   Concussion age 43 or 57 no residual   Family history of adverse reaction to anesthesia    ponv   GERD (gastroesophageal reflux disease)    GERD (gastroesophageal reflux disease)    HSV-2 infection    2008   Hypertension    IBS (irritable bowel syndrome)    IC (interstitial cystitis)    2008   Migraine    Wears glasses     Past Surgical History:  Procedure Laterality Date   ADENOIDECTOMY  as child   BREAST SURGERY  2002   right breast biopsy Benign   CESAREAN SECTION N/A 08/30/2013   Procedure: CESAREAN SECTION;  Surgeon: Serita Kyle, MD;  Location: [facility];  Service: Obstetrics;  Laterality: N/A;   colonscopy  04/18/2020   and endoscopy done   DILATION AND EVACUATION N/A 06/23/2020   Procedure: DILATATION AND EVACUATION;  Surgeon: Jerene Bears, MD;   Location: [facility];  Service: Gynecology;  Laterality: N/A;   KNEE ARTHROSCOPY  3/11, 2012   right knee   SHOULDER ARTHROSCOPY Right 2010   TONSILLECTOMY  as child   and adenoids   WISDOM TOOTH EXTRACTION  2008    Family History  Problem Relation Age of Onset   Diabetes Mother    Hyperlipidemia Mother    Anemia Mother    Irritable bowel syndrome Mother    Hyperlipidemia Father    Hypertension Father    Heart disease Father    Colon polyps Father    Kidney disease Father    Stroke Sister    Hypertension Sister    Kidney disease Sister    Hypertension Sister    Thyroid disease Sister    Heart disease Brother    Diabetes Maternal Grandmother    Heart failure Maternal Grandmother    Prostate cancer Maternal Grandfather    Diabetes Paternal Grandmother    Stroke Paternal Grandmother    Renal Disease Paternal Grandfather    Heart disease Paternal Grandfather    Stroke Paternal Grandfather    Anemia Maternal Aunt    Prostate cancer Paternal Uncle    Kidney disease Maternal Uncle    Colon cancer Neg Hx    Stomach cancer Neg  Hx    Esophageal cancer Neg Hx     Social History   Socioeconomic History   Marital status: Married    Spouse name: Not on file   Number of children: 1   Years of education: Not on file   Highest education level: Bachelor's degree (e.g., BA, AB, BS)  Occupational History   Occupation: home maker  Tobacco Use   Smoking status: Never   Smokeless tobacco: Never  Vaping Use   Vaping Use: Never used  Substance and Sexual Activity   Alcohol use: Not Currently   Drug use: Yes    Types: Marijuana    Comment: marijuana last use sept 2021   Sexual activity: Yes    Partners: Male    Birth control/protection: None  Other Topics Concern   Not on file  Social History Narrative   Not on file   Social Determinants of Health   Financial Resource Strain: Patient Declined (09/03/2022)   Overall Financial Resource Strain (CARDIA)     Difficulty of Paying Living Expenses: Patient declined  Food Insecurity: Patient Declined (09/03/2022)   Hunger Vital Sign    Worried About Running Out of Food in the Last Year: Patient declined    Ran Out of Food in the Last Year: Patient declined  Transportation Needs: No Transportation Needs (09/03/2022)   PRAPARE - Administrator, Civil Service (Medical): No    Lack of Transportation (Non-Medical): No  Physical Activity: Insufficiently Active (09/03/2022)   Exercise Vital Sign    Days of Exercise per Week: 3 days    Minutes of Exercise per Session: 30 min  Stress: No Stress Concern Present (09/03/2022)   [employer] of Occupational Health - Occupational Stress Questionnaire    Feeling of Stress : Only a little  Social Connections: Unknown (09/03/2022)   Social Connection and Isolation Panel [NHANES]    Frequency of Communication with Friends and Family: More than three times a week    Frequency of Social Gatherings with Friends and Family: Once a week    Attends Religious Services: Patient declined    Database administrator or Organizations: No    Attends Engineer, structural: Not on file    Marital Status: Patient declined  Intimate Partner Violence: Not on file    Outpatient Medications Prior to Visit  Medication Sig Dispense Refill   azelastine (ASTELIN) 0.1 % nasal spray Place 1-2 sprays into both nostrils 2 (two) times daily as needed (nasal drainage). Use in each nostril as directed 30 mL 5   cetirizine (ZYRTEC ALLERGY) 10 MG tablet May take 10mg  1-2 timer per day for hives and allergies. 60 tablet 5   cromolyn (OPTICROM) 4 % ophthalmic solution Place 1 drop into both eyes 4 (four) times daily as needed (itchy/watery eyes). 10 mL 5   famotidine (PEPCID) 20 MG tablet Take 1 tablet (20 mg total) by mouth 2 (two) times daily as needed for heartburn or indigestion. 60 tablet 2   fluticasone (FLONASE) 50 MCG/ACT nasal spray Place 1 spray into both nostrils 2  (two) times daily as needed (nasal congestion). 16 g 5   hydrocortisone 2.5 % cream Apply topically 2 (two) times daily. 30 g 0   hydrocortisone-pramoxine (PROCTOFOAM-HC) rectal foam Place 1 applicator rectally 2 (two) times daily. 10 g 2   indapamide (LOZOL) 1.25 MG tablet Take 1 tablet (1.25 mg total) by mouth daily. 90 tablet 2   lisdexamfetamine (VYVANSE) 30 MG capsule Take 1 capsule (  30 mg total) by mouth daily. 30 capsule 0   meloxicam (MOBIC) 15 MG tablet Take 15 mg by mouth daily as needed.     NIFEdipine (PROCARDIA XL/NIFEDICAL XL) 60 MG 24 hr tablet TAKE ONE TABLET BY MOUTH DAILY 90 tablet 2   Olopatadine HCl 0.2 % SOLN Apply 1 drop to eye daily as needed (itchy/watery eyes). 2.5 mL 5   omeprazole (PRILOSEC) 40 MG capsule Take 1 capsule (40 mg total) by mouth daily. 90 capsule 1   ondansetron (ZOFRAN) 4 MG tablet Take 1 tablet (4 mg total) by mouth every 8 (eight) hours as needed for nausea or vomiting. 20 tablet 0   potassium chloride SA (KLOR-CON M) 20 MEQ tablet Take 1 tablet (20 mEq total) by mouth daily. 90 tablet 3   Prenatal Vit-Fe Fumarate-FA (PRENATAL MULTIVITAMIN) TABS tablet Take 1 tablet by mouth daily at 12 noon.     tiZANidine (ZANAFLEX) 4 MG tablet Take 4 mg by mouth at bedtime.     TRULANCE 3 MG TABS Take 1 tablet (3 mg total) by mouth daily. 30 tablet 2   valACYclovir (VALTREX) 1000 MG tablet TAKE ONE TABLET BY MOUTH DAILY 90 tablet 4   No facility-administered medications prior to visit.    Allergies  Allergen Reactions   Ace Inhibitors     Swelling og tongue and lips   Adhesive [Tape] Rash   Latex Itching and Rash    Rash and itching from use of gloves    ROS     Objective:    Physical Exam  LMP 10/15/2022 (Exact Date)   Breastfeeding No  Wt Readings from Last 3 Encounters:  10/29/22 175 lb (79.4 kg)  10/10/22 175 lb 6.4 oz (79.6 kg)  09/18/22 175 lb (79.4 kg)   Alert and oriented. No distress. Sinus tenderness when she palpates her maxillary  sinuses. Respirations unlabored. Speaking in complete sentences. Normal mood.     Assessment & Plan:   Problem List Items Addressed This Visit   None Visit Diagnoses     Acute sinusitis with symptoms > 10 days    -  Primary   Relevant Medications   amoxicillin-clavulanate (AUGMENTIN) 875-125 MG tablet   Acute cough          Augmentin prescribed. Discussed symptomatic management.  Follow up if worsening or if not back to baseline after finishing the antibiotic.   I am having Denise [name] start on amoxicillin-clavulanate. I am also having her maintain her prenatal multivitamin, tiZANidine, meloxicam, valACYclovir, ondansetron, NIFEdipine, indapamide, hydrocortisone-pramoxine, hydrocortisone, potassium chloride SA, Olopatadine HCl, famotidine, Trulance, cetirizine, lisdexamfetamine, cromolyn, azelastine, fluticasone, and omeprazole.  Meds ordered this encounter  Medications   amoxicillin-clavulanate (AUGMENTIN) 875-125 MG tablet    Sig: Take 1 tablet by mouth 2 (two) times daily.    Dispense:  20 tablet    Refill:  0    Order Specific Question:   Supervising Provider    Answer:   Hillard Danker A [[id number]]    I discussed the assessment and treatment plan with the patient. The patient was provided an opportunity to ask questions and all were answered. The patient agreed with the plan and demonstrated an understanding of the instructions.   The patient was advised to call back or seek an in-person evaluation if the symptoms worsen or if the condition fails to improve as anticipated.  I provided 15 minutes of face-to-face time during this encounter.   Hetty Blend, NP-C [facility] [place] [phone] (  phone) [phone] (fax)  [facility]

## 2022-11-02 ENCOUNTER — Encounter: Payer: Self-pay | Admitting: Internal Medicine

## 2022-11-05 ENCOUNTER — Encounter: Payer: Self-pay | Admitting: Family Medicine

## 2022-11-06 NOTE — Telephone Encounter (Signed)
Looks like you just talked about sinusitis at your virtual visit on 6/5

## 2022-11-08 ENCOUNTER — Other Ambulatory Visit: Payer: Self-pay | Admitting: Internal Medicine

## 2022-11-08 MED ORDER — LISDEXAMFETAMINE DIMESYLATE 30 MG PO CAPS: 30.0000 mg | ORAL_CAPSULE | Freq: Every day | ORAL | 0 refills | Status: DC

## 2022-11-13 ENCOUNTER — Ambulatory Visit: Payer: Medicaid Other | Admitting: Family Medicine

## 2022-11-13 ENCOUNTER — Encounter: Payer: Self-pay | Admitting: Family Medicine

## 2022-11-13 VITALS — BP 122/72 | HR 93 | Temp 97.8°F | Ht 64.0 in | Wt 168.0 lb

## 2022-11-13 DIAGNOSIS — T3695XA Adverse effect of unspecified systemic antibiotic, initial encounter: Secondary | ICD-10-CM | POA: Diagnosis not present

## 2022-11-13 DIAGNOSIS — L282 Other prurigo: Secondary | ICD-10-CM | POA: Diagnosis not present

## 2022-11-13 DIAGNOSIS — R110 Nausea: Secondary | ICD-10-CM

## 2022-11-13 NOTE — Progress Notes (Signed)
Subjective:     Patient ID: Denise [name], female    DOB: December 31, 1975, 47 y.o.   MRN: [id number]  Chief Complaint  Patient presents with   Rash    Bilateral leg rash started Saturday, using son's cortisone cream and stopped taking antibiotic and seems to have gotten better. Started on ankle then rash radiated up to knees    Rash    Discussed the use of AI scribe software for clinical note transcription with the patient, who gave verbal consent to proceed.  History of Present Illness         C/o rash on legs x 4 days. States rash started after being on Augmentin for 2-3 days. Noticed bumps on her ankles and lower legs, slightly itchy,  States she could feel the rash but could not see it initially and then had red dots on lower legs.  She also had nausea.  States she stopped the medication on 11/08/2022 and using topical hydrocortisone.   Denies fever, chills, dizziness, chest pain, palpitations, shortness of breath, abdominal pain, vomiting and diarrhea.   States her mother, sister and child are all allergic.   Health Maintenance Due  Topic Date Due   COVID-19 Vaccine (3 - 2023-24 season) 01/26/2022    Past Medical History:  Diagnosis Date   Anemia    during pregnancy    Chronic headaches    Colon polyps    Complication of anesthesia    crying excessive spitting   Concussion age 66 or 76 no residual   Family history of adverse reaction to anesthesia    ponv   GERD (gastroesophageal reflux disease)    GERD (gastroesophageal reflux disease)    HSV-2 infection    2008   Hypertension    IBS (irritable bowel syndrome)    IC (interstitial cystitis)    2008   Migraine    Wears glasses     Past Surgical History:  Procedure Laterality Date   ADENOIDECTOMY  as child   BREAST SURGERY  2002   right breast biopsy Benign   CESAREAN SECTION N/A 08/30/2013   Procedure: CESAREAN SECTION;  Surgeon: Serita Kyle, MD;  Location: [facility];  Service: Obstetrics;   Laterality: N/A;   colonscopy  04/18/2020   and endoscopy done   DILATION AND EVACUATION N/A 06/23/2020   Procedure: DILATATION AND EVACUATION;  Surgeon: Jerene Bears, MD;  Location: [facility];  Service: Gynecology;  Laterality: N/A;   KNEE ARTHROSCOPY  3/11, 2012   right knee   SHOULDER ARTHROSCOPY Right 2010   TONSILLECTOMY  as child   and adenoids   WISDOM TOOTH EXTRACTION  2008    Family History  Problem Relation Age of Onset   Diabetes Mother    Hyperlipidemia Mother    Anemia Mother    Irritable bowel syndrome Mother    Hyperlipidemia Father    Hypertension Father    Heart disease Father    Colon polyps Father    Kidney disease Father    Stroke Sister    Hypertension Sister    Kidney disease Sister    Hypertension Sister    Thyroid disease Sister    Heart disease Brother    Diabetes Maternal Grandmother    Heart failure Maternal Grandmother    Prostate cancer Maternal Grandfather    Diabetes Paternal Grandmother    Stroke Paternal Grandmother    Renal Disease Paternal Grandfather    Heart disease Paternal Grandfather    Stroke  Paternal Grandfather    Anemia Maternal Aunt    Prostate cancer Paternal Uncle    Kidney disease Maternal Uncle    Colon cancer Neg Hx    Stomach cancer Neg Hx    Esophageal cancer Neg Hx     Social History   Socioeconomic History   Marital status: Married    Spouse name: Not on file   Number of children: 1   Years of education: Not on file   Highest education level: Bachelor's degree (e.g., BA, AB, BS)  Occupational History   Occupation: home maker  Tobacco Use   Smoking status: Never   Smokeless tobacco: Never  Vaping Use   Vaping Use: Never used  Substance and Sexual Activity   Alcohol use: Not Currently   Drug use: Yes    Types: Marijuana    Comment: marijuana last use sept 2021   Sexual activity: Yes    Partners: Male    Birth control/protection: None  Other Topics Concern   Not on file  Social  History Narrative   Not on file   Social Determinants of Health   Financial Resource Strain: Patient Declined (09/03/2022)   Overall Financial Resource Strain (CARDIA)    Difficulty of Paying Living Expenses: Patient declined  Food Insecurity: Patient Declined (09/03/2022)   Hunger Vital Sign    Worried About Running Out of Food in the Last Year: Patient declined    Ran Out of Food in the Last Year: Patient declined  Transportation Needs: No Transportation Needs (09/03/2022)   PRAPARE - Administrator, Civil Service (Medical): No    Lack of Transportation (Non-Medical): No  Physical Activity: Insufficiently Active (09/03/2022)   Exercise Vital Sign    Days of Exercise per Week: 3 days    Minutes of Exercise per Session: 30 min  Stress: No Stress Concern Present (09/03/2022)   [employer] of Occupational Health - Occupational Stress Questionnaire    Feeling of Stress : Only a little  Social Connections: Unknown (09/03/2022)   Social Connection and Isolation Panel [NHANES]    Frequency of Communication with Friends and Family: More than three times a week    Frequency of Social Gatherings with Friends and Family: Once a week    Attends Religious Services: Patient declined    Database administrator or Organizations: No    Attends Engineer, structural: Not on file    Marital Status: Patient declined  Intimate Partner Violence: Not on file    Outpatient Medications Prior to Visit  Medication Sig Dispense Refill   azelastine (ASTELIN) 0.1 % nasal spray Place 1-2 sprays into both nostrils 2 (two) times daily as needed (nasal drainage). Use in each nostril as directed 30 mL 5   cetirizine (ZYRTEC ALLERGY) 10 MG tablet May take 10mg  1-2 timer per day for hives and allergies. 60 tablet 5   cromolyn (OPTICROM) 4 % ophthalmic solution Place 1 drop into both eyes 4 (four) times daily as needed (itchy/watery eyes). 10 mL 5   famotidine (PEPCID) 20 MG tablet Take 1 tablet (20 mg  total) by mouth 2 (two) times daily as needed for heartburn or indigestion. 60 tablet 2   fluticasone (FLONASE) 50 MCG/ACT nasal spray Place 1 spray into both nostrils 2 (two) times daily as needed (nasal congestion). 16 g 5   hydrocortisone 2.5 % cream Apply topically 2 (two) times daily. 30 g 0   hydrocortisone-pramoxine (PROCTOFOAM-HC) rectal foam Place 1 applicator rectally 2 (  two) times daily. 10 g 2   indapamide (LOZOL) 1.25 MG tablet Take 1 tablet (1.25 mg total) by mouth daily. 90 tablet 2   lisdexamfetamine (VYVANSE) 30 MG capsule Take 1 capsule (30 mg total) by mouth daily. 30 capsule 0   meloxicam (MOBIC) 15 MG tablet Take 15 mg by mouth daily as needed.     NIFEdipine (PROCARDIA XL/NIFEDICAL XL) 60 MG 24 hr tablet TAKE ONE TABLET BY MOUTH DAILY 90 tablet 2   Olopatadine HCl 0.2 % SOLN Apply 1 drop to eye daily as needed (itchy/watery eyes). 2.5 mL 5   omeprazole (PRILOSEC) 40 MG capsule Take 1 capsule (40 mg total) by mouth daily. 90 capsule 1   ondansetron (ZOFRAN) 4 MG tablet Take 1 tablet (4 mg total) by mouth every 8 (eight) hours as needed for nausea or vomiting. 20 tablet 0   potassium chloride SA (KLOR-CON M) 20 MEQ tablet Take 1 tablet (20 mEq total) by mouth daily. 90 tablet 3   Prenatal Vit-Fe Fumarate-FA (PRENATAL MULTIVITAMIN) TABS tablet Take 1 tablet by mouth daily at 12 noon.     tiZANidine (ZANAFLEX) 4 MG tablet Take 4 mg by mouth at bedtime.     TRULANCE 3 MG TABS Take 1 tablet (3 mg total) by mouth daily. 30 tablet 2   valACYclovir (VALTREX) 1000 MG tablet TAKE ONE TABLET BY MOUTH DAILY 90 tablet 4   amoxicillin-clavulanate (AUGMENTIN) 875-125 MG tablet Take 1 tablet by mouth 2 (two) times daily. (Patient not taking: Reported on 11/13/2022) 20 tablet 0   No facility-administered medications prior to visit.    Allergies  Allergen Reactions   Ace Inhibitors     Swelling og tongue and lips   Adhesive [Tape] Rash   Augmentin [Amoxicillin-Pot Clavulanate] Rash     Rash on lower legs   Latex Itching and Rash    Rash and itching from use of gloves    Review of Systems  Skin:  Positive for rash.       Objective:    Physical Exam Constitutional:      General: She is not in acute distress.    Appearance: She is not ill-appearing.  HENT:     Mouth/Throat:     Pharynx: Oropharynx is clear.  Eyes:     Extraocular Movements: Extraocular movements intact.     Conjunctiva/sclera: Conjunctivae normal.  Cardiovascular:     Rate and Rhythm: Normal rate.  Pulmonary:     Effort: Pulmonary effort is normal.  Musculoskeletal:     Cervical back: Normal range of motion and neck supple.  Skin:    General: Skin is warm and dry.     Findings: No erythema or rash.  Neurological:     General: No focal deficit present.     Mental Status: She is alert and oriented to person, place, and time.  Psychiatric:        Mood and Affect: Mood normal.        Behavior: Behavior normal.        Thought Content: Thought content normal.      BP 122/72 (BP Location: Left Arm, Patient Position: Sitting, Cuff Size: Large)   Pulse 93   Temp 97.8 F (36.6 C) (Temporal)   Ht 5\' 4"  (1.626 m)   Wt 168 lb (76.2 kg)   LMP 10/15/2022 (Exact Date)   SpO2 99%   BMI 28.84 kg/m  Wt Readings from Last 3 Encounters:  11/13/22 168 lb (76.2 kg)  10/29/22 175  lb (79.4 kg)  10/10/22 175 lb 6.4 oz (79.6 kg)       Assessment & Plan:   Problem List Items Addressed This Visit   None Visit Diagnoses     Pruritic rash    -  Primary   Antibiotic causing adverse effect       Nausea          No visible rash today. Basically she is back to her usual state of health. We will list Augmentin as a new allergy. She seemed to have taken enough to treat her sinusitis.  Nausea resolved after stopping antibiotic.  Follow up as needed with PCP.   I have discontinued Kamela Edmonston's amoxicillin-clavulanate. I am also having her maintain her prenatal multivitamin, tiZANidine, meloxicam,  valACYclovir, ondansetron, NIFEdipine, indapamide, hydrocortisone-pramoxine, hydrocortisone, potassium chloride SA, Olopatadine HCl, famotidine, Trulance, cetirizine, cromolyn, azelastine, fluticasone, omeprazole, and lisdexamfetamine.  No orders of the defined types were placed in this encounter.

## 2022-12-05 ENCOUNTER — Ambulatory Visit: Payer: Medicaid Other | Admitting: Internal Medicine

## 2022-12-07 ENCOUNTER — Other Ambulatory Visit: Payer: Self-pay | Admitting: Internal Medicine

## 2022-12-07 MED ORDER — LISDEXAMFETAMINE DIMESYLATE 30 MG PO CAPS: 30.0000 mg | ORAL_CAPSULE | Freq: Every day | ORAL | 0 refills | Status: DC

## 2022-12-17 ENCOUNTER — Ambulatory Visit: Payer: Medicaid Other | Admitting: Internal Medicine

## 2023-01-02 DIAGNOSIS — Z1231 Encounter for screening mammogram for malignant neoplasm of breast: Secondary | ICD-10-CM | POA: Diagnosis not present

## 2023-01-08 ENCOUNTER — Encounter (HOSPITAL_BASED_OUTPATIENT_CLINIC_OR_DEPARTMENT_OTHER): Payer: Self-pay | Admitting: *Deleted

## 2023-01-08 ENCOUNTER — Encounter: Payer: Self-pay | Admitting: Internal Medicine

## 2023-01-08 MED ORDER — LISDEXAMFETAMINE DIMESYLATE 30 MG PO CAPS: 30.0000 mg | ORAL_CAPSULE | Freq: Every day | ORAL | 0 refills | Status: DC

## 2023-01-10 ENCOUNTER — Encounter (INDEPENDENT_AMBULATORY_CARE_PROVIDER_SITE_OTHER): Payer: Self-pay

## 2023-02-03 ENCOUNTER — Other Ambulatory Visit: Payer: Self-pay | Admitting: Internal Medicine

## 2023-02-05 ENCOUNTER — Encounter: Payer: Self-pay | Admitting: Internal Medicine

## 2023-02-05 NOTE — Patient Instructions (Addendum)
    An EKG was done    Blood work was ordered.   The lab is on the first floor.    Medications changes include :  none    A referral was ordered for cardiology.     Return in about 6 months (around 08/06/2023) for Physical Exam.

## 2023-02-05 NOTE — Progress Notes (Unsigned)
Subjective:    Patient ID: Denise [name], female    DOB: 11-29-1975, 47 y.o.   MRN: [id number]     HPI Denise [name] is here for follow up of her chronic medical problems.  Tightness in left neck - just anterior to SCM muscle.  Tightens intermittently for over a year.  She takes ASA 81 mg at times.  Often occurs with palpitations.   She is exercising.  She is eating well.    The past two weeks she has been very tired. She is having bad hot flashes, sweats.  She had some chills prior to the hot flashes and was concerned about her iron.    Menses are irregular.  If she does not get her menses she will get hotflashes.     Recently had an episode - had difficulty and pain with drinking something - lasted for a while - no other episodes.  Plans to discuss with GI.   Medications and allergies reviewed with patient and updated if appropriate.  Current Outpatient Medications on File Prior to Visit  Medication Sig Dispense Refill   azelastine (ASTELIN) 0.1 % nasal spray Place 1-2 sprays into both nostrils 2 (two) times daily as needed (nasal drainage). Use in each nostril as directed 30 mL 5   cetirizine (ZYRTEC ALLERGY) 10 MG tablet May take 10mg  1-2 timer per day for hives and allergies. 60 tablet 5   cromolyn (OPTICROM) 4 % ophthalmic solution Place 1 drop into both eyes 4 (four) times daily as needed (itchy/watery eyes). 10 mL 5   fluticasone (FLONASE) 50 MCG/ACT nasal spray Place 1 spray into both nostrils 2 (two) times daily as needed (nasal congestion). 16 g 5   hydrocortisone 2.5 % cream Apply topically 2 (two) times daily. 30 g 0   hydrocortisone-pramoxine (PROCTOFOAM-HC) rectal foam Place 1 applicator rectally 2 (two) times daily. 10 g 2   indapamide (LOZOL) 1.25 MG tablet Take 1 tablet (1.25 mg total) by mouth daily. 90 tablet 2   lisdexamfetamine (VYVANSE) 30 MG capsule Take 1 capsule (30 mg total) by mouth daily. 30 capsule 0   meloxicam (MOBIC) 15 MG tablet Take 15 mg by mouth  daily as needed.     NIFEdipine (PROCARDIA XL/NIFEDICAL XL) 60 MG 24 hr tablet TAKE 1 TABLET BY MOUTH DAILY 90 tablet 2   Olopatadine HCl 0.2 % SOLN Apply 1 drop to eye daily as needed (itchy/watery eyes). 2.5 mL 5   omeprazole (PRILOSEC) 40 MG capsule Take 1 capsule (40 mg total) by mouth daily. 90 capsule 1   ondansetron (ZOFRAN) 4 MG tablet Take 1 tablet (4 mg total) by mouth every 8 (eight) hours as needed for nausea or vomiting. 20 tablet 0   potassium chloride SA (KLOR-CON M) 20 MEQ tablet Take 1 tablet (20 mEq total) by mouth daily. 90 tablet 3   Prenatal Vit-Fe Fumarate-FA (PRENATAL MULTIVITAMIN) TABS tablet Take 1 tablet by mouth daily at 12 noon.     tiZANidine (ZANAFLEX) 4 MG tablet Take 4 mg by mouth at bedtime.     TRULANCE 3 MG TABS Take 1 tablet (3 mg total) by mouth daily. 30 tablet 2   valACYclovir (VALTREX) 1000 MG tablet TAKE ONE TABLET BY MOUTH DAILY 90 tablet 4   famotidine (PEPCID) 20 MG tablet Take 1 tablet (20 mg total) by mouth 2 (two) times daily as needed for heartburn or indigestion. 60 tablet 2   No current facility-administered medications on file prior to visit.  Review of Systems  Constitutional:  Positive for chills. Negative for fever.       Hotflashes  Respiratory:  Negative for cough, shortness of breath and wheezing.   Cardiovascular:  Positive for palpitations (occ). Negative for chest pain and leg swelling.  Genitourinary:  Positive for menstrual problem (irregular).  Neurological:  Positive for dizziness (occ) and headaches. Negative for light-headedness.       Objective:   Vitals:   02/06/23 1016  BP: 114/70  Pulse: (!) 112  Temp: 98.6 F (37 C)  SpO2: 96%   BP Readings from Last 3 Encounters:  02/06/23 114/70  11/13/22 122/72  10/29/22 125/69   Wt Readings from Last 3 Encounters:  02/06/23 158 lb 9.6 oz (71.9 kg)  11/13/22 168 lb (76.2 kg)  10/29/22 175 lb (79.4 kg)   Body mass index is 27.22 kg/m.    Physical  Exam Constitutional:      General: She is not in acute distress.    Appearance: Normal appearance.  HENT:     Head: Normocephalic and atraumatic.  Eyes:     Conjunctiva/sclera: Conjunctivae normal.  Cardiovascular:     Rate and Rhythm: Regular rhythm. Tachycardia present.     Heart sounds: Normal heart sounds.  Pulmonary:     Effort: Pulmonary effort is normal. No respiratory distress.     Breath sounds: Normal breath sounds. No wheezing.  Musculoskeletal:     Cervical back: Neck supple.     Right lower leg: No edema.     Left lower leg: No edema.  Lymphadenopathy:     Cervical: No cervical adenopathy.  Skin:    General: Skin is warm and dry.     Findings: No rash.  Neurological:     Mental Status: She is alert. Mental status is at baseline.  Psychiatric:        Mood and Affect: Mood normal.        Behavior: Behavior normal.        Lab Results  Component Value Date   WBC 5.1 08/06/2022   HGB 14.0 08/06/2022   HCT 40.4 08/06/2022   PLT 264.0 08/06/2022   GLUCOSE 76 08/06/2022   CHOL 263 (H) 08/06/2022   TRIG 69.0 08/06/2022   HDL 90.60 08/06/2022   LDLCALC 159 (H) 08/06/2022   ALT 18 08/06/2022   AST 22 08/06/2022   NA 140 08/06/2022   K 3.6 08/06/2022   CL 99 08/06/2022   CREATININE 0.79 08/06/2022   BUN 14 08/06/2022   CO2 31 08/06/2022   TSH 2.03 08/06/2022   INR 1.0 07/26/2020   HGBA1C 5.6 08/06/2022     Assessment & Plan:    See Problem List for Assessment and Plan of chronic medical problems.

## 2023-02-06 ENCOUNTER — Encounter: Payer: Self-pay | Admitting: Internal Medicine

## 2023-02-06 ENCOUNTER — Ambulatory Visit: Payer: Medicaid Other | Admitting: Internal Medicine

## 2023-02-06 VITALS — BP 114/70 | HR 112 | Temp 98.6°F | Ht 64.0 in | Wt 158.6 lb

## 2023-02-06 DIAGNOSIS — R739 Hyperglycemia, unspecified: Secondary | ICD-10-CM

## 2023-02-06 DIAGNOSIS — R000 Tachycardia, unspecified: Secondary | ICD-10-CM | POA: Diagnosis not present

## 2023-02-06 DIAGNOSIS — E782 Mixed hyperlipidemia: Secondary | ICD-10-CM

## 2023-02-06 DIAGNOSIS — I10 Essential (primary) hypertension: Secondary | ICD-10-CM

## 2023-02-06 DIAGNOSIS — R002 Palpitations: Secondary | ICD-10-CM | POA: Diagnosis not present

## 2023-02-06 DIAGNOSIS — F988 Other specified behavioral and emotional disorders with onset usually occurring in childhood and adolescence: Secondary | ICD-10-CM | POA: Diagnosis not present

## 2023-02-06 DIAGNOSIS — N926 Irregular menstruation, unspecified: Secondary | ICD-10-CM | POA: Diagnosis not present

## 2023-02-06 LAB — COMPREHENSIVE METABOLIC PANEL
ALT: 19 U/L (ref 0–35)
AST: 26 U/L (ref 0–37)
Albumin: 4.6 g/dL (ref 3.5–5.2)
Alkaline Phosphatase: 86 U/L (ref 39–117)
BUN: 18 mg/dL (ref 6–23)
CO2: 32 meq/L (ref 19–32)
Calcium: 10.6 mg/dL — ABNORMAL HIGH (ref 8.4–10.5)
Chloride: 98 meq/L (ref 96–112)
Creatinine, Ser: 0.88 mg/dL (ref 0.40–1.20)
GFR: 79.34 mL/min (ref >60.00)
Glucose, Bld: 89 mg/dL (ref 70–99)
Potassium: 3.7 meq/L (ref 3.5–5.1)
Sodium: 139 meq/L (ref 135–145)
Total Bilirubin: 1.4 mg/dL — ABNORMAL HIGH (ref 0.2–1.2)
Total Protein: 7.9 g/dL (ref 6.0–8.3)

## 2023-02-06 LAB — CBC WITH DIFFERENTIAL/PLATELET
Basophils Absolute: 0 10*3/uL (ref 0.0–0.1)
Basophils Relative: 0.7 % (ref 0.0–3.0)
Eosinophils Absolute: 0 10*3/uL (ref 0.0–0.7)
Eosinophils Relative: 0.6 % (ref 0.0–5.0)
HCT: 42 % (ref 36.0–46.0)
Hemoglobin: 14.3 g/dL (ref 12.0–15.0)
Lymphocytes Relative: 42.6 % (ref 12.0–46.0)
Lymphs Abs: 1.9 10*3/uL (ref 0.7–4.0)
MCHC: 34.1 g/dL (ref 30.0–36.0)
MCV: 83.6 fl (ref 78.0–100.0)
Monocytes Absolute: 0.5 10*3/uL (ref 0.1–1.0)
Monocytes Relative: 10.1 % (ref 3.0–12.0)
Neutro Abs: 2.1 10*3/uL (ref 1.4–7.7)
Neutrophils Relative %: 46 % (ref 43.0–77.0)
Platelets: 263 10*3/uL (ref 150.0–400.0)
RBC: 5.02 Mil/uL (ref 3.87–5.11)
RDW: 13.8 % (ref 11.5–15.5)
WBC: 4.5 10*3/uL (ref 4.0–10.5)

## 2023-02-06 LAB — LIPID PANEL
Cholesterol: 251 mg/dL — ABNORMAL HIGH (ref 0–200)
HDL: 87.3 mg/dL (ref >39.00)
LDL Cholesterol: 153 mg/dL — ABNORMAL HIGH (ref 0–99)
NonHDL: 164
Total CHOL/HDL Ratio: 3
Triglycerides: 56 mg/dL (ref 0.0–149.0)
VLDL: 11.2 mg/dL (ref 0.0–40.0)

## 2023-02-06 LAB — IBC PANEL
Iron: 107 ug/dL (ref 42–145)
Saturation Ratios: 33.8 % (ref 20.0–50.0)
TIBC: 316.4 ug/dL (ref 250.0–450.0)
Transferrin: 226 mg/dL (ref 212.0–360.0)

## 2023-02-06 LAB — HEMOGLOBIN A1C: Hgb A1c MFr Bld: 5.2 % (ref 4.6–6.5)

## 2023-02-06 LAB — FERRITIN: Ferritin: 102 ng/mL (ref 10.0–291.0)

## 2023-02-06 LAB — TSH: TSH: 2.13 u[IU]/mL (ref 0.35–5.50)

## 2023-02-06 MED ORDER — NIFEDIPINE ER OSMOTIC RELEASE 60 MG PO TB24: 60.0000 mg | ORAL_TABLET | Freq: Every day | ORAL | 2 refills | Status: DC

## 2023-02-06 MED ORDER — INDAPAMIDE 1.25 MG PO TABS: 1.2500 mg | ORAL_TABLET | Freq: Every day | ORAL | 2 refills | Status: DC

## 2023-02-06 MED ORDER — LISDEXAMFETAMINE DIMESYLATE 30 MG PO CAPS: 30.0000 mg | ORAL_CAPSULE | Freq: Every day | ORAL | 0 refills | Status: DC

## 2023-02-06 NOTE — Assessment & Plan Note (Signed)
Chronic Blood pressure well controlled CMP Continue indapamide 1.25 mg daily, nifedipine XL 60 mg daily

## 2023-02-06 NOTE — Assessment & Plan Note (Signed)
Chronic Likely perimenopausal and within normal limits Has had some hot flashes, chills Will check CBC, iron panel, TSH

## 2023-02-06 NOTE — Assessment & Plan Note (Signed)
Chronic Regular exercise and healthy diet encouraged Check lipid panel ASCVD risk is low Continue lifestyle control

## 2023-02-06 NOTE — Assessment & Plan Note (Signed)
Chronic Has been experiencing increased palpitations Tachycardic here today, but not usually tachycardic Sometimes palpitations are accompanied with tightening in the left neck Sometimes she coughs and the palpitations will go away Does not happen on a weekly basis Will refer to cardio for further evaluation TSH, CBC, CMP today

## 2023-02-06 NOTE — Assessment & Plan Note (Signed)
Chronic Lab Results  Component Value Date   HGBA1C 5.6 08/06/2022   Check a1c Low sugar / carb diet Stressed regular exercise

## 2023-02-06 NOTE — Assessment & Plan Note (Signed)
New Heart rate elevated here today-sounds regular on exam EKG today: NSR at 83 bpm, normal EKG.  Compared to EKG from 2023 there is no change.

## 2023-02-06 NOTE — Assessment & Plan Note (Signed)
Chronic Controlled, Stable Continue Vyvanse 30 mg daily or take as needed

## 2023-02-15 ENCOUNTER — Encounter: Payer: Self-pay | Admitting: Internal Medicine

## 2023-02-15 ENCOUNTER — Ambulatory Visit: Payer: Medicaid Other | Admitting: Internal Medicine

## 2023-02-15 ENCOUNTER — Other Ambulatory Visit (INDEPENDENT_AMBULATORY_CARE_PROVIDER_SITE_OTHER): Payer: Medicaid Other

## 2023-02-15 VITALS — BP 116/68 | HR 68 | Ht 64.0 in | Wt 157.0 lb

## 2023-02-15 DIAGNOSIS — R109 Unspecified abdominal pain: Secondary | ICD-10-CM | POA: Diagnosis not present

## 2023-02-15 DIAGNOSIS — R17 Unspecified jaundice: Secondary | ICD-10-CM

## 2023-02-15 DIAGNOSIS — K5900 Constipation, unspecified: Secondary | ICD-10-CM

## 2023-02-15 DIAGNOSIS — K219 Gastro-esophageal reflux disease without esophagitis: Secondary | ICD-10-CM

## 2023-02-15 LAB — BILIRUBIN, DIRECT: Bilirubin, Direct: 0.2 mg/dL (ref 0.0–0.3)

## 2023-02-15 LAB — BILIRUBIN, TOTAL: Total Bilirubin: 1.3 mg/dL — ABNORMAL HIGH (ref 0.2–1.2)

## 2023-02-15 NOTE — Patient Instructions (Addendum)
Your provider has requested that you go to the basement level for lab work before leaving today. Press "B" on the elevator. The lab is located at the first door on the left as you exit the elevator.   You have been scheduled for a CT scan of the abdomen and pelvis at [facility], 1st floor Radiology. You are scheduled on 02/21/23 at 4:30 pm. You should arrive 15 minutes prior to your appointment time for registration.  You may take any medications as prescribed with a small amount of water, if necessary.   If you take any of the following medications: METFORMIN, GLUCOPHAGE, GLUCOVANCE, AVANDAMET, RIOMET, FORTAMET, ACTOPLUS MET, JANUMET, GLUMETZA or METAGLIP, you MAY be asked to HOLD this medication 48 hours AFTER the exam.   The purpose of you drinking the oral contrast is to aid in the visualization of your intestinal tract. The contrast solution may cause some diarrhea. Depending on your individual set of symptoms, you may also receive an intravenous injection of x-ray contrast/dye. Plan on being at [facility] for 45 minutes or longer, depending on the type of exam you are having performed.   If you have any questions regarding your exam or if you need to reschedule, you may call [facility] at [phone] between the hours of 8:00 am and 5:00 pm, Monday-Friday.   You are schedule for a follow up visit on 06/06/22  If your blood pressure at your visit was 140/90 or greater, please contact your primary care physician to follow up on this.  _______________________________________________________  If you are age 23 or older, your body mass index should be between 23-30. Your Body mass index is 26.95 kg/m. If this is out of the aforementioned range listed, please consider follow up with your Primary Care Provider.  If you are age 68 or younger, your body mass index should be between 19-25. Your Body mass index is 26.95 kg/m. If this is out of the aformentioned range listed,  please consider follow up with your Primary Care Provider.   ________________________________________________________  The  GI providers would like to encourage you to use [facility] to communicate with providers for non-urgent requests or questions.  Due to long hold times on the telephone, sending your provider a message by [facility] may be a faster and more efficient way to get a response.  Please allow 48 business hours for a response.  Please remember that this is for non-urgent requests.  _______________________________________________________  Due to recent changes in healthcare laws, you may see the results of your imaging and laboratory studies on MyChart before your provider has had a chance to review them.  We understand that in some cases there may be results that are confusing or concerning to you. Not all laboratory results come back in the same time frame and the provider may be waiting for multiple results in order to interpret others.  Please give [place] 48 hours in order for your provider to thoroughly review all the results before contacting the office for clarification of your results.   Thank you for entrusting me with your care and for choosing [facility], Dr. Eulah Pont

## 2023-02-15 NOTE — Progress Notes (Unsigned)
Chief Complaint: Constipation, abdominal pain  HPI : 47 year old female with history of GERD, IBS, migraine, anemia presents for follow up of constipation and abdominal pain  She has had alternating constipation and diarrhea since 2007. She had diarrhea more initially but has had more constipation recently. Bowel habits are irregular. Sometimes she will experience fecal urgency. When her constipation is more severe, she will experience some abdominal discomfort and nausea. She used to be on Linzess, which caused too much cramping and urgent stools, which was not sustainable. She used Trulance, which seemed to work (not as quickly as the [employer] but was more tolerable), but then stopped after she didn't see her prior GI provider Dr. Kinnie Scales for a few years. Miralax in the past has not worked. She does strain when having a BM. She has had hemorrhoids for the last 9 years (after the birth of her son), but they recently flared up. Her rectal pain was excruciating on 4/2-09/02/22. She could barely pass gas when the pain was severe. The pain has decreased in severity from a 10 to a 7.5. She saw her PCP on 09/04/22. She tried the Anusol HC cream prescribed by her PCP, which seemed to help. The Procto-foam was difficult to administer. Her last colonoscopy in 2021 showed a colon polyp. Prior to that, she had a colonoscopy 2012-2013 that showed a colon polyp. Her EGD were in 2021 was normal. She was recommended for 5 year follow up colonoscopy. She used to be on famotidine PRN for acid reflux but stopped this as well after she stopped seeing her prior GI provider. She does occasionally have trouble swallowing spit and foods. Denies vomiting or weight loss. Patient has family history of IBS, GERD, and hiatal hernia. Denies family history of colon cancer.  Interval History: At times she is experiencing some pain in the back that makes her feel gassy, which has been present a few months. This has happened a couple of times  and flared up a couple of weeks ago. She just tries to lay down and tries to use the bathroom. If she passes gas or a BM, then this can sometimes help the pain. The pain is not constant. She introduced fiber and she has been doing protein shakes in the morning. She hasn't had to take the Trulance much. Trulance will not work immediately but will work after about a day. She has lost about 25 lbs over the last 6 months. She has been losing weight as a results of diet and exercise. She took her PPI every day for 8 weeks. Her reflux is under better control than it has been in the past. Denies dysphagia. She will occasionally take a baby aspirin. She does drink alcohol socially with shots on occasion. She did have one episode of burning after taking a shot. Denies burning with urination or blood in urine.   Wt Readings from Last 3 Encounters:  02/15/23 157 lb (71.2 kg)  02/06/23 158 lb 9.6 oz (71.9 kg)  11/13/22 168 lb (76.2 kg)   Past Medical History:  Diagnosis Date   Anemia    during pregnancy    Chronic headaches    Colon polyps    Complication of anesthesia    crying excessive spitting   Concussion age 84 or 14 no residual   Family history of adverse reaction to anesthesia    ponv   GERD (gastroesophageal reflux disease)    GERD (gastroesophageal reflux disease)    HSV-2 infection  2008   Hypertension    IBS (irritable bowel syndrome)    IC (interstitial cystitis)    2008   Migraine    Wears glasses      Past Surgical History:  Procedure Laterality Date   ADENOIDECTOMY  as child   BREAST SURGERY  2002   right breast biopsy Benign   CESAREAN SECTION N/A 08/30/2013   Procedure: CESAREAN SECTION;  Surgeon: Serita Kyle, MD;  Location: [facility];  Service: Obstetrics;  Laterality: N/A;   colonscopy  04/18/2020   and endoscopy done   DILATION AND EVACUATION N/A 06/23/2020   Procedure: DILATATION AND EVACUATION;  Surgeon: Jerene Bears, MD;  Location: [facility];  Service: Gynecology;  Laterality: N/A;   KNEE ARTHROSCOPY  3/11, 2012   right knee   SHOULDER ARTHROSCOPY Right 2010   TONSILLECTOMY  as child   and adenoids   WISDOM TOOTH EXTRACTION  2008   Family History  Problem Relation Age of Onset   Diabetes Mother    Hyperlipidemia Mother    Anemia Mother    Irritable bowel syndrome Mother    Hyperlipidemia Father    Hypertension Father    Heart disease Father    Colon polyps Father    Kidney disease Father    Stroke Sister    Hypertension Sister    Kidney disease Sister    Hypertension Sister    Thyroid disease Sister    Heart disease Brother    Diabetes Maternal Grandmother    Heart failure Maternal Grandmother    Prostate cancer Maternal Grandfather    Diabetes Paternal Grandmother    Stroke Paternal Grandmother    Renal Disease Paternal Grandfather    Heart disease Paternal Grandfather    Stroke Paternal Grandfather    Anemia Maternal Aunt    Prostate cancer Paternal Uncle    Kidney disease Maternal Uncle    Colon cancer Neg Hx    Stomach cancer Neg Hx    Esophageal cancer Neg Hx    Social History   Tobacco Use   Smoking status: Never   Smokeless tobacco: Never  Vaping Use   Vaping status: Never Used  Substance Use Topics   Alcohol use: Not Currently   Drug use: Yes    Types: Marijuana    Comment: marijuana last use sept 2021   Current Outpatient Medications  Medication Sig Dispense Refill   azelastine (ASTELIN) 0.1 % nasal spray Place 1-2 sprays into both nostrils 2 (two) times daily as needed (nasal drainage). Use in each nostril as directed 30 mL 5   cetirizine (ZYRTEC ALLERGY) 10 MG tablet May take 10mg  1-2 timer per day for hives and allergies. 60 tablet 5   cromolyn (OPTICROM) 4 % ophthalmic solution Place 1 drop into both eyes 4 (four) times daily as needed (itchy/watery eyes). 10 mL 5   fluticasone (FLONASE) 50 MCG/ACT nasal spray Place 1 spray into both nostrils 2 (two) times daily as needed  (nasal congestion). 16 g 5   hydrocortisone 2.5 % cream Apply topically 2 (two) times daily. 30 g 0   hydrocortisone-pramoxine (PROCTOFOAM-HC) rectal foam Place 1 applicator rectally 2 (two) times daily. 10 g 2   indapamide (LOZOL) 1.25 MG tablet Take 1 tablet (1.25 mg total) by mouth daily. 90 tablet 2   lisdexamfetamine (VYVANSE) 30 MG capsule Take 1 capsule (30 mg total) by mouth daily. 30 capsule 0   meloxicam (MOBIC) 15 MG tablet Take 15 mg by mouth  daily as needed.     NIFEdipine (PROCARDIA XL/NIFEDICAL XL) 60 MG 24 hr tablet Take 1 tablet (60 mg total) by mouth daily. 90 tablet 2   Olopatadine HCl 0.2 % SOLN Apply 1 drop to eye daily as needed (itchy/watery eyes). 2.5 mL 5   omeprazole (PRILOSEC) 40 MG capsule Take 1 capsule (40 mg total) by mouth daily. 90 capsule 1   ondansetron (ZOFRAN) 4 MG tablet Take 1 tablet (4 mg total) by mouth every 8 (eight) hours as needed for nausea or vomiting. 20 tablet 0   potassium chloride SA (KLOR-CON M) 20 MEQ tablet Take 1 tablet (20 mEq total) by mouth daily. 90 tablet 3   Prenatal Vit-Fe Fumarate-FA (PRENATAL MULTIVITAMIN) TABS tablet Take 1 tablet by mouth daily at 12 noon.     tiZANidine (ZANAFLEX) 4 MG tablet Take 4 mg by mouth at bedtime.     TRULANCE 3 MG TABS Take 1 tablet (3 mg total) by mouth daily. 30 tablet 2   valACYclovir (VALTREX) 1000 MG tablet TAKE ONE TABLET BY MOUTH DAILY 90 tablet 4   famotidine (PEPCID) 20 MG tablet Take 1 tablet (20 mg total) by mouth 2 (two) times daily as needed for heartburn or indigestion. 60 tablet 2   No current facility-administered medications for this visit.   Allergies  Allergen Reactions   Ace Inhibitors     Swelling og tongue and lips   Adhesive [Tape] Rash   Augmentin [Amoxicillin-Pot Clavulanate] Rash    Rash on lower legs   Latex Itching and Rash    Rash and itching from use of gloves    Review of Systems: All systems reviewed and negative except where noted in HPI.   Physical Exam: BP  116/68   Pulse 68   Ht 5\' 4"  (1.626 m)   Wt 157 lb (71.2 kg)   LMP 12/14/2022   BMI 26.95 kg/m  Constitutional: Pleasant,well-developed, female in no acute distress. HEENT: Normocephalic and atraumatic. Conjunctivae are normal. No scleral icterus. Cardiovascular: Normal rate, regular rhythm.  Pulmonary/chest: Effort normal and breath sounds normal. No wheezing, rales or rhonchi. Abdominal: Soft, nondistended, nontender. Bowel sounds active throughout. There are no masses palpable. No hepatomegaly. Rectal: Grade 3 internal hemorrhoids. Extremities: No edema Neurological: Alert and oriented to person place and time. Skin: Skin is warm and dry. No rashes noted. Psychiatric: Normal mood and affect. Behavior is normal.  Labs 02/2022 : Lipase nml.   Labs 07/2022: CBC unremarkable. CMP nml. TSH nml. HbA1C 5.6%  Labs 01/2023: CBC nml. Ferritin nml. TSH nml. CMP with elevated total bilirubin of 1.4.   CTA C/A/P w/contrast 03/16/22: IMPRESSION: 1. No acute intrathoracic pathology. No aortic aneurysm or dissection. 2. Findings suspicious for right pyelonephritis. Correlation with urinalysis recommended. No drainable fluid collection or abscess.  EGD 10/29/22: - Normal esophagus. Biopsied.  - Gastritis. Biopsied.  - Normal examined duodenum. Path: 1. 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 H pylori was negative NEGATIVE FOR INTESTINAL METAPLASIA OR DYSPLASIA. 2. Surgical [P], esophagus ESOPHAGEAL SQUAMOUS MUCOSA WITH NO SIGNIFICANT DIAGNOSTIC ALTERATION. NO EVIDENCE OF INTRAEPITHELIAL EOSINOPHILS OR LYMPHOCYTES.  Colonoscopy 10/29/22: - Non- bleeding internal hemorrhoids.  - The examination was otherwise normal.  - No specimens collected.  ASSESSMENT AND PLAN: Left flank pain Constipation Hemorrhoids GERD Mild dysphagia Patient presents with longstanding constipation and a recent exacerbation of her hemorrhoids.  I did see prolapsed  hemorrhoids on her rectal exam today.  Recommended that she continue using  her Anusol HC cream for at least 1 more week.  She should try to avoid straining when passing BMs and use Sitz baths and Recticare as needed for symptomatic relief.  I went over conservative strategies to help her with constipation and will restart her on Trulance therapy, which has been effective for constipation in the past.  Will also go ahead and restart her on famotidine as needed to help with reflux symptoms.  To make sure that there are no other causes of rectal pain, will plan for a colonoscopy for further evaluation.  She did describe some occasional dysphagia today so we will add on a EGD at the same time as her colonoscopy procedure. - Check direct bilirubin - Continue Trulance PRN - Use famotidine 20 mg BID PRN - Check CT A/P w/contrast. Pending these results, consider  - RTC 2 months   Eulah Pont, MD  I spent 68 minutes of time, including in depth chart review, independent review of results as outlined above, communicating results with the patient directly, face-to-face time with the patient, coordinating care, ordering studies and medications as appropriate, and documentation.

## 2023-02-18 ENCOUNTER — Encounter: Payer: Self-pay | Admitting: Internal Medicine

## 2023-02-19 ENCOUNTER — Other Ambulatory Visit: Payer: Self-pay

## 2023-02-19 ENCOUNTER — Other Ambulatory Visit: Payer: Medicaid Other

## 2023-02-19 DIAGNOSIS — R7989 Other specified abnormal findings of blood chemistry: Secondary | ICD-10-CM

## 2023-02-20 LAB — LACTATE DEHYDROGENASE: LDH: 152 U/L (ref 100–200)

## 2023-02-20 LAB — PATHOLOGIST SMEAR REVIEW

## 2023-02-20 LAB — HAPTOGLOBIN: Haptoglobin: 24 mg/dL — ABNORMAL LOW (ref 43–212)

## 2023-02-21 ENCOUNTER — Ambulatory Visit (HOSPITAL_COMMUNITY)
Admission: RE | Admit: 2023-02-21 | Discharge: 2023-02-21 | Disposition: A | Discharge status: Home / Self Care | Payer: Medicaid Other | Source: Ambulatory Visit | Attending: Internal Medicine | Admitting: Internal Medicine

## 2023-02-21 DIAGNOSIS — R17 Unspecified jaundice: Secondary | ICD-10-CM | POA: Insufficient documentation

## 2023-02-21 DIAGNOSIS — R932 Abnormal findings on diagnostic imaging of liver and biliary tract: Secondary | ICD-10-CM | POA: Diagnosis not present

## 2023-02-21 DIAGNOSIS — K5900 Constipation, unspecified: Secondary | ICD-10-CM | POA: Insufficient documentation

## 2023-02-21 DIAGNOSIS — R109 Unspecified abdominal pain: Secondary | ICD-10-CM | POA: Insufficient documentation

## 2023-02-21 DIAGNOSIS — K219 Gastro-esophageal reflux disease without esophagitis: Secondary | ICD-10-CM | POA: Diagnosis not present

## 2023-02-21 MED ORDER — IOHEXOL 300 MG/ML  SOLN
100.0000 mL | Freq: Once | INTRAMUSCULAR | Status: AC | PRN
  Administered 2023-02-21: 100 mL via INTRAVENOUS

## 2023-03-04 ENCOUNTER — Encounter: Payer: Self-pay | Admitting: Internal Medicine

## 2023-03-13 ENCOUNTER — Other Ambulatory Visit: Payer: Self-pay | Admitting: Internal Medicine

## 2023-03-13 MED ORDER — LISDEXAMFETAMINE DIMESYLATE 30 MG PO CAPS: 30.0000 mg | ORAL_CAPSULE | Freq: Every day | ORAL | 0 refills | Status: DC

## 2023-03-28 ENCOUNTER — Other Ambulatory Visit (HOSPITAL_COMMUNITY)
Admission: RE | Admit: 2023-03-28 | Discharge: 2023-03-28 | Disposition: A | Discharge status: Home / Self Care | Payer: Medicaid Other | Source: Ambulatory Visit | Attending: Obstetrics & Gynecology | Admitting: Obstetrics & Gynecology

## 2023-03-28 ENCOUNTER — Ambulatory Visit (HOSPITAL_BASED_OUTPATIENT_CLINIC_OR_DEPARTMENT_OTHER): Payer: Medicaid Other | Admitting: Obstetrics & Gynecology

## 2023-03-28 ENCOUNTER — Encounter (HOSPITAL_BASED_OUTPATIENT_CLINIC_OR_DEPARTMENT_OTHER): Payer: Self-pay | Admitting: Obstetrics & Gynecology

## 2023-03-28 VITALS — BP 107/68 | HR 85 | Ht 63.75 in | Wt 155.2 lb

## 2023-03-28 DIAGNOSIS — N9089 Other specified noninflammatory disorders of vulva and perineum: Secondary | ICD-10-CM

## 2023-03-28 DIAGNOSIS — Z124 Encounter for screening for malignant neoplasm of cervix: Secondary | ICD-10-CM | POA: Insufficient documentation

## 2023-03-28 DIAGNOSIS — B009 Herpesviral infection, unspecified: Secondary | ICD-10-CM | POA: Diagnosis not present

## 2023-03-28 DIAGNOSIS — R3915 Urgency of urination: Secondary | ICD-10-CM | POA: Diagnosis not present

## 2023-03-28 DIAGNOSIS — Z01419 Encounter for gynecological examination (general) (routine) without abnormal findings: Secondary | ICD-10-CM | POA: Diagnosis not present

## 2023-03-28 DIAGNOSIS — N951 Menopausal and female climacteric states: Secondary | ICD-10-CM | POA: Diagnosis not present

## 2023-03-28 LAB — POCT URINALYSIS DIPSTICK
Bilirubin, UA: NEGATIVE
Blood, UA: NEGATIVE
Glucose, UA: NEGATIVE
Ketones, UA: NEGATIVE
Leukocytes, UA: NEGATIVE
Nitrite, UA: NEGATIVE
Protein, UA: NEGATIVE
Spec Grav, UA: 1.02 (ref 1.010–1.025)
Urobilinogen, UA: 0.2 U/dL
pH, UA: 7.5 (ref 5.0–8.0)

## 2023-03-28 MED ORDER — MEDROXYPROGESTERONE ACETATE 10 MG PO TABS: 10.0000 mg | ORAL_TABLET | Freq: Every day | ORAL | 0 refills | Status: DC

## 2023-03-28 MED ORDER — VALACYCLOVIR HCL 1 G PO TABS: 1000.0000 mg | ORAL_TABLET | Freq: Every day | ORAL | 4 refills | Status: DC

## 2023-03-28 MED ORDER — SULFAMETHOXAZOLE-TRIMETHOPRIM 800-160 MG PO TABS: 1.0000 | ORAL_TABLET | Freq: Two times a day (BID) | ORAL | 0 refills | Status: DC

## 2023-03-28 NOTE — Progress Notes (Addendum)
47 y.o. G31P1021 Married [place] American female here for annual exam.  Still having some menstrual cycles.  Last one was July.  Then started having some hot flashes and night sweats.  This has improved so she is expecting her cycle to occur in the next week or two.     She has an area on her labia that she would like me to look at today.  This is sometimes tender to her and she's tried to look but can't see anything.   Patient's last menstrual period was 12/14/2022.          Sexually active: Yes.    The current method of family planning is none.    Exercising: intermittently Smoker:  no  Health Maintenance: Pap:  03/01/2021 Negative History of abnormal Pap:  no MMG:  01/02/2023 Negative Colonoscopy:  10/29/2022, follow up 10 year (but pt wants to repeat in 5) Screening Labs: done 02/06/2023   reports that she has never smoked. She has never used smokeless tobacco. She reports that she does not currently use alcohol. She reports current drug use. Drug: Marijuana.  Past Medical History:  Diagnosis Date   Anemia    during pregnancy    Chronic headaches    Colon polyps    Complication of anesthesia    crying excessive spitting   Concussion age 75 or 24 no residual   Family history of adverse reaction to anesthesia    ponv   GERD (gastroesophageal reflux disease)    GERD (gastroesophageal reflux disease)    Gilbert's syndrome    HSV-2 infection    2008   Hypertension    IBS (irritable bowel syndrome)    IC (interstitial cystitis)    2008   Migraine    Wears glasses     Past Surgical History:  Procedure Laterality Date   ADENOIDECTOMY  as child   BREAST SURGERY  2002   right breast biopsy Benign   CESAREAN SECTION N/A 08/30/2013   Procedure: CESAREAN SECTION;  Surgeon: Serita Kyle, MD;  Location: [facility];  Service: Obstetrics;  Laterality: N/A;   colonscopy  04/18/2020   and endoscopy done   DILATION AND EVACUATION N/A 06/23/2020   Procedure: DILATATION AND  EVACUATION;  Surgeon: Jerene Bears, MD;  Location: [facility];  Service: Gynecology;  Laterality: N/A;   KNEE ARTHROSCOPY  3/11, 2012   right knee   SHOULDER ARTHROSCOPY Right 2010   TONSILLECTOMY  as child   and adenoids   WISDOM TOOTH EXTRACTION  2008    Current Outpatient Medications  Medication Sig Dispense Refill   azelastine (ASTELIN) 0.1 % nasal spray Place 1-2 sprays into both nostrils 2 (two) times daily as needed (nasal drainage). Use in each nostril as directed 30 mL 5   cetirizine (ZYRTEC ALLERGY) 10 MG tablet May take 10mg  1-2 timer per day for hives and allergies. 60 tablet 5   cromolyn (OPTICROM) 4 % ophthalmic solution Place 1 drop into both eyes 4 (four) times daily as needed (itchy/watery eyes). 10 mL 5   fluticasone (FLONASE) 50 MCG/ACT nasal spray Place 1 spray into both nostrils 2 (two) times daily as needed (nasal congestion). 16 g 5   hydrocortisone 2.5 % cream Apply topically 2 (two) times daily. 30 g 0   hydrocortisone-pramoxine (PROCTOFOAM-HC) rectal foam Place 1 applicator rectally 2 (two) times daily. 10 g 2   indapamide (LOZOL) 1.25 MG tablet Take 1 tablet (1.25 mg total) by mouth daily.  90 tablet 2   lisdexamfetamine (VYVANSE) 30 MG capsule Take 1 capsule (30 mg total) by mouth daily. 30 capsule 0   meloxicam (MOBIC) 15 MG tablet Take 15 mg by mouth daily as needed.     NIFEdipine (PROCARDIA XL/NIFEDICAL XL) 60 MG 24 hr tablet Take 1 tablet (60 mg total) by mouth daily. 90 tablet 2   Olopatadine HCl 0.2 % SOLN Apply 1 drop to eye daily as needed (itchy/watery eyes). 2.5 mL 5   omeprazole (PRILOSEC) 40 MG capsule Take 1 capsule (40 mg total) by mouth daily. 90 capsule 1   potassium chloride SA (KLOR-CON M) 20 MEQ tablet Take 1 tablet (20 mEq total) by mouth daily. 90 tablet 3   Prenatal Vit-Fe Fumarate-FA (PRENATAL MULTIVITAMIN) TABS tablet Take 1 tablet by mouth daily at 12 noon.     tiZANidine (ZANAFLEX) 4 MG tablet Take 4 mg by mouth at  bedtime.     TRULANCE 3 MG TABS Take 1 tablet (3 mg total) by mouth daily. 30 tablet 2   valACYclovir (VALTREX) 1000 MG tablet TAKE ONE TABLET BY MOUTH DAILY 90 tablet 4   famotidine (PEPCID) 20 MG tablet Take 1 tablet (20 mg total) by mouth 2 (two) times daily as needed for heartburn or indigestion. 60 tablet 2   No current facility-administered medications for this visit.    Family History  Problem Relation Age of Onset   Diabetes Mother    Hyperlipidemia Mother    Anemia Mother    Irritable bowel syndrome Mother    Hyperlipidemia Father    Hypertension Father    Heart disease Father    Colon polyps Father    Kidney disease Father    Stroke Sister    Hypertension Sister    Kidney disease Sister    Hypertension Sister    Thyroid disease Sister    Heart disease Brother    Diabetes Maternal Grandmother    Heart failure Maternal Grandmother    Prostate cancer Maternal Grandfather    Diabetes Paternal Grandmother    Stroke Paternal Grandmother    Renal Disease Paternal Grandfather    Heart disease Paternal Grandfather    Stroke Paternal Grandfather    Anemia Maternal Aunt    Prostate cancer Paternal Uncle    Kidney disease Maternal Uncle    Colon cancer Neg Hx    Stomach cancer Neg Hx    Esophageal cancer Neg Hx     ROS: Constitutional: negative Genitourinary:negative  Exam:   BP 107/68 (BP Location: Right Arm, Patient Position: Sitting, Cuff Size: Large)   Pulse 85   Ht 5' 3.75" (1.619 m)   Wt 155 lb 3.2 oz (70.4 kg)   LMP 12/14/2022   BMI 26.85 kg/m   Height: 5' 3.75" (161.9 cm)  General appearance: alert, cooperative and appears stated age Head: Normocephalic, without obvious abnormality, atraumatic Neck: no adenopathy, supple, symmetrical, trachea midline and thyroid normal to inspection and palpation Lungs: clear to auscultation bilaterally Breasts: normal appearance, no masses or tenderness Heart: regular rate and rhythm Abdomen: soft, non-tender; bowel  sounds normal; no masses,  no organomegaly Extremities: extremities normal, atraumatic, no cyanosis or edema Skin: Skin color, texture, turgor normal. No rashes or lesions Lymph nodes: Cervical, supraclavicular, and axillary nodes normal. No abnormal inguinal nodes palpated Neurologic: Grossly normal   Pelvic: External genitalia:  small pigmented lesion on inner right labia majora c/w skin tag              Urethra:  normal appearing urethra with no masses, tenderness or lesions              Bartholins and Skenes: normal                 Vagina: normal appearing vagina with normal color and no discharge, no lesions              Cervix: no lesions              Pap taken: No. Bimanual Exam:  Uterus:  normal size, contour, position, consistency, mobility, non-tender              Adnexa: normal adnexa and no mass, fullness, tenderness               Rectovaginal: Confirms               Anus:  normal sphincter tone, no lesions  Chaperone, Ina Homes, CMA, was present for exam.  Assessment/Plan: 1. Well woman exam with routine gynecological exam - Pap smear obtained per pt request.  Will repeat HR HPV 1 year. - Mammogram 12/2022 - Colonoscopy 10/2022  - Bone mineral density not indicated - lab work done with PCP, Dr. Dorette Grate in early September - vaccines reviewed/updated  2. Cervical cancer screening - Cytology - PAP( [place])  3. Urinary urgency - POCT Urinalysis Dipstick - urine culture pending - rx for bactrim DS bid x 3 days  4. HSV-2 infection - valACYclovir (VALTREX) 1000 MG tablet; Take 1 tablet (1,000 mg total) by mouth daily.  Dispense: 90 tablet; Refill: 4  5. Perimenopausal - if no cycle by end of November, she will take provera  6. Vulvar lesion - discussed removal.  She will let me know if desires.

## 2023-03-29 ENCOUNTER — Ambulatory Visit: Payer: Medicaid Other | Admitting: Internal Medicine

## 2023-03-29 ENCOUNTER — Other Ambulatory Visit (INDEPENDENT_AMBULATORY_CARE_PROVIDER_SITE_OTHER): Payer: Medicaid Other

## 2023-03-29 ENCOUNTER — Encounter: Payer: Self-pay | Admitting: Internal Medicine

## 2023-03-29 VITALS — BP 110/70 | HR 91 | Ht 64.0 in | Wt 155.0 lb

## 2023-03-29 DIAGNOSIS — R5383 Other fatigue: Secondary | ICD-10-CM

## 2023-03-29 DIAGNOSIS — K2970 Gastritis, unspecified, without bleeding: Secondary | ICD-10-CM | POA: Diagnosis not present

## 2023-03-29 DIAGNOSIS — K2990 Gastroduodenitis, unspecified, without bleeding: Secondary | ICD-10-CM

## 2023-03-29 DIAGNOSIS — R109 Unspecified abdominal pain: Secondary | ICD-10-CM | POA: Diagnosis not present

## 2023-03-29 DIAGNOSIS — K219 Gastro-esophageal reflux disease without esophagitis: Secondary | ICD-10-CM

## 2023-03-29 DIAGNOSIS — K5900 Constipation, unspecified: Secondary | ICD-10-CM

## 2023-03-29 LAB — VITAMIN B12: Vitamin B-12: 549 pg/mL (ref 211–911)

## 2023-03-29 LAB — VITAMIN D 25 HYDROXY (VIT D DEFICIENCY, FRACTURES): VITD: 33.6 ng/mL (ref 30.00–100.00)

## 2023-03-29 MED ORDER — OMEPRAZOLE 40 MG PO CPDR: 40.0000 mg | DELAYED_RELEASE_CAPSULE | Freq: Every day | ORAL | 1 refills | Status: DC

## 2023-03-29 NOTE — Progress Notes (Signed)
Chief Complaint: Constipation, abdominal pain  HPI : 47 year old female with history of GERD, IBS, migraine, anemia presents for follow up of constipation and abdominal pain  Interval History: Patient has for the most part been trying to take Trulance daily.  She can't take Trulance if she has appts to go to during the daytime. Last week she was not emptying out fully. Stools were only pebbles last week, and she was straining. Ab pain overall is better since she has been taking the Trulance slightly more frequently. She is hydrating well. She feels like she has very little energy. She is not as physically active as she was in the past due to her fatigue. She is fairly happy with where she is right now in terms of her bowel habits. She had some acid reflux last week. Pepcid sometimes works for the GERD. Endorses regurgitation. Denies dysphagia.  Wt Readings from Last 3 Encounters:  03/29/23 155 lb (70.3 kg)  03/28/23 155 lb 3.2 oz (70.4 kg)  02/15/23 157 lb (71.2 kg)   Past Medical History:  Diagnosis Date   Anemia    during pregnancy    Chronic headaches    Colon polyps    Complication of anesthesia    crying excessive spitting   Concussion age 21 or 34 no residual   Family history of adverse reaction to anesthesia    ponv   GERD (gastroesophageal reflux disease)    GERD (gastroesophageal reflux disease)    Gilbert's syndrome    HSV-2 infection    2008   Hypertension    IBS (irritable bowel syndrome)    IC (interstitial cystitis)    2008   Migraine    Wears glasses      Past Surgical History:  Procedure Laterality Date   ADENOIDECTOMY  as child   BREAST SURGERY  2002   right breast biopsy Benign   CESAREAN SECTION N/A 08/30/2013   Procedure: CESAREAN SECTION;  Surgeon: Serita Kyle, MD;  Location: [facility];  Service: Obstetrics;  Laterality: N/A;   colonscopy  04/18/2020   and endoscopy done   DILATION AND EVACUATION N/A 06/23/2020   Procedure: DILATATION AND  EVACUATION;  Surgeon: Jerene Bears, MD;  Location: [facility];  Service: Gynecology;  Laterality: N/A;   KNEE ARTHROSCOPY  3/11, 2012   right knee   SHOULDER ARTHROSCOPY Right 2010   TONSILLECTOMY  as child   and adenoids   WISDOM TOOTH EXTRACTION  2008   Family History  Problem Relation Age of Onset   Diabetes Mother    Hyperlipidemia Mother    Anemia Mother    Irritable bowel syndrome Mother    Hyperlipidemia Father    Hypertension Father    Heart disease Father    Colon polyps Father    Kidney disease Father    Stroke Sister    Hypertension Sister    Kidney disease Sister    Hypertension Sister    Thyroid disease Sister    Heart disease Brother    Diabetes Maternal Grandmother    Heart failure Maternal Grandmother    Prostate cancer Maternal Grandfather    Diabetes Paternal Grandmother    Stroke Paternal Grandmother    Renal Disease Paternal Grandfather    Heart disease Paternal Grandfather    Stroke Paternal Grandfather    Anemia Maternal Aunt    Prostate cancer Paternal Uncle    Kidney disease Maternal Uncle    Colon cancer Neg Hx  Stomach cancer Neg Hx    Esophageal cancer Neg Hx    Social History   Tobacco Use   Smoking status: Never   Smokeless tobacco: Never  Vaping Use   Vaping status: Never Used  Substance Use Topics   Alcohol use: Not Currently   Drug use: Yes    Types: Marijuana    Comment: marijuana last use sept 2021   Current Outpatient Medications  Medication Sig Dispense Refill   azelastine (ASTELIN) 0.1 % nasal spray Place 1-2 sprays into both nostrils 2 (two) times daily as needed (nasal drainage). Use in each nostril as directed 30 mL 5   indapamide (LOZOL) 1.25 MG tablet Take 1 tablet (1.25 mg total) by mouth daily. 90 tablet 2   lisdexamfetamine (VYVANSE) 30 MG capsule Take 1 capsule (30 mg total) by mouth daily. 30 capsule 0   medroxyPROGESTERone (PROVERA) 10 MG tablet Take 1 tablet (10 mg total) by mouth daily. 10  tablet 0   meloxicam (MOBIC) 15 MG tablet Take 15 mg by mouth daily as needed.     NIFEdipine (PROCARDIA XL/NIFEDICAL XL) 60 MG 24 hr tablet Take 1 tablet (60 mg total) by mouth daily. 90 tablet 2   omeprazole (PRILOSEC) 40 MG capsule Take 1 capsule (40 mg total) by mouth daily. 90 capsule 1   potassium chloride SA (KLOR-CON M) 20 MEQ tablet Take 1 tablet (20 mEq total) by mouth daily. 90 tablet 3   Prenatal Vit-Fe Fumarate-FA (PRENATAL MULTIVITAMIN) TABS tablet Take 1 tablet by mouth daily at 12 noon.     sulfamethoxazole-trimethoprim (BACTRIM DS) 800-160 MG tablet Take 1 tablet by mouth 2 (two) times daily. 6 tablet 0   tiZANidine (ZANAFLEX) 4 MG tablet Take 4 mg by mouth at bedtime.     TRULANCE 3 MG TABS Take 1 tablet (3 mg total) by mouth daily. 30 tablet 2   valACYclovir (VALTREX) 1000 MG tablet Take 1 tablet (1,000 mg total) by mouth daily. 90 tablet 4   cetirizine (ZYRTEC ALLERGY) 10 MG tablet May take 10mg  1-2 timer per day for hives and allergies. (Patient not taking: Reported on 03/29/2023) 60 tablet 5   cromolyn (OPTICROM) 4 % ophthalmic solution Place 1 drop into both eyes 4 (four) times daily as needed (itchy/watery eyes). (Patient not taking: Reported on 03/29/2023) 10 mL 5   famotidine (PEPCID) 20 MG tablet Take 1 tablet (20 mg total) by mouth 2 (two) times daily as needed for heartburn or indigestion. 60 tablet 2   fluticasone (FLONASE) 50 MCG/ACT nasal spray Place 1 spray into both nostrils 2 (two) times daily as needed (nasal congestion). (Patient not taking: Reported on 03/29/2023) 16 g 5   hydrocortisone 2.5 % cream Apply topically 2 (two) times daily. (Patient not taking: Reported on 03/29/2023) 30 g 0   hydrocortisone-pramoxine (PROCTOFOAM-HC) rectal foam Place 1 applicator rectally 2 (two) times daily. (Patient not taking: Reported on 03/29/2023) 10 g 2   Olopatadine HCl 0.2 % SOLN Apply 1 drop to eye daily as needed (itchy/watery eyes). (Patient not taking: Reported on 03/29/2023)  2.5 mL 5   No current facility-administered medications for this visit.   Allergies  Allergen Reactions   Ace Inhibitors     Swelling og tongue and lips   Adhesive [Tape] Rash   Augmentin [Amoxicillin-Pot Clavulanate] Rash    Rash on lower legs   Latex Itching and Rash    Rash and itching from use of gloves   Physical Exam: BP 110/70  Pulse 91   Ht 5\' 4"  (1.626 m)   Wt 155 lb (70.3 kg)   LMP 12/14/2022   BMI 26.61 kg/m  Constitutional: Pleasant,well-developed, female in no acute distress. HEENT: Normocephalic and atraumatic. Conjunctivae are normal. No scleral icterus. Cardiovascular: Normal rate, regular rhythm.  Pulmonary/chest: Effort normal and breath sounds normal. No wheezing, rales or rhonchi. Abdominal: Soft, nondistended, non-tender. Bowel sounds active throughout. There are no masses palpable. No hepatomegaly. Extremities: No edema Neurological: Alert and oriented to person place and time. Skin: Skin is warm and dry. No rashes noted. Psychiatric: Normal mood and affect. Behavior is normal.  Labs 02/2022 : Lipase nml.   Labs 07/2022: CBC unremarkable. CMP nml. TSH nml. HbA1C 5.6%  Labs 01/2023: CBC nml. Ferritin nml. TSH nml. CMP with elevated total bilirubin of 1.4.  Direct bilirubin was normal at 0.2.  LDH normal.  Haptoglobin slightly low at 24.  Pathology smear was fairly normal.  CTA C/A/P w/contrast 03/16/22: IMPRESSION: 1. No acute intrathoracic pathology. No aortic aneurysm or dissection. 2. Findings suspicious for right pyelonephritis. Correlation with urinalysis recommended. No drainable fluid collection or abscess.  CT A/P w/contrast 02/21/23: IMPRESSION: No acute findings or other significant abnormality  EGD 10/29/22: - Normal esophagus. Biopsied.  - Gastritis. Biopsied.  - Normal examined duodenum. Path: 1. 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 H pylori was negative NEGATIVE FOR INTESTINAL  METAPLASIA OR DYSPLASIA. 2. Surgical [P], esophagus ESOPHAGEAL SQUAMOUS MUCOSA WITH NO SIGNIFICANT DIAGNOSTIC ALTERATION. NO EVIDENCE OF INTRAEPITHELIAL EOSINOPHILS OR LYMPHOCYTES.  Colonoscopy 10/29/22: - Non- bleeding internal hemorrhoids.  - The examination was otherwise normal.  - No specimens collected.  ASSESSMENT AND PLAN: Constipation Fatigue Ab pain - improved GERD Hemorrhoids Gilbert's syndrome Patient has overall had improvement in her abdominal pain with further treatment of her constipation.  She has been trying to take her Trulance almost every day, though she is sometimes limited by her daily activities.  Overall she is fairly content with her bowel habits currently.  Could consider adding a PRN laxative in the future if patient's constipation worsens.  Patient's acid reflux has recently been less well-controlled.  Will start her on omeprazole daily to see if this is better than the Pepcid in terms of helping with her acid reflux issues.  Prior workup of her elevated bilirubin revealed that she likely has Gilbert's disease. - Check vitamin B12 and vit D - Continue daily fiber supplement - Continue Trulance every day - Try to increase physical activity - Start omeprazole 40 mg every day - RTC 3 months   Denise Pont, MD  I spent 36 minutes of time, including in depth chart review, independent review of results as outlined above, communicating results with the patient directly, face-to-face time with the patient, coordinating care, ordering studies and medications as appropriate, and documentation.

## 2023-03-29 NOTE — Patient Instructions (Addendum)
Your provider has requested that you go to the basement level for lab work before leaving today. Press "B" on the elevator. The lab is located at the first door on the left as you exit the elevator.  We have sent the following medications to your pharmacy for you to pick up at your convenience: Omeprazole  You nare scheduled for a follow up visit on 07/24/23 at 1:50 pm  _______________________________________________________  If your blood pressure at your visit was 140/90 or greater, please contact your primary care physician to follow up on this.  _______________________________________________________  If you are age 45 or older, your body mass index should be between 23-30. Your Body mass index is 26.61 kg/m. If this is out of the aforementioned range listed, please consider follow up with your Primary Care Provider.  If you are age 45 or younger, your body mass index should be between 19-25. Your Body mass index is 26.61 kg/m. If this is out of the aformentioned range listed, please consider follow up with your Primary Care Provider.   ________________________________________________________  The [place] [facility] providers would like to encourage you to use [facility] to communicate with providers for non-urgent requests or questions.  Due to long hold times on the telephone, sending your provider a message by [facility] may be a faster and more efficient way to get a response.  Please allow 48 business hours for a response.  Please remember that this is for non-urgent requests.  _______________________________________________________  Due to recent changes in healthcare laws, you may see the results of your imaging and laboratory studies on MyChart before your provider has had a chance to review them.  We understand that in some cases there may be results that are confusing or concerning to you. Not all laboratory results come back in the same time frame and the provider may be waiting for multiple  results in order to interpret others.  Please give [place] 48 hours in order for your provider to thoroughly review all the results before contacting the office for clarification of your results.   Thank you for entrusting me with your care and for choosing [facility], Dr. Eulah Pont

## 2023-03-30 LAB — URINE CULTURE

## 2023-04-02 NOTE — Progress Notes (Unsigned)
Cardiology Office Note:  .   Date:  04/04/2023  ID:  Denise [name], DOB Sep 19, 1975, MRN [id number] PCP: Pincus Sanes, MD  [facility] Providers Cardiologist:  Reatha Harps, MD    History of Present Illness: .   Denise [name] is a 47 y.o. female with history of GERD who presents for the evaluation of palpitations at the request of Burns, Stacy J, MD.  History of Present Illness   [name], a 47 year old with a history of acid reflux and hypertension, presents with infrequent episodes of palpitations over the past two years. These episodes are described as a fluttering sensation in the chest and neck, causing her to cough and lose her breath. She also experiences a pulsating sensation in her neck when she sleeps. The episodes last for about an hour and occur every few months. She has not identified any specific triggers for these episodes. During these episodes, she experiences headaches and finds relief after taking a baby aspirin. She also reports a history of hypertension, which was triggered by her pregnancy and has persisted despite the birth of her child. She is currently on nifedipine and indapamide for blood pressure control. Symptoms of palpitations for 2 years. Infrequent. Some chest and neck discomfort.       T chol 251, HDL 87, LDL 153, TG 56 HGB 14.3 TSH 2.13 CT Chest 03/16/2022 -> No coronary calcium      Problem List GERD Gilbert's syndrome  HTN    ROS: All other ROS reviewed and negative. Pertinent positives noted in the HPI.     Studies Reviewed: Marland Kitchen   EKG Interpretation Date/Time:  Thursday April 04 2023 09:57:13 EST Ventricular Rate:  98 PR Interval:  146 QRS Duration:  82 QT Interval:  340 QTC Calculation: 434 R Axis:   42  Text Interpretation: Normal sinus rhythm Normal ECG Confirmed by Lennie Odor [phone]) on 04/04/2023 9:58:45 AM   Physical Exam:   VS:  BP 116/68   Pulse 98   Ht 5' 3.75" (1.619 m)   Wt 155 lb 9.6 oz (70.6 kg)   LMP  12/14/2022   SpO2 99%   BMI 26.92 kg/m    Wt Readings from Last 3 Encounters:  04/04/23 155 lb 9.6 oz (70.6 kg)  03/29/23 155 lb (70.3 kg)  03/28/23 155 lb 3.2 oz (70.4 kg)    GEN: Well nourished, well developed in no acute distress NECK: No JVD; No carotid bruits CARDIAC: RRR, no murmurs, rubs, gallops RESPIRATORY:  Clear to auscultation without rales, wheezing or rhonchi  ABDOMEN: Soft, non-tender, non-distended EXTREMITIES:  No edema; No deformity  ASSESSMENT AND PLAN: .   Assessment and Plan    Palpitations Chest pain Infrequent episodes of palpitations and neck discomfort over the past two years. EKG normal. CT scan from October 2023 showed no coronary calcium. Lipids within normal range. Family history of heart disease. -Order echocardiogram for baseline due to family history. -Consider upgrading to an Apple Watch with EKG capability to monitor infrequent episodes. -Return as needed based on testing results.  Hypertension Well-controlled hypertension, with blood pressure on the lower side. Currently on Nifedipine and Indapamide. -Primary care physician to consider adjusting medications if necessary.              Follow-up: Return if symptoms worsen or fail to improve.   Signed, Lenna Gilford. Flora Lipps, MD, [facility]  [facility]  [address] [place], [place] [address] [phone]  10:50 AM

## 2023-04-03 LAB — CYTOLOGY - PAP: Diagnosis: NEGATIVE

## 2023-04-04 ENCOUNTER — Ambulatory Visit: Payer: Medicaid Other | Attending: Cardiovascular Disease | Admitting: Cardiovascular Disease

## 2023-04-04 ENCOUNTER — Encounter: Payer: Self-pay | Admitting: Cardiovascular Disease

## 2023-04-04 VITALS — BP 116/68 | HR 98 | Ht 63.75 in | Wt 155.6 lb

## 2023-04-04 DIAGNOSIS — R072 Precordial pain: Secondary | ICD-10-CM

## 2023-04-04 DIAGNOSIS — R002 Palpitations: Secondary | ICD-10-CM | POA: Diagnosis not present

## 2023-04-04 NOTE — Patient Instructions (Signed)
Medication Instructions:   Your physician recommends that you continue on your current medications as directed. Please refer to the Current Medication list given to you today.   *If you need a refill on your cardiac medications before your next appointment, please call your pharmacy*   Lab Work: NONE    If you have labs (blood work) drawn today and your tests are completely normal, you will receive your results only by: MyChart Message (if you have MyChart) OR A paper copy in the mail If you have any lab test that is abnormal or we need to change your treatment, we will call you to review the results.   Testing/Procedures: Echo will be scheduled at [address].  Your physician has requested that you have an echocardiogram. Echocardiography is a painless test that uses sound waves to create images of your heart. It provides your doctor with information about the size and shape of your heart and how well your heart's chambers and valves are working. This procedure takes approximately one hour. There are no restrictions for this procedure. Please do NOT wear cologne, perfume, aftershave, or lotions (deodorant is allowed). Please arrive 15 minutes prior to your appointment time.    Follow-Up: At [facility], you and your health needs are our priority.  As part of our continuing mission to provide you with exceptional heart care, we have created designated Provider Care Teams.  These Care Teams include your primary Cardiologist (physician) and Advanced Practice Providers (APPs -  Physician Assistants and Nurse Practitioners) who all work together to provide you with the care you need, when you need it.  We recommend signing up for the patient portal called "MyChart".  Sign up information is provided on this After Visit Summary.  MyChart is used to connect with patients for Virtual Visits (Telemedicine).  Patients are able to view lab/test results, encounter notes, upcoming  appointments, etc.  Non-urgent messages can be sent to your provider as well.   To learn more about what you can do with MyChart, go to [email or web address].    Your next appointment:   Follow up as needed   Provider:   Reatha Harps, MD    Other Instructions

## 2023-04-05 ENCOUNTER — Encounter (HOSPITAL_BASED_OUTPATIENT_CLINIC_OR_DEPARTMENT_OTHER): Payer: Self-pay | Admitting: Obstetrics & Gynecology

## 2023-04-05 ENCOUNTER — Ambulatory Visit (HOSPITAL_COMMUNITY): Payer: Medicaid Other | Attending: Cardiovascular Disease

## 2023-04-05 DIAGNOSIS — R002 Palpitations: Secondary | ICD-10-CM | POA: Insufficient documentation

## 2023-04-05 DIAGNOSIS — R072 Precordial pain: Secondary | ICD-10-CM | POA: Diagnosis not present

## 2023-04-05 LAB — ECHOCARDIOGRAM COMPLETE
Area-P 1/2: 3.01 cm2
S' Lateral: 2.4 cm

## 2023-04-11 ENCOUNTER — Encounter (HOSPITAL_BASED_OUTPATIENT_CLINIC_OR_DEPARTMENT_OTHER): Payer: Self-pay | Admitting: Obstetrics & Gynecology

## 2023-04-11 ENCOUNTER — Other Ambulatory Visit: Payer: Self-pay | Admitting: Internal Medicine

## 2023-04-11 MED ORDER — LISDEXAMFETAMINE DIMESYLATE 30 MG PO CAPS: 30.0000 mg | ORAL_CAPSULE | Freq: Every day | ORAL | 0 refills | Status: DC

## 2023-05-09 ENCOUNTER — Encounter: Payer: Self-pay | Admitting: Internal Medicine

## 2023-05-09 ENCOUNTER — Ambulatory Visit: Payer: Medicaid Other | Admitting: Internal Medicine

## 2023-05-09 VITALS — BP 116/82 | HR 70 | Temp 97.9°F | Ht 63.75 in | Wt 152.0 lb

## 2023-05-09 DIAGNOSIS — J069 Acute upper respiratory infection, unspecified: Secondary | ICD-10-CM

## 2023-05-09 DIAGNOSIS — I10 Essential (primary) hypertension: Secondary | ICD-10-CM

## 2023-05-09 MED ORDER — HYDROCODONE BIT-HOMATROP MBR 5-1.5 MG/5ML PO SOLN: 5.0000 mL | Freq: Three times a day (TID) | ORAL | 0 refills | Status: DC | PRN

## 2023-05-09 NOTE — Assessment & Plan Note (Signed)
Acute Symptoms likely viral in nature Rapid COVID and flu is negative Hycodan cough syrup as needed Continue symptomatic treatment with over-the-counter cold medications, Tylenol/ibuprofen Increase rest and fluids Call if symptoms worsen or do not improve

## 2023-05-09 NOTE — Patient Instructions (Addendum)
       Medications changes include :   hycodan cough syrup   Continue zyrtec.  Start corcidin cold products    Increase water intake    Return if symptoms worsen or fail to improve.

## 2023-05-09 NOTE — Progress Notes (Signed)
Subjective:    Patient ID: Denise [name], female    DOB: 19-Jul-1975, 47 y.o.   MRN: [id number]      HPI Yahir is here for  Chief Complaint  Patient presents with   Cough    Symptoms started on Monday: cough, nasal drainage, sore throat and headache    She is here for an acute visit for cold symptoms.   Her symptoms started 3 days ago - today is day 4  She is experiencing fatigue, chills, ear pain, runny nose, sinus pressure, sore throat, hoarseness, cough-has a lot of thick mucus as she cannot bring up, 1 day of diarrhea, headaches and some lightheadedness.  She denies fevers, shortness of breath or wheezing.  She has tried taking  zyrtec   Covid and flu tests here today are negative   Medications and allergies reviewed with patient and updated if appropriate.  Current Outpatient Medications on File Prior to Visit  Medication Sig Dispense Refill   azelastine (ASTELIN) 0.1 % nasal spray Place 1-2 sprays into both nostrils 2 (two) times daily as needed (nasal drainage). Use in each nostril as directed 30 mL 5   cetirizine (ZYRTEC ALLERGY) 10 MG tablet May take 10mg  1-2 timer per day for hives and allergies. 60 tablet 5   cromolyn (OPTICROM) 4 % ophthalmic solution Place 1 drop into both eyes 4 (four) times daily as needed (itchy/watery eyes). 10 mL 5   fluticasone (FLONASE) 50 MCG/ACT nasal spray Place 1 spray into both nostrils 2 (two) times daily as needed (nasal congestion). 16 g 5   hydrocortisone 2.5 % cream Apply topically 2 (two) times daily. 30 g 0   hydrocortisone-pramoxine (PROCTOFOAM-HC) rectal foam Place 1 applicator rectally 2 (two) times daily. 10 g 2   indapamide (LOZOL) 1.25 MG tablet Take 1 tablet (1.25 mg total) by mouth daily. 90 tablet 2   lisdexamfetamine (VYVANSE) 30 MG capsule Take 1 capsule (30 mg total) by mouth daily. 30 capsule 0   medroxyPROGESTERone (PROVERA) 10 MG tablet Take 1 tablet (10 mg total) by mouth daily. 10 tablet 0   meloxicam  (MOBIC) 15 MG tablet Take 15 mg by mouth daily as needed.     NIFEdipine (PROCARDIA XL/NIFEDICAL XL) 60 MG 24 hr tablet Take 1 tablet (60 mg total) by mouth daily. 90 tablet 2   Olopatadine HCl 0.2 % SOLN Apply 1 drop to eye daily as needed (itchy/watery eyes). 2.5 mL 5   omeprazole (PRILOSEC) 40 MG capsule Take 1 capsule (40 mg total) by mouth daily. 90 capsule 1   potassium chloride SA (KLOR-CON M) 20 MEQ tablet Take 1 tablet (20 mEq total) by mouth daily. 90 tablet 3   Prenatal Vit-Fe Fumarate-FA (PRENATAL MULTIVITAMIN) TABS tablet Take 1 tablet by mouth daily at 12 noon.     sulfamethoxazole-trimethoprim (BACTRIM DS) 800-160 MG tablet Take 1 tablet by mouth 2 (two) times daily. 6 tablet 0   tiZANidine (ZANAFLEX) 4 MG tablet Take 4 mg by mouth at bedtime.     TRULANCE 3 MG TABS Take 1 tablet (3 mg total) by mouth daily. 30 tablet 2   valACYclovir (VALTREX) 1000 MG tablet Take 1 tablet (1,000 mg total) by mouth daily. 90 tablet 4   famotidine (PEPCID) 20 MG tablet Take 1 tablet (20 mg total) by mouth 2 (two) times daily as needed for heartburn or indigestion. 60 tablet 2   No current facility-administered medications on file prior to visit.    Review of Systems  Constitutional:  Positive for chills and fatigue. Negative for fever.  HENT:  Positive for ear pain (occ), rhinorrhea, sinus pressure, sore throat and voice change. Negative for congestion.        Neck glands swollen and sore  Respiratory:  Positive for cough (thick mucus). Negative for shortness of breath and wheezing.   Gastrointestinal:  Positive for diarrhea (a little 3 days ago). Negative for nausea.  Musculoskeletal:  Positive for myalgias (a little).  Neurological:  Positive for light-headedness (mild) and headaches. Negative for dizziness.       Objective:   Vitals:   05/09/23 0838  BP: 116/82  Pulse: 70  Temp: 97.9 F (36.6 C)  SpO2: 97%   BP Readings from Last 3 Encounters:  05/09/23 116/82  04/04/23 116/68   03/29/23 110/70   Wt Readings from Last 3 Encounters:  05/09/23 152 lb (68.9 kg)  04/04/23 155 lb 9.6 oz (70.6 kg)  03/29/23 155 lb (70.3 kg)   Body mass index is 26.3 kg/m.    Physical Exam Constitutional:      General: She is not in acute distress.    Appearance: Normal appearance. She is not ill-appearing.  HENT:     Head: Normocephalic and atraumatic.     Right Ear: Tympanic membrane, ear canal and external ear normal.     Left Ear: Tympanic membrane, ear canal and external ear normal.     Mouth/Throat:     Mouth: Mucous membranes are moist.     Pharynx: No oropharyngeal exudate or posterior oropharyngeal erythema.  Eyes:     Conjunctiva/sclera: Conjunctivae normal.  Cardiovascular:     Rate and Rhythm: Normal rate and regular rhythm.  Pulmonary:     Effort: Pulmonary effort is normal. No respiratory distress.     Breath sounds: Normal breath sounds. No wheezing or rales.  Musculoskeletal:     Cervical back: Neck supple. No tenderness.  Lymphadenopathy:     Cervical: No cervical adenopathy.  Skin:    General: Skin is warm and dry.  Neurological:     Mental Status: She is alert.            Assessment & Plan:    See Problem List for Assessment and Plan of chronic medical problems.

## 2023-05-09 NOTE — Assessment & Plan Note (Signed)
Chronic Blood pressure well controlled She wondered if she could decrease some of her medications since she has done well with weight loss and has kept off her weight Advised monitoring her BP regularly for a couple of weeks and then we can consider adjusting her medication and having her monitor it further For now no change in medication Continue indapamide 1.25 mg daily, nifedipine XL 60 mg daily

## 2023-05-12 ENCOUNTER — Other Ambulatory Visit: Payer: Self-pay | Admitting: Internal Medicine

## 2023-05-12 ENCOUNTER — Encounter: Payer: Self-pay | Admitting: Internal Medicine

## 2023-05-13 MED ORDER — LISDEXAMFETAMINE DIMESYLATE 30 MG PO CAPS: 30.0000 mg | ORAL_CAPSULE | Freq: Every day | ORAL | 0 refills | Status: DC

## 2023-05-13 MED ORDER — AZITHROMYCIN 250 MG PO TABS: ORAL_TABLET | ORAL | 0 refills | Status: DC

## 2023-05-13 MED ORDER — PROMETHAZINE-DM 6.25-15 MG/5ML PO SYRP: 5.0000 mL | ORAL_SOLUTION | Freq: Four times a day (QID) | ORAL | 0 refills | Status: DC | PRN

## 2023-06-07 ENCOUNTER — Ambulatory Visit: Payer: Medicaid Other | Admitting: Internal Medicine

## 2023-06-12 ENCOUNTER — Other Ambulatory Visit: Payer: Self-pay | Admitting: Internal Medicine

## 2023-06-12 MED ORDER — LISDEXAMFETAMINE DIMESYLATE 30 MG PO CAPS: 30.0000 mg | ORAL_CAPSULE | Freq: Every day | ORAL | 0 refills | Status: DC

## 2023-07-11 ENCOUNTER — Other Ambulatory Visit: Payer: Self-pay | Admitting: Internal Medicine

## 2023-07-11 MED ORDER — LISDEXAMFETAMINE DIMESYLATE 30 MG PO CAPS: 30.0000 mg | ORAL_CAPSULE | Freq: Every day | ORAL | 0 refills | Status: DC

## 2023-07-18 ENCOUNTER — Encounter: Payer: Self-pay | Admitting: Internal Medicine

## 2023-07-24 ENCOUNTER — Encounter (HOSPITAL_BASED_OUTPATIENT_CLINIC_OR_DEPARTMENT_OTHER): Payer: Self-pay | Admitting: Obstetrics & Gynecology

## 2023-07-24 ENCOUNTER — Ambulatory Visit: Payer: Medicaid Other | Admitting: Internal Medicine

## 2023-07-24 ENCOUNTER — Encounter: Payer: Self-pay | Admitting: Internal Medicine

## 2023-07-24 VITALS — BP 104/70 | HR 78 | Ht 63.75 in | Wt 148.0 lb

## 2023-07-24 DIAGNOSIS — K219 Gastro-esophageal reflux disease without esophagitis: Secondary | ICD-10-CM

## 2023-07-24 DIAGNOSIS — K5900 Constipation, unspecified: Secondary | ICD-10-CM

## 2023-07-24 MED ORDER — PRUCALOPRIDE SUCCINATE 2 MG PO TABS
2.0000 mg | ORAL_TABLET | Freq: Every day | ORAL | 3 refills | Status: DC
Start: 2023-07-24 — End: 2023-10-14

## 2023-07-24 NOTE — Patient Instructions (Addendum)
 Try eating 2 kiwis per day  We have sent the following medications to your pharmacy for you to pick up at your convenience:  Motegrity  _______________________________________________________  If your blood pressure at your visit was 140/90 or greater, please contact your primary care physician to follow up on this.  _______________________________________________________  If you are age 46 or older, your body mass index should be between 23-30. Your Body mass index is 25.6 kg/m. If this is out of the aforementioned range listed, please consider follow up with your Primary Care Provider.  If you are age 62 or younger, your body mass index should be between 19-25. Your Body mass index is 25.6 kg/m. If this is out of the aformentioned range listed, please consider follow up with your Primary Care Provider.   ________________________________________________________  The [facility] providers would like to encourage you to use [facility] to communicate with providers for non-urgent requests or questions.  Due to long hold times on the telephone, sending your provider a message by [facility] may be a faster and more efficient way to get a response.  Please allow 48 business hours for a response.  Please remember that this is for non-urgent requests.  _______________________________________________________  Thank you for entrusting me with your care and for choosing [facility], Dr. Eulah Pont

## 2023-07-24 NOTE — Progress Notes (Unsigned)
 Chief Complaint: Constipation, abdominal pain  HPI : 48 year old female with history of GERD, IBS, migraine, anemia presents for follow up of constipation and abdominal pain  Interval History: She has felt like she has not been emptying out fully for the last few weeks. She has had some pain in her left side. She has the urge all day to go to the bathroom but nothing happens. She is taking the Trulance, which does work on occasion. She just restarted her fiber supplement. She has had pelvic floor physical therapy in the past, though this was primarily for bladder issues. Her acid reflux is doing really well. She has not had to take the omeprazole  Wt Readings from Last 3 Encounters:  07/24/23 148 lb (67.1 kg)  05/09/23 152 lb (68.9 kg)  04/04/23 155 lb 9.6 oz (70.6 kg)   Past Medical History:  Diagnosis Date   Anemia    during pregnancy    Chronic headaches    Colon polyps    Complication of anesthesia    crying excessive spitting   Concussion age 46 or 89 no residual   Family history of adverse reaction to anesthesia    ponv   GERD (gastroesophageal reflux disease)    GERD (gastroesophageal reflux disease)    Gilbert's syndrome    HSV-2 infection    2008   Hypertension    IBS (irritable bowel syndrome)    IC (interstitial cystitis)    2008   Migraine    Wears glasses      Past Surgical History:  Procedure Laterality Date   ADENOIDECTOMY  as child   BREAST SURGERY  2002   right breast biopsy Benign   CESAREAN SECTION N/A 08/30/2013   Procedure: CESAREAN SECTION;  Surgeon: Serita Kyle, MD;  Location: [facility];  Service: Obstetrics;  Laterality: N/A;   colonscopy  04/18/2020   and endoscopy done   DILATION AND EVACUATION N/A 06/23/2020   Procedure: DILATATION AND EVACUATION;  Surgeon: Jerene Bears, MD;  Location: [facility];  Service: Gynecology;  Laterality: N/A;   KNEE ARTHROSCOPY  3/11, 2012   right knee   SHOULDER ARTHROSCOPY Right 2010    TONSILLECTOMY  as child   and adenoids   WISDOM TOOTH EXTRACTION  2008   Family History  Problem Relation Age of Onset   Diabetes Mother    Hyperlipidemia Mother    Anemia Mother    Irritable bowel syndrome Mother    Hyperlipidemia Father    Hypertension Father    Heart disease Father    Colon polyps Father    Kidney disease Father    Stroke Sister    Hypertension Sister    Kidney disease Sister    Hypertension Sister    Thyroid disease Sister    Heart disease Brother    Diabetes Maternal Grandmother    Heart failure Maternal Grandmother    Prostate cancer Maternal Grandfather    Diabetes Paternal Grandmother    Stroke Paternal Grandmother    Renal Disease Paternal Grandfather    Heart disease Paternal Grandfather    Stroke Paternal Grandfather    Anemia Maternal Aunt    Prostate cancer Paternal Uncle    Kidney disease Maternal Uncle    Colon cancer Neg Hx    Stomach cancer Neg Hx    Esophageal cancer Neg Hx    Social History   Tobacco Use   Smoking status: Never   Smokeless tobacco: Never  Vaping  Use   Vaping status: Never Used  Substance Use Topics   Alcohol use: Not Currently   Drug use: Never    Comment: marijuana last use sept 2021   Current Outpatient Medications  Medication Sig Dispense Refill   azelastine (ASTELIN) 0.1 % nasal spray Place 1-2 sprays into both nostrils 2 (two) times daily as needed (nasal drainage). Use in each nostril as directed 30 mL 5   azithromycin (ZITHROMAX) 250 MG tablet Take two tabs the first day and then one tab daily for four days 6 tablet 0   cetirizine (ZYRTEC ALLERGY) 10 MG tablet May take 10mg  1-2 timer per day for hives and allergies. 60 tablet 5   cromolyn (OPTICROM) 4 % ophthalmic solution Place 1 drop into both eyes 4 (four) times daily as needed (itchy/watery eyes). 10 mL 5   fluticasone (FLONASE) 50 MCG/ACT nasal spray Place 1 spray into both nostrils 2 (two) times daily as needed (nasal congestion). 16 g 5    hydrocortisone 2.5 % cream Apply topically 2 (two) times daily. 30 g 0   hydrocortisone-pramoxine (PROCTOFOAM-HC) rectal foam Place 1 applicator rectally 2 (two) times daily. 10 g 2   indapamide (LOZOL) 1.25 MG tablet Take 1 tablet (1.25 mg total) by mouth daily. 90 tablet 2   lisdexamfetamine (VYVANSE) 30 MG capsule Take 1 capsule (30 mg total) by mouth daily. 30 capsule 0   NIFEdipine (PROCARDIA XL/NIFEDICAL XL) 60 MG 24 hr tablet Take 1 tablet (60 mg total) by mouth daily. 90 tablet 2   Olopatadine HCl 0.2 % SOLN Apply 1 drop to eye daily as needed (itchy/watery eyes). 2.5 mL 5   omeprazole (PRILOSEC) 40 MG capsule Take 1 capsule (40 mg total) by mouth daily. 90 capsule 1   potassium chloride SA (KLOR-CON M) 20 MEQ tablet Take 1 tablet (20 mEq total) by mouth daily. 90 tablet 3   Prenatal Vit-Fe Fumarate-FA (PRENATAL MULTIVITAMIN) TABS tablet Take 1 tablet by mouth daily at 12 noon.     promethazine-dextromethorphan (PROMETHAZINE-DM) 6.25-15 MG/5ML syrup Take 5 mLs by mouth 4 (four) times daily as needed. 150 mL 0   tiZANidine (ZANAFLEX) 4 MG tablet Take 4 mg by mouth at bedtime.     TRULANCE 3 MG TABS TAKE 1 TABLET BY MOUTH DAILY 30 tablet 2   valACYclovir (VALTREX) 1000 MG tablet Take 1 tablet (1,000 mg total) by mouth daily. 90 tablet 4   famotidine (PEPCID) 20 MG tablet Take 1 tablet (20 mg total) by mouth 2 (two) times daily as needed for heartburn or indigestion. 60 tablet 2   medroxyPROGESTERone (PROVERA) 10 MG tablet Take 1 tablet (10 mg total) by mouth daily. (Patient not taking: Reported on 07/24/2023) 10 tablet 0   No current facility-administered medications for this visit.   Allergies  Allergen Reactions   Ace Inhibitors     Swelling og tongue and lips   Adhesive [Tape] Rash   Augmentin [Amoxicillin-Pot Clavulanate] Rash    Rash on lower legs   Latex Itching and Rash    Rash and itching from use of gloves   Physical Exam: BP 104/70   Pulse 78   Ht 5' 3.75" (1.619 m)    Wt 148 lb (67.1 kg)   BMI 25.60 kg/m  Constitutional: Pleasant,well-developed, female in no acute distress. HEENT: Normocephalic and atraumatic. Conjunctivae are normal. No scleral icterus. Cardiovascular: Normal rate Pulmonary/chest: Effort normal Abdominal: Soft, nondistended, non-tender.  Extremities: No edema Neurological: Alert and oriented to person place and time.  Skin: Skin is warm and dry. No rashes noted. Psychiatric: Normal mood and affect. Behavior is normal.  Labs 02/2022 : Lipase nml.   Labs 07/2022: CBC unremarkable. CMP nml. TSH nml. HbA1C 5.6%  Labs 01/2023: CBC nml. Ferritin nml. TSH nml. CMP with elevated total bilirubin of 1.4.  Direct bilirubin was normal at 0.2.  LDH normal.  Haptoglobin slightly low at 24.  Pathology smear was fairly normal.  CTA C/A/P w/contrast 03/16/22: IMPRESSION: 1. No acute intrathoracic pathology. No aortic aneurysm or dissection. 2. Findings suspicious for right pyelonephritis. Correlation with urinalysis recommended. No drainable fluid collection or abscess.  CT A/P w/contrast 02/21/23: IMPRESSION: No acute findings or other significant abnormality  EGD 10/29/22: - Normal esophagus. Biopsied.  - Gastritis. Biopsied.  - Normal examined duodenum. Path: 1. 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 H pylori was negative NEGATIVE FOR INTESTINAL METAPLASIA OR DYSPLASIA. 2. Surgical [P], esophagus ESOPHAGEAL SQUAMOUS MUCOSA WITH NO SIGNIFICANT DIAGNOSTIC ALTERATION. NO EVIDENCE OF INTRAEPITHELIAL EOSINOPHILS OR LYMPHOCYTES.  Colonoscopy 10/29/22: - Non- bleeding internal hemorrhoids.  - The examination was otherwise normal.  - No specimens collected.  ASSESSMENT AND PLAN: Constipation Fatigue Ab pain - improved GERD Hemorrhoids Gilbert's syndrome Patient some persistent issues with constipation.  She does feel like Trulance helps, but she still is not able to go consistently to have  a BM.  Linzess caused too much cramping in the past.  Thus we will have her try Motegrity to see if this helps with her symptoms better.  If not effective, then constipation therapies in the future.  I did discuss some conservative strategies to help with her constipation as well since patient ideally would like to be off of medications if at all possible.  Acid reflux has been under good control - Continue to stay well hydrated - Continue daily fiber supplement. Just restarted.  - Could try kiwis and/or prunes - Stop Trulance. Start Motegrity 2 mg every day. If this does not work, then can try Amitiza or go back to [employer] - Will consider pelvic floor physical therapy - Try to increase physical activity - RTC 3 months   Eulah Pont, MD  I spent 34 minutes of time, including in depth chart review, independent review of results as outlined above, communicating results with the patient directly, face-to-face time with the patient, coordinating care, ordering studies and medications as appropriate, and documentation.

## 2023-07-25 ENCOUNTER — Telehealth: Payer: Self-pay

## 2023-07-25 NOTE — Telephone Encounter (Signed)
 Pharmacy Patient Advocate Encounter   Received notification from CoverMyMeds that prior authorization for Prucalopride Succinate 2MG  tablets is required/requested.   Insurance verification completed.   The patient is insured through [facility] .   Per test claim: PA required; PA submitted to above mentioned insurance via CoverMyMeds Key/confirmation #/EOC [id number] Status is pending

## 2023-07-26 ENCOUNTER — Other Ambulatory Visit (HOSPITAL_BASED_OUTPATIENT_CLINIC_OR_DEPARTMENT_OTHER): Payer: Self-pay | Admitting: Obstetrics & Gynecology

## 2023-07-26 ENCOUNTER — Encounter: Payer: Self-pay | Admitting: Internal Medicine

## 2023-07-26 ENCOUNTER — Other Ambulatory Visit (HOSPITAL_BASED_OUTPATIENT_CLINIC_OR_DEPARTMENT_OTHER): Payer: Medicaid Other

## 2023-07-26 DIAGNOSIS — N912 Amenorrhea, unspecified: Secondary | ICD-10-CM

## 2023-07-27 LAB — PROLACTIN: Prolactin: 5.2 ng/mL (ref 4.8–33.4)

## 2023-07-27 LAB — TSH: TSH: 1.9 u[IU]/mL (ref 0.450–4.500)

## 2023-07-27 LAB — ESTRADIOL: Estradiol: 22.4 pg/mL

## 2023-07-27 LAB — FOLLICLE STIMULATING HORMONE: FSH: 76.6 m[IU]/mL

## 2023-08-05 ENCOUNTER — Encounter: Payer: Self-pay | Admitting: Internal Medicine

## 2023-08-05 NOTE — Telephone Encounter (Signed)
 Pharmacy Patient Advocate Encounter  Received notification from [facility] that Prior Authorization for Prucalopride Succinate 2MG  tablets  has been DENIED.  Full denial letter will be uploaded to the media tab. See denial reason below.

## 2023-08-05 NOTE — Progress Notes (Unsigned)
 Subjective:    Patient ID: Denise [name], female    DOB: 1976/05/20, 48 y.o.   MRN: [id number]     HPI [name] is here for follow up of her chronic medical problems.  Headaches - sometimes they are in the posterior head x last few days.  Has frontal headaches the past few days - they come and go.  Not a migraine.    Left ear - hurting at times last week - pain stopped - now just a feeling like something is in it.   Has no energy - very tired x few weeks. Sleep was never an issue but lately it takes a while to get to sleep.  She does wake up intermittently.  Getting maybe 5 hrs of sleep.    Not working out - no energy.   Eating good.    Medications and allergies reviewed with patient and updated if appropriate.  Current Outpatient Medications on File Prior to Visit  Medication Sig Dispense Refill   azelastine (ASTELIN) 0.1 % nasal spray Place 1-2 sprays into both nostrils 2 (two) times daily as needed (nasal drainage). Use in each nostril as directed 30 mL 5   cetirizine (ZYRTEC ALLERGY) 10 MG tablet May take 10mg  1-2 timer per day for hives and allergies. 60 tablet 5   cromolyn (OPTICROM) 4 % ophthalmic solution Place 1 drop into both eyes 4 (four) times daily as needed (itchy/watery eyes). 10 mL 5   fluticasone (FLONASE) 50 MCG/ACT nasal spray Place 1 spray into both nostrils 2 (two) times daily as needed (nasal congestion). 16 g 5   hydrocortisone 2.5 % cream Apply topically 2 (two) times daily. 30 g 0   hydrocortisone-pramoxine (PROCTOFOAM-HC) rectal foam Place 1 applicator rectally 2 (two) times daily. 10 g 2   indapamide (LOZOL) 1.25 MG tablet Take 1 tablet (1.25 mg total) by mouth daily. 90 tablet 2   lisdexamfetamine (VYVANSE) 30 MG capsule Take 1 capsule (30 mg total) by mouth daily. 30 capsule 0   medroxyPROGESTERone (PROVERA) 10 MG tablet Take 1 tablet (10 mg total) by mouth daily. 10 tablet 0   NIFEdipine (PROCARDIA XL/NIFEDICAL XL) 60 MG 24 hr tablet Take 1 tablet  (60 mg total) by mouth daily. 90 tablet 2   Olopatadine HCl 0.2 % SOLN Apply 1 drop to eye daily as needed (itchy/watery eyes). 2.5 mL 5   omeprazole (PRILOSEC) 40 MG capsule Take 1 capsule (40 mg total) by mouth daily. 90 capsule 1   potassium chloride SA (KLOR-CON M) 20 MEQ tablet Take 1 tablet (20 mEq total) by mouth daily. 90 tablet 3   Prenatal Vit-Fe Fumarate-FA (PRENATAL MULTIVITAMIN) TABS tablet Take 1 tablet by mouth daily at 12 noon.     Prucalopride Succinate (MOTEGRITY) 2 MG TABS Take 1 tablet (2 mg total) by mouth daily. 30 tablet 3   tiZANidine (ZANAFLEX) 4 MG tablet Take 4 mg by mouth at bedtime.     TRULANCE 3 MG TABS TAKE 1 TABLET BY MOUTH DAILY 30 tablet 2   valACYclovir (VALTREX) 1000 MG tablet Take 1 tablet (1,000 mg total) by mouth daily. 90 tablet 4   famotidine (PEPCID) 20 MG tablet Take 1 tablet (20 mg total) by mouth 2 (two) times daily as needed for heartburn or indigestion. 60 tablet 2   No current facility-administered medications on file prior to visit.     Review of Systems  Constitutional:  Negative for fever.  HENT:  Positive for ear pain (  was hurting last week - now just feels like something is in it) and rhinorrhea. Negative for hearing loss.   Respiratory:  Negative for cough, shortness of breath and wheezing.   Cardiovascular:  Positive for palpitations (rare). Negative for chest pain and leg swelling.  Musculoskeletal:  Positive for neck pain and neck stiffness (left side of neck).  Neurological:  Positive for light-headedness (sometimes) and headaches.       Objective:   Vitals:   08/06/23 1010  BP: 110/78  Pulse: 80  Temp: 98.5 F (36.9 C)  SpO2: 100%   BP Readings from Last 3 Encounters:  08/06/23 110/78  07/24/23 104/70  05/09/23 116/82   Wt Readings from Last 3 Encounters:  08/06/23 148 lb (67.1 kg)  07/24/23 148 lb (67.1 kg)  05/09/23 152 lb (68.9 kg)   Body mass index is 25.6 kg/m.    Physical Exam Constitutional:       General: She is not in acute distress.    Appearance: Normal appearance.  HENT:     Head: Normocephalic and atraumatic.  Eyes:     Conjunctiva/sclera: Conjunctivae normal.  Cardiovascular:     Rate and Rhythm: Normal rate and regular rhythm.     Heart sounds: Normal heart sounds.  Pulmonary:     Effort: Pulmonary effort is normal. No respiratory distress.     Breath sounds: Normal breath sounds. No wheezing.  Abdominal:     General: There is no distension.     Palpations: Abdomen is soft.     Tenderness: There is no abdominal tenderness.  Musculoskeletal:     Cervical back: Neck supple.     Right lower leg: No edema.     Left lower leg: No edema.  Lymphadenopathy:     Cervical: No cervical adenopathy.  Skin:    General: Skin is warm and dry.     Findings: No rash.  Neurological:     Mental Status: She is alert. Mental status is at baseline.  Psychiatric:        Mood and Affect: Mood normal.        Behavior: Behavior normal.        Lab Results  Component Value Date   WBC 4.5 02/06/2023   HGB 14.3 02/06/2023   HCT 42.0 02/06/2023   PLT 263.0 02/06/2023   GLUCOSE 89 02/06/2023   CHOL 251 (H) 02/06/2023   TRIG 56.0 02/06/2023   HDL 87.30 02/06/2023   LDLCALC 153 (H) 02/06/2023   ALT 19 02/06/2023   AST 26 02/06/2023   NA 139 02/06/2023   K 3.7 02/06/2023   CL 98 02/06/2023   CREATININE 0.88 02/06/2023   BUN 18 02/06/2023   CO2 32 02/06/2023   TSH 1.900 07/26/2023   INR 1.0 07/26/2020   HGBA1C 5.2 02/06/2023   The 10-year ASCVD risk score (Arnett DK, et al., 2019) is: 0.6%   Values used to calculate the score:     Age: 47 years     Sex: Female     Is Non-Hispanic African American: Yes     Diabetic: No     Tobacco smoker: No     Systolic Blood Pressure: 110 mmHg     Is BP treated: Yes     HDL Cholesterol: 87.3 mg/dL     Total Cholesterol: 251 mg/dL   Assessment & Plan:    See Problem List for Assessment and Plan of chronic medical problems.

## 2023-08-05 NOTE — Patient Instructions (Addendum)
    Tetanus vaccine today.    Blood work was ordered.       Medications changes include :   hold the indapamide and potassium for now and monitor your BP - goal is less than 130/80.   Trazodone 50 mg nightly at needed.     A referral was ordered for a therapist at [facility].    Return in about 6 months (around 02/06/2024) for Physical Exam.

## 2023-08-06 ENCOUNTER — Ambulatory Visit: Payer: Medicaid Other | Admitting: Internal Medicine

## 2023-08-06 VITALS — BP 110/78 | HR 80 | Temp 98.5°F | Ht 63.75 in | Wt 148.0 lb

## 2023-08-06 DIAGNOSIS — R41840 Attention and concentration deficit: Secondary | ICD-10-CM | POA: Diagnosis not present

## 2023-08-06 DIAGNOSIS — R739 Hyperglycemia, unspecified: Secondary | ICD-10-CM | POA: Diagnosis not present

## 2023-08-06 DIAGNOSIS — F419 Anxiety disorder, unspecified: Secondary | ICD-10-CM | POA: Diagnosis not present

## 2023-08-06 DIAGNOSIS — T502X5A Adverse effect of carbonic-anhydrase inhibitors, benzothiadiazides and other diuretics, initial encounter: Secondary | ICD-10-CM | POA: Diagnosis not present

## 2023-08-06 DIAGNOSIS — R5383 Other fatigue: Secondary | ICD-10-CM | POA: Diagnosis not present

## 2023-08-06 DIAGNOSIS — F32A Depression, unspecified: Secondary | ICD-10-CM

## 2023-08-06 DIAGNOSIS — Z23 Encounter for immunization: Secondary | ICD-10-CM

## 2023-08-06 DIAGNOSIS — E876 Hypokalemia: Secondary | ICD-10-CM

## 2023-08-06 DIAGNOSIS — R519 Headache, unspecified: Secondary | ICD-10-CM | POA: Diagnosis not present

## 2023-08-06 DIAGNOSIS — E782 Mixed hyperlipidemia: Secondary | ICD-10-CM | POA: Diagnosis not present

## 2023-08-06 DIAGNOSIS — I10 Essential (primary) hypertension: Secondary | ICD-10-CM | POA: Diagnosis not present

## 2023-08-06 DIAGNOSIS — G43109 Migraine with aura, not intractable, without status migrainosus: Secondary | ICD-10-CM

## 2023-08-06 LAB — COMPREHENSIVE METABOLIC PANEL
ALT: 23 U/L (ref 0–35)
AST: 29 U/L (ref 0–37)
Albumin: 4.8 g/dL (ref 3.5–5.2)
Alkaline Phosphatase: 80 U/L (ref 39–117)
BUN: 13 mg/dL (ref 6–23)
CO2: 33 meq/L — ABNORMAL HIGH (ref 19–32)
Calcium: 10.3 mg/dL (ref 8.4–10.5)
Chloride: 98 meq/L (ref 96–112)
Creatinine, Ser: 0.73 mg/dL (ref 0.40–1.20)
GFR: 98.94 mL/min (ref >60.00)
Glucose, Bld: 87 mg/dL (ref 70–99)
Potassium: 3.3 meq/L — ABNORMAL LOW (ref 3.5–5.1)
Sodium: 139 meq/L (ref 135–145)
Total Bilirubin: 1.6 mg/dL — ABNORMAL HIGH (ref 0.2–1.2)
Total Protein: 7.6 g/dL (ref 6.0–8.3)

## 2023-08-06 LAB — LIPID PANEL
Cholesterol: 235 mg/dL — ABNORMAL HIGH (ref 0–200)
HDL: 89.2 mg/dL (ref >39.00)
LDL Cholesterol: 136 mg/dL — ABNORMAL HIGH (ref 0–99)
NonHDL: 145.91
Total CHOL/HDL Ratio: 3
Triglycerides: 51 mg/dL (ref 0.0–149.0)
VLDL: 10.2 mg/dL (ref 0.0–40.0)

## 2023-08-06 LAB — HEMOGLOBIN A1C: Hgb A1c MFr Bld: 5.4 % (ref 4.6–6.5)

## 2023-08-06 MED ORDER — TRAZODONE HCL 50 MG PO TABS: 50.0000 mg | ORAL_TABLET | Freq: Every day | ORAL | 2 refills | Status: DC

## 2023-08-06 MED ORDER — LISDEXAMFETAMINE DIMESYLATE 30 MG PO CAPS: 30.0000 mg | ORAL_CAPSULE | Freq: Every day | ORAL | 0 refills | Status: DC

## 2023-08-06 MED ORDER — TIZANIDINE HCL 4 MG PO TABS
4.0000 mg | ORAL_TABLET | Freq: Every day | ORAL | 3 refills | Status: AC
Start: 2023-08-06 — End: ?

## 2023-08-06 NOTE — Addendum Note (Signed)
 Addended by: Karma Ganja on: 08/06/2023 03:55 PM   Modules accepted: Orders

## 2023-08-06 NOTE — Assessment & Plan Note (Signed)
 Chronic Lab Results  Component Value Date   HGBA1C 5.2 02/06/2023   Check a1c Low sugar / carb diet Stressed regular exercise

## 2023-08-06 NOTE — Assessment & Plan Note (Addendum)
 Chronic Related to indapamide CMP Continue potassium chloride 20 mEq daily as long as she is taking the indapamide

## 2023-08-06 NOTE — Assessment & Plan Note (Addendum)
 Chronic Anxiety increased - difficulty finding a job and worrying about finances Some depression Defers medication at this time Interested in seeing a therapist-referral ordered She will let me know if she changes her mind on medication

## 2023-08-06 NOTE — Assessment & Plan Note (Signed)
 New Having some frequent headaches that are not migraines Posterior headaches likely related to neck pain and muscle tightness Tizanidine 4 mg at bedtime as needed Frontal headaches could be multifactorial-she thinks they have stopped and discussed some of the several causes

## 2023-08-06 NOTE — Assessment & Plan Note (Signed)
 New Has been experiencing fatigue for a few weeks She is not sleeping well-getting about 5 hours which is likely contributing Recent TSH normal so not contributing Will check CBC, CMP She has not been working out recently because she has been so tired and that is likely also contributing to fatigue She is perimenopausal which is likely also contributing Trial of trazodone 50 mg at bedtime as needed to see if that helps-goal would be to use this only as needed for nightly for a few nights and then as needed

## 2023-08-06 NOTE — Assessment & Plan Note (Signed)
Chronic Controlled, Stable Continue Vyvanse 30 mg daily or take as needed

## 2023-08-06 NOTE — Assessment & Plan Note (Signed)
 Chronic Lab Results  Component Value Date   LDLCALC 153 (H) 02/06/2023   Regular exercise and healthy diet encouraged Check lipid panel ASCVD risk is low Continue lifestyle control

## 2023-08-06 NOTE — Assessment & Plan Note (Addendum)
 Chronic Blood pressure well controlled She would ideally like to decrease her medication and blood pressure has been well-controlled so we will do a trial of decreasing medication cmp Hold indapamide and potassium and she will monitor her BP closely.  Discussed goal of BP less than 130/80 Continue nifedipine XL 60 mg daily

## 2023-08-06 NOTE — Assessment & Plan Note (Signed)
 Chronic Frequent migraines Was seeing specialist - no longer seeing them Tizanidine 4 mg HS prn

## 2023-08-07 ENCOUNTER — Other Ambulatory Visit: Payer: Self-pay

## 2023-08-07 MED ORDER — LUBIPROSTONE 8 MCG PO CAPS: 8.0000 ug | ORAL_CAPSULE | Freq: Two times a day (BID) | ORAL | 2 refills | Status: AC

## 2023-08-08 ENCOUNTER — Encounter: Payer: Self-pay | Admitting: Internal Medicine

## 2023-08-12 ENCOUNTER — Other Ambulatory Visit: Payer: Self-pay | Admitting: Internal Medicine

## 2023-08-13 MED ORDER — LISDEXAMFETAMINE DIMESYLATE 30 MG PO CAPS: 30.0000 mg | ORAL_CAPSULE | Freq: Every day | ORAL | 0 refills | Status: DC

## 2023-08-14 ENCOUNTER — Encounter: Payer: Self-pay | Admitting: Internal Medicine

## 2023-08-16 ENCOUNTER — Ambulatory Visit: Admitting: Internal Medicine

## 2023-08-16 ENCOUNTER — Encounter: Payer: Self-pay | Admitting: Internal Medicine

## 2023-08-16 VITALS — BP 104/70 | HR 100 | Temp 98.3°F | Ht 63.75 in

## 2023-08-16 DIAGNOSIS — R1013 Epigastric pain: Secondary | ICD-10-CM | POA: Diagnosis not present

## 2023-08-16 DIAGNOSIS — I10 Essential (primary) hypertension: Secondary | ICD-10-CM | POA: Diagnosis not present

## 2023-08-16 DIAGNOSIS — R519 Headache, unspecified: Secondary | ICD-10-CM | POA: Diagnosis not present

## 2023-08-16 DIAGNOSIS — R109 Unspecified abdominal pain: Secondary | ICD-10-CM | POA: Insufficient documentation

## 2023-08-16 NOTE — Assessment & Plan Note (Signed)
 Has been having some headaches the past few days Headache improved after Tylenol and tizanidine Not related to BP which was well-controlled Has been drinking fluids on likely dehydration ?  Coming down with some for viral illness Continue symptomatic treatment

## 2023-08-16 NOTE — Progress Notes (Signed)
 Subjective:    Patient ID: Denise [name], female    DOB: [birth year], 48 y.o.   MRN: [id number]      HPI Catina is here for  Chief Complaint  Patient presents with   Headache    Headache and swelling in lower extremities that started on Friday; some abdominal pain in the middle of her abdomen; discomfort on both sides of her abdomen; bouts with nausea; Patient had diarrhea and then turned to constipation; Tightness in neck    One week ago - started having leg swelling in ankles and feet - did not take water pill x several days.  Just restarted and that has improved.  Nausea stared 3/18 -she also had abdominal bloating and abdominal pain in epigastric area and with deep breath she would have pain and it would catch so she was unable to take a deep breath.  She also had pain with turning.  Yesterday took water pill -abdominal bloating improved- abd pain improved.  She also started taking her Prilosec.  Had tightness in neck.  In addition to the headache.  She did take Tylenol yesterday and ended up taking a muscle relaxer later and that seemed to have helped.  Still has a slight headache but it is better.  Took tylenol yesterday and slept and later a muscle relaxer    She held indapamide from 1 week ago until yesterday to see if her blood pressure could be able to tolerate reduction in her medication.  Her blood pressure was well-controlled.   Medications and allergies reviewed with patient and updated if appropriate.  Current Outpatient Medications on File Prior to Visit  Medication Sig Dispense Refill   azelastine (ASTELIN) 0.1 % nasal spray Place 1-2 sprays into both nostrils 2 (two) times daily as needed (nasal drainage). Use in each nostril as directed 30 mL 5   cetirizine (ZYRTEC ALLERGY) 10 MG tablet May take 10mg  1-2 timer per day for hives and allergies. 60 tablet 5   cromolyn (OPTICROM) 4 % ophthalmic solution Place 1 drop into both eyes 4 (four) times daily as needed  (itchy/watery eyes). 10 mL 5   fluticasone (FLONASE) 50 MCG/ACT nasal spray Place 1 spray into both nostrils 2 (two) times daily as needed (nasal congestion). 16 g 5   hydrocortisone 2.5 % cream Apply topically 2 (two) times daily. 30 g 0   hydrocortisone-pramoxine (PROCTOFOAM-HC) rectal foam Place 1 applicator rectally 2 (two) times daily. 10 g 2   indapamide (LOZOL) 1.25 MG tablet Take 1 tablet (1.25 mg total) by mouth daily. 90 tablet 2   lisdexamfetamine (VYVANSE) 30 MG capsule Take 1 capsule (30 mg total) by mouth daily. 30 capsule 0   lubiprostone (AMITIZA) 8 MCG capsule Take 1 capsule (8 mcg total) by mouth 2 (two) times daily with a meal. 60 capsule 2   medroxyPROGESTERone (PROVERA) 10 MG tablet Take 1 tablet (10 mg total) by mouth daily. 10 tablet 0   NIFEdipine (PROCARDIA XL/NIFEDICAL XL) 60 MG 24 hr tablet Take 1 tablet (60 mg total) by mouth daily. 90 tablet 2   Olopatadine HCl 0.2 % SOLN Apply 1 drop to eye daily as needed (itchy/watery eyes). 2.5 mL 5   omeprazole (PRILOSEC) 40 MG capsule Take 1 capsule (40 mg total) by mouth daily. 90 capsule 1   potassium chloride SA (KLOR-CON M) 20 MEQ tablet Take 1 tablet (20 mEq total) by mouth daily. 90 tablet 3   Prenatal Vit-Fe Fumarate-FA (PRENATAL MULTIVITAMIN) TABS tablet Take  1 tablet by mouth daily at 12 noon.     Prucalopride Succinate (MOTEGRITY) 2 MG TABS Take 1 tablet (2 mg total) by mouth daily. 30 tablet 3   tiZANidine (ZANAFLEX) 4 MG tablet Take 1 tablet (4 mg total) by mouth at bedtime. 30 tablet 3   traZODone (DESYREL) 50 MG tablet Take 1 tablet (50 mg total) by mouth at bedtime. 30 tablet 2   TRULANCE 3 MG TABS TAKE 1 TABLET BY MOUTH DAILY 30 tablet 2   valACYclovir (VALTREX) 1000 MG tablet Take 1 tablet (1,000 mg total) by mouth daily. 90 tablet 4   famotidine (PEPCID) 20 MG tablet Take 1 tablet (20 mg total) by mouth 2 (two) times daily as needed for heartburn or indigestion. 60 tablet 2   No current facility-administered  medications on file prior to visit.    Review of Systems  Constitutional:  Negative for fever.  HENT:  Negative for congestion and sore throat.   Respiratory:  Negative for cough and shortness of breath.   Cardiovascular:  Positive for chest pain (a little tightness - went away), palpitations (occ) and leg swelling (improved).  Gastrointestinal:  Positive for abdominal pain (epigastric pain  - resolved), diarrhea (a couple of episodes) and nausea.  Neurological:  Positive for light-headedness (? related to HA) and headaches.       Objective:   Vitals:   08/16/23 1047  BP: 104/70  Pulse: 100  Temp: 98.3 F (36.8 C)  SpO2: 97%   BP Readings from Last 3 Encounters:  08/16/23 104/70  08/06/23 110/78  07/24/23 104/70   Wt Readings from Last 3 Encounters:  08/06/23 148 lb (67.1 kg)  07/24/23 148 lb (67.1 kg)  05/09/23 152 lb (68.9 kg)   Body mass index is 25.6 kg/m.    Physical Exam Constitutional:      General: She is not in acute distress.    Appearance: Normal appearance.  HENT:     Head: Normocephalic and atraumatic.  Eyes:     Conjunctiva/sclera: Conjunctivae normal.  Cardiovascular:     Rate and Rhythm: Normal rate and regular rhythm.     Heart sounds: Normal heart sounds.  Pulmonary:     Effort: Pulmonary effort is normal. No respiratory distress.     Breath sounds: Normal breath sounds. No wheezing.  Abdominal:     General: There is no distension.     Palpations: Abdomen is soft.     Tenderness: There is abdominal tenderness (Minimal tenderness epigastric region). There is no guarding or rebound.  Musculoskeletal:     Cervical back: Neck supple.     Right lower leg: No edema.     Left lower leg: No edema.  Lymphadenopathy:     Cervical: No cervical adenopathy.  Skin:    General: Skin is warm and dry.     Findings: No rash.  Neurological:     Mental Status: She is alert. Mental status is at baseline.  Psychiatric:        Mood and Affect: Mood  normal.        Behavior: Behavior normal.            Assessment & Plan:    See Problem List for Assessment and Plan of chronic medical problems.

## 2023-08-16 NOTE — Assessment & Plan Note (Signed)
 Chronic Blood pressure well controlled She did do a trial of holding the indapamide for a week and ended up having fluid retention and not feeling well, but BP was controlled Restarted indapamide-continue indapamide 1.25 mg daily in addition to potassium Until she is feeling better no changes, but since her BP is well-controlled we can consider trying decreasing the nifedipine to 30 mg XL

## 2023-08-16 NOTE — Assessment & Plan Note (Signed)
 Acute Improved She was experiencing some abdominal pain-worse with taking deep breaths and twisting Has improved a little bit Has been taking Prilosec and encouraged her to continue that for several days in addition to eating a bland diet and lots of water She will let me know if the abdominal pain does not completely resolve or worsen

## 2023-08-16 NOTE — Patient Instructions (Addendum)
     Medications changes include :   continue prilosec for several days      Return if symptoms worsen or fail to improve.

## 2023-08-23 ENCOUNTER — Telehealth: Payer: Self-pay

## 2023-08-23 NOTE — Telephone Encounter (Signed)
 Pharmacy Patient Advocate Encounter   Received notification from CoverMyMeds that prior authorization for Trulance 3MG  tablets is required/requested.   Insurance verification completed.   The patient is insured through [facility] .   Per test claim: Medication was changed to Amitiza on 07-25-2023 encounter due to [employer] denial.

## 2023-09-11 ENCOUNTER — Other Ambulatory Visit: Payer: Self-pay | Admitting: Internal Medicine

## 2023-09-11 MED ORDER — LISDEXAMFETAMINE DIMESYLATE 30 MG PO CAPS: 30.0000 mg | ORAL_CAPSULE | Freq: Every day | ORAL | 0 refills | Status: DC

## 2023-10-09 ENCOUNTER — Ambulatory Visit: Payer: Medicaid Other | Admitting: Allergy

## 2023-10-09 ENCOUNTER — Other Ambulatory Visit: Payer: Self-pay

## 2023-10-09 ENCOUNTER — Encounter: Payer: Self-pay | Admitting: Allergy

## 2023-10-09 VITALS — BP 118/70 | HR 88 | Temp 97.9°F | Resp 18 | Ht 63.75 in | Wt 140.3 lb

## 2023-10-09 DIAGNOSIS — J3089 Other allergic rhinitis: Secondary | ICD-10-CM

## 2023-10-09 DIAGNOSIS — H1013 Acute atopic conjunctivitis, bilateral: Secondary | ICD-10-CM | POA: Diagnosis not present

## 2023-10-09 DIAGNOSIS — H6993 Unspecified Eustachian tube disorder, bilateral: Secondary | ICD-10-CM | POA: Diagnosis not present

## 2023-10-09 DIAGNOSIS — J3081 Allergic rhinitis due to animal (cat) (dog) hair and dander: Secondary | ICD-10-CM | POA: Diagnosis not present

## 2023-10-09 DIAGNOSIS — L508 Other urticaria: Secondary | ICD-10-CM | POA: Diagnosis not present

## 2023-10-09 MED ORDER — FLUTICASONE PROPIONATE 50 MCG/ACT NA SUSP: 1.0000 | Freq: Every day | NASAL | 5 refills | Status: AC | PRN

## 2023-10-09 MED ORDER — CETIRIZINE HCL 10 MG PO TABS
ORAL_TABLET | ORAL | 5 refills | Status: AC
Start: 2023-10-09 — End: ?

## 2023-10-09 MED ORDER — CROMOLYN SODIUM 4 % OP SOLN: 1.0000 [drp] | Freq: Four times a day (QID) | OPHTHALMIC | 5 refills | Status: AC | PRN

## 2023-10-09 MED ORDER — AZELASTINE HCL 0.1 % NA SOLN: 1.0000 | Freq: Two times a day (BID) | NASAL | 5 refills | Status: AC | PRN

## 2023-10-09 NOTE — Progress Notes (Signed)
 Follow Up Note  RE: Denise [name] MRN: [id number] DOB: 07-Aug-1975 Date of Office Visit: 10/09/2023  Referring provider: Colene Dauphin, MD Primary care provider: Colene Dauphin, MD  Chief Complaint: Urticaria (No issues - skin feels like hives may  come but never appear )  History of Present Illness: I had the pleasure of seeing Denise [name] for a follow up visit at the [facility] on 10/09/2023. She is a 48 y.o. female, who is being followed for allergic rhinoconjunctivitis and chronic urticaria. Her previous allergy  office visit was on 10/10/2022 with Dr. Burdette Carolin. Today is a regular follow up visit.  Discussed the use of AI scribe software for clinical note transcription with the patient, who gave verbal consent to proceed.    Her hives have been under control for the past year. Recently, she experienced sensations of skin 'stretchiness' similar to pre-hive episodes intermittently over the past few weeks, but no actual hives developed. She did not take additional allergy  medication during these episodes.  She manages her allergies with Zyrtec , taken daily during symptomatic periods, such as when experiencing nasal drip. She also uses Flonase  nasal spray and azelastine  nasal spray and cromolyn  eye drops as needed only.  She has been focusing on weight management, reporting a significant weight loss from 182 pounds last year to 140 pounds currently, attributed to dietary changes including a 1500 calorie diet. She is working on reintroducing regular exercise into her routine. Losing weight has helped with her clothing [name] better and reducing skin irritation.  She occasionally takes Prilosec for stomach issues and prenatal vitamins intermittently for iron  and vitamin D  supplementation. She notes being premenopausal.  No ear pain, discharge, or hearing loss. Reports nasal drip, occasional ear fullness, and itchiness, particularly when experiencing nasal drainage.      Assessment and Plan: Denise [name] is a 48 y.o. female with: Allergic rhinitis due to dust mite Allergic rhinitis due to animal dander Allergic conjunctivitis of both eyes Past history - 2019 skin testing positive to dust mites and cats.  Interim history - Intermittent symptoms. Satisfied with current management, prefers medication as needed. Continue environmental control measures.  Use over the counter antihistamines such as Zyrtec  (cetirizine ), Claritin (loratadine), Allegra (fexofenadine), or Xyzal (levocetirizine) daily as needed. May take twice a day during allergy  flares. May switch antihistamines every few months. Use Flonase  (fluticasone ) nasal spray 1-2 sprays per nostril once a day as needed for nasal congestion.  Nasal saline spray (i.e., Simply Saline) or nasal saline lavage (i.e., NeilMed) is recommended as needed and prior to medicated nasal sprays. Use azelastine  nasal spray 1-2 sprays per nostril twice a day as needed for runny nose/drainage. Use cromolyn  4% 1 drop in each eye up to four times a day as needed for itchy/watery eyes.  Do not put over the contact lens and wait about 10-15 minutes before putting contact lens in.  Consider allergy  injections for long term control if above medications do not help the symptoms.  Dysfunction of both eustachian tubes Intermittent ear fullness likely due to Eustachian tube dysfunction secondary to allergic rhinitis. Symptoms worsen with nasal congestion and postnasal drip. Advised regular nasal spray use during congestion and postnasal drip. Consider ENT referral if symptoms persist.   Chronic urticaria Past history - Pruritic hives/welts on and off for the past 10 months. Seemed to be worse since she had miscarriage and seem to occur more frequently during her menses. Positive dermatographism on exam. Bloodwork unremarkable - TSH, CMP,  CBC with Diff, ANA w/Reflex Interim history - no hives but noted sensation of prehives the past few weeks  which resolved on its own. May take zyrtec  10mg  1-2 times a day as needed.  Avoid the following potential triggers: alcohol, tight clothing, NSAIDs.  Keep track of episodes and take pictures.   Return in about 1 year (around 10/08/2024).  Meds ordered this encounter  Medications   azelastine  (ASTELIN ) 0.1 % nasal spray    Sig: Place 1-2 sprays into both nostrils 2 (two) times daily as needed (nasal drainage). Use in each nostril as directed    Dispense:  30 mL    Refill:  5   cetirizine  (ZYRTEC  ALLERGY ) 10 MG tablet    Sig: May take 10mg  1-2 timer per day for hives and allergies.    Dispense:  60 tablet    Refill:  5   cromolyn  (OPTICROM ) 4 % ophthalmic solution    Sig: Place 1 drop into both eyes 4 (four) times daily as needed (itchy/watery eyes).    Dispense:  10 mL    Refill:  5   fluticasone  (FLONASE ) 50 MCG/ACT nasal spray    Sig: Place 1-2 sprays into both nostrils daily as needed (nasal congestion).    Dispense:  16 g    Refill:  5   Lab Orders  No laboratory test(s) ordered today   Diagnostics: None.   Medication List:  Current Outpatient Medications  Medication Sig Dispense Refill   fluticasone  (FLONASE ) 50 MCG/ACT nasal spray Place 1-2 sprays into both nostrils daily as needed (nasal congestion). 16 g 5   hydrocortisone -pramoxine (PROCTOFOAM-HC) rectal foam Place 1 applicator rectally 2 (two) times daily. 10 g 2   indapamide  (LOZOL ) 1.25 MG tablet Take 1 tablet (1.25 mg total) by mouth daily. 90 tablet 2   lisdexamfetamine (VYVANSE ) 30 MG capsule Take 1 capsule (30 mg total) by mouth daily. 30 capsule 0   medroxyPROGESTERone  (PROVERA ) 10 MG tablet Take 1 tablet (10 mg total) by mouth daily. 10 tablet 0   NIFEdipine  (PROCARDIA  XL/NIFEDICAL XL) 60 MG 24 hr tablet Take 1 tablet (60 mg total) by mouth daily. 90 tablet 2   omeprazole  (PRILOSEC) 40 MG capsule Take 1 capsule (40 mg total) by mouth daily. 90 capsule 1   potassium chloride  SA (KLOR-CON  M) 20 MEQ tablet Take  1 tablet (20 mEq total) by mouth daily. 90 tablet 3   Prenatal Vit-Fe Fumarate-FA (PRENATAL MULTIVITAMIN) TABS tablet Take 1 tablet by mouth daily at 12 noon.     Prucalopride Succinate  (MOTEGRITY ) 2 MG TABS Take 1 tablet (2 mg total) by mouth daily. 30 tablet 3   tiZANidine  (ZANAFLEX ) 4 MG tablet Take 1 tablet (4 mg total) by mouth at bedtime. 30 tablet 3   traZODone  (DESYREL ) 50 MG tablet Take 1 tablet (50 mg total) by mouth at bedtime. 30 tablet 2   TRULANCE  3 MG TABS TAKE 1 TABLET BY MOUTH DAILY 30 tablet 2   valACYclovir  (VALTREX ) 1000 MG tablet Take 1 tablet (1,000 mg total) by mouth daily. 90 tablet 4   azelastine  (ASTELIN ) 0.1 % nasal spray Place 1-2 sprays into both nostrils 2 (two) times daily as needed (nasal drainage). Use in each nostril as directed 30 mL 5   cetirizine  (ZYRTEC  ALLERGY ) 10 MG tablet May take 10mg  1-2 timer per day for hives and allergies. 60 tablet 5   cromolyn  (OPTICROM ) 4 % ophthalmic solution Place 1 drop into both eyes 4 (four) times daily as needed (  itchy/watery eyes). 10 mL 5   famotidine  (PEPCID ) 20 MG tablet Take 1 tablet (20 mg total) by mouth 2 (two) times daily as needed for heartburn or indigestion. 60 tablet 2   hydrocortisone  2.5 % cream Apply topically 2 (two) times daily. (Patient not taking: Reported on 10/09/2023) 30 g 0   No current facility-administered medications for this visit.   Allergies: Allergies  Allergen Reactions   Ace Inhibitors     Swelling og tongue and lips   Adhesive [Tape] Rash   Augmentin  [Amoxicillin -Pot Clavulanate] Rash    Rash on lower legs   Latex Itching and Rash    Rash and itching from use of gloves   I reviewed her past medical history, social history, family history, and environmental history and no significant changes have been reported from her previous visit.  Review of Systems  Constitutional:  Negative for appetite change, chills, fever and unexpected weight change.  HENT:  Positive for congestion and  rhinorrhea.   Eyes:  Positive for itching.  Respiratory:  Negative for cough, chest tightness, shortness of breath and wheezing.   Cardiovascular:  Negative for chest pain.  Gastrointestinal:  Negative for abdominal pain.  Genitourinary:  Negative for difficulty urinating.  Skin:  Negative for rash.  Allergic/Immunologic: Positive for environmental allergies. Negative for food allergies.    Objective: BP 118/70 (BP Location: Right Arm, Patient Position: Sitting, Cuff Size: Normal)   Pulse 88   Temp 97.9 F (36.6 C) (Temporal)   Resp 18   Ht 5' 3.75" (1.619 m)   Wt 140 lb 4.8 oz (63.6 kg)   SpO2 97%   BMI 24.27 kg/m  Body mass index is 24.27 kg/m. Physical Exam Vitals and nursing note reviewed.  Constitutional:      Appearance: Normal appearance. She is well-developed.  HENT:     Head: Normocephalic and atraumatic.     Right Ear: Tympanic membrane and external ear normal.     Left Ear: Tympanic membrane and external ear normal.     Nose: Nose normal.  Eyes:     Conjunctiva/sclera: Conjunctivae normal.  Cardiovascular:     Rate and Rhythm: Normal rate and regular rhythm.     Heart sounds: Normal heart sounds. No murmur heard.    No friction rub. No gallop.  Pulmonary:     Effort: Pulmonary effort is normal.     Breath sounds: Normal breath sounds. No wheezing or rales.  Musculoskeletal:     Cervical back: Neck supple.  Skin:    General: Skin is warm.     Findings: No rash.  Neurological:     Mental Status: She is alert and oriented to person, place, and time.  Psychiatric:        Behavior: Behavior normal.   Previous notes and tests were reviewed. The plan was reviewed with the patient/family, and all questions/concerned were addressed.  It was my pleasure to see Denise [name] today and participate in her care. Please feel free to contact me with any questions or concerns.  Sincerely,  Eudelia Hero, DO Allergy  & Immunology  [facility] office: [phone] [facility] office: [phone]

## 2023-10-09 NOTE — Patient Instructions (Addendum)
 Environmental allergies 2019 skin testing positive to dust mites and cats.  Continue environmental control measures.  Use over the counter antihistamines such as Zyrtec  (cetirizine ), Claritin (loratadine), Allegra (fexofenadine), or Xyzal (levocetirizine) daily as needed. May take twice a day during allergy  flares. May switch antihistamines every few months. Use Flonase  (fluticasone ) nasal spray 1-2 sprays per nostril once a day as needed for nasal congestion.  Nasal saline spray (i.e., Simply Saline) or nasal saline lavage (i.e., NeilMed) is recommended as needed and prior to medicated nasal sprays. Use azelastine  nasal spray 1-2 sprays per nostril twice a day as needed for runny nose/drainage. Use cromolyn  4% 1 drop in each eye up to four times a day as needed for itchy/watery eyes.  Do not put over the contact lens and wait about 10-15 minutes before putting contact lens in.  Consider allergy  injections for long term control if above medications do not help the symptoms.  Eustachian tube dysfunction Eustachian tube dysfunction occurs when the tube connecting your middle ear to the back of your nose doesn't open and close properly, leading to ear fullness and discomfort. This is often related to your allergic rhinitis. You should use your nasal spray regularly during periods of congestion and postnasal drip. If your symptoms continue, we may consider an ENT referral.  Chronic urticaria May take zyrtec  10mg  1-2 times a day as needed.  Avoid the following potential triggers: alcohol, tight clothing, NSAIDs.  Keep track of episodes and take pictures.   Follow up in 1 year or sooner if needed.   Control of House Dust Mite Allergen Dust mite allergens are a common trigger of allergy  and asthma symptoms. While they can be found throughout the house, these microscopic creatures thrive in warm, humid environments such as bedding, upholstered furniture and carpeting. Because so much time is spent in  the bedroom, it is essential to reduce mite levels there.  Encase pillows, mattresses, and box springs in special allergen-proof fabric covers or airtight, zippered plastic covers.  Bedding should be washed weekly in hot water (130 F) and dried in a hot dryer. Allergen-proof covers are available for comforters and pillows that can't be regularly washed.  Wash the allergy -proof covers every few months. Minimize clutter in the bedroom. Keep pets out of the bedroom.  Keep humidity less than 50% by using a dehumidifier or air conditioning. You can buy a humidity measuring device called a hygrometer to monitor this.  If possible, replace carpets with hardwood, linoleum, or washable area rugs. If that's not possible, vacuum frequently with a vacuum that has a HEPA filter. Remove all upholstered furniture and non-washable window drapes from the bedroom. Remove all non-washable stuffed toys from the bedroom.  Wash stuffed toys weekly. Reducing Pollen Exposure Pollen seasons: trees (spring), grass (summer) and ragweed/weeds (fall). Keep windows closed in your home and car to lower pollen exposure.  Install air conditioning in the bedroom and throughout the house if possible.  Avoid going out in dry windy days - especially early morning. Pollen counts are highest between 5 - 10 AM and on dry, hot and windy days.  Save outside activities for late afternoon or after a heavy rain, when pollen levels are lower.  Avoid mowing of grass if you have grass pollen allergy . Be aware that pollen can also be transported indoors on people and pets.  Dry your clothes in an automatic dryer rather than hanging them outside where they might collect pollen.  Rinse hair and eyes before bedtime. Pet  Allergen Avoidance: Contrary to popular opinion, there are no "hypoallergenic" breeds of dogs or cats. That is because people are not allergic to an animal's hair, but to an allergen found in the animal's saliva, dander (dead skin  flakes) or urine. Pet allergy  symptoms typically occur within minutes. For some people, symptoms can build up and become most severe 8 to 12 hours after contact with the animal. People with severe allergies can experience reactions in public places if dander has been transported on the pet owners' clothing. Keeping an animal outdoors is only a partial solution, since homes with pets in the yard still have higher concentrations of animal allergens. Before getting a pet, ask your allergist to determine if you are allergic to animals. If your pet is already considered part of your family, try to minimize contact and keep the pet out of the bedroom and other rooms where you spend a great deal of time. As with dust mites, vacuum carpets often or replace carpet with a hardwood floor, tile or linoleum. High-efficiency particulate air (HEPA) cleaners can reduce allergen levels over time. While dander and saliva are the source of cat and dog allergens, urine is the source of allergens from rabbits, hamsters, mice and [place] pigs; so ask a non-allergic family member to clean the animal's cage. If you have a pet allergy , talk to your allergist about the potential for allergy  immunotherapy (allergy  shots). This strategy can often provide long-term relief.

## 2023-10-10 ENCOUNTER — Other Ambulatory Visit: Payer: Self-pay | Admitting: Internal Medicine

## 2023-10-10 MED ORDER — LISDEXAMFETAMINE DIMESYLATE 30 MG PO CAPS: 30.0000 mg | ORAL_CAPSULE | Freq: Every day | ORAL | 0 refills | Status: DC

## 2023-10-14 ENCOUNTER — Encounter: Payer: Self-pay | Admitting: Internal Medicine

## 2023-10-14 ENCOUNTER — Ambulatory Visit: Payer: Medicaid Other | Admitting: Internal Medicine

## 2023-10-14 VITALS — BP 106/60 | HR 102 | Ht 63.0 in | Wt 139.4 lb

## 2023-10-14 DIAGNOSIS — K2970 Gastritis, unspecified, without bleeding: Secondary | ICD-10-CM

## 2023-10-14 DIAGNOSIS — K219 Gastro-esophageal reflux disease without esophagitis: Secondary | ICD-10-CM | POA: Diagnosis not present

## 2023-10-14 DIAGNOSIS — K649 Unspecified hemorrhoids: Secondary | ICD-10-CM

## 2023-10-14 DIAGNOSIS — R1013 Epigastric pain: Secondary | ICD-10-CM

## 2023-10-14 DIAGNOSIS — R1032 Left lower quadrant pain: Secondary | ICD-10-CM | POA: Diagnosis not present

## 2023-10-14 DIAGNOSIS — K5900 Constipation, unspecified: Secondary | ICD-10-CM | POA: Diagnosis not present

## 2023-10-14 MED ORDER — OMEPRAZOLE 40 MG PO CPDR: 40.0000 mg | DELAYED_RELEASE_CAPSULE | Freq: Every day | ORAL | 1 refills | Status: AC

## 2023-10-14 MED ORDER — FAMOTIDINE 20 MG PO TABS: 20.0000 mg | ORAL_TABLET | Freq: Two times a day (BID) | ORAL | 2 refills | Status: AC | PRN

## 2023-10-14 MED ORDER — TRULANCE 3 MG PO TABS: 1.0000 | ORAL_TABLET | Freq: Every day | ORAL | 2 refills | Status: DC

## 2023-10-14 NOTE — Progress Notes (Signed)
 Chief Complaint: Constipation, abdominal pain  HPI : 48 year old female with history of GERD, IBS, migraine, anemia presents for follow up of constipation and abdominal pain  Interval History: She tried the new Motegrity  medication, but this wasn't covered by insurance so she went back to the Trulance . The Trulance  does make her go, but she does not feel like she empties completely at times. Sometimes she will have pain in the LLQ. She is back on the fiber, which has helped as well with her bowel habits. She does note that when she mixes her fiber with water alone, this seems to cause it to become slightly lumpy. She does take Prilosec for nausea, which seems to help with her symptoms. Denies significant bloating issues. Denies NSAID use.  Wt Readings from Last 3 Encounters:  10/14/23 139 lb 6 oz (63.2 kg)  10/09/23 140 lb 4.8 oz (63.6 kg)  08/06/23 148 lb (67.1 kg)   Past Medical History:  Diagnosis Date   Anemia    during pregnancy    Chronic headaches    Colon polyps    Complication of anesthesia    crying excessive spitting   Concussion age 77 or 66 no residual   Family history of adverse reaction to anesthesia    ponv   GERD (gastroesophageal reflux disease)    GERD (gastroesophageal reflux disease)    Gilbert's syndrome    HSV-2 infection    2008   Hypertension    IBS (irritable bowel syndrome)    IC (interstitial cystitis)    2008   Migraine    Urticaria    Wears glasses      Past Surgical History:  Procedure Laterality Date   ADENOIDECTOMY  as child   BREAST SURGERY  2002   right breast biopsy Benign   CESAREAN SECTION N/A 08/30/2013   Procedure: CESAREAN SECTION;  Surgeon: Kandra Orn, MD;  Location: [facility];  Service: Obstetrics;  Laterality: N/A;   colonscopy  04/18/2020   and endoscopy done   DILATION AND EVACUATION N/A 06/23/2020   Procedure: DILATATION AND EVACUATION;  Surgeon: Lillian Rein, MD;  Location: [facility];  Service:  Gynecology;  Laterality: N/A;   KNEE ARTHROSCOPY  3/11, 2012   right knee   SHOULDER ARTHROSCOPY Right 2010   TONSILLECTOMY  as child   and adenoids   WISDOM TOOTH EXTRACTION  2008   Family History  Problem Relation Age of Onset   Diabetes Mother    Hyperlipidemia Mother    Anemia Mother    Irritable bowel syndrome Mother    Hyperlipidemia Father    Hypertension Father    Heart disease Father    Colon polyps Father    Kidney disease Father    Stroke Sister    Hypertension Sister    Kidney disease Sister    Hypertension Sister    Thyroid  disease Sister    Heart disease Brother    Diabetes Maternal Grandmother    Heart failure Maternal Grandmother    Prostate cancer Maternal Grandfather    Diabetes Paternal Grandmother    Stroke Paternal Grandmother    Renal Disease Paternal Grandfather    Heart disease Paternal Grandfather    Stroke Paternal Grandfather    Anemia Maternal Aunt    Prostate cancer Paternal Uncle    Kidney disease Maternal Uncle    Colon cancer Neg Hx    Stomach cancer Neg Hx    Esophageal cancer Neg Hx  Social History   Tobacco Use   Smoking status: Never   Smokeless tobacco: Never  Vaping Use   Vaping status: Never Used  Substance Use Topics   Alcohol use: Not Currently   Drug use: Never    Comment: marijuana last use sept 2021   Current Outpatient Medications  Medication Sig Dispense Refill   azelastine  (ASTELIN ) 0.1 % nasal spray Place 1-2 sprays into both nostrils 2 (two) times daily as needed (nasal drainage). Use in each nostril as directed 30 mL 5   cetirizine  (ZYRTEC  ALLERGY ) 10 MG tablet May take 10mg  1-2 timer per day for hives and allergies. 60 tablet 5   cromolyn  (OPTICROM ) 4 % ophthalmic solution Place 1 drop into both eyes 4 (four) times daily as needed (itchy/watery eyes). 10 mL 5   famotidine  (PEPCID ) 20 MG tablet Take 1 tablet (20 mg total) by mouth 2 (two) times daily as needed for heartburn or indigestion. (Patient taking  differently: Take 20 mg by mouth as needed for heartburn or indigestion.) 60 tablet 2   fluticasone  (FLONASE ) 50 MCG/ACT nasal spray Place 1-2 sprays into both nostrils daily as needed (nasal congestion). 16 g 5   hydrocortisone  2.5 % cream Apply topically 2 (two) times daily. 30 g 0   hydrocortisone -pramoxine (PROCTOFOAM-HC) rectal foam Place 1 applicator rectally 2 (two) times daily. 10 g 2   indapamide  (LOZOL ) 1.25 MG tablet Take 1 tablet (1.25 mg total) by mouth daily. 90 tablet 2   lisdexamfetamine (VYVANSE ) 30 MG capsule Take 1 capsule (30 mg total) by mouth daily. 30 capsule 0   medroxyPROGESTERone  (PROVERA ) 10 MG tablet Take 1 tablet (10 mg total) by mouth daily. 10 tablet 0   NIFEdipine  (PROCARDIA  XL/NIFEDICAL XL) 60 MG 24 hr tablet Take 1 tablet (60 mg total) by mouth daily. 90 tablet 2   omeprazole  (PRILOSEC) 40 MG capsule Take 1 capsule (40 mg total) by mouth daily. (Patient taking differently: Take 40 mg by mouth as needed.) 90 capsule 1   potassium chloride  SA (KLOR-CON  M) 20 MEQ tablet Take 1 tablet (20 mEq total) by mouth daily. 90 tablet 3   Prenatal Vit-Fe Fumarate-FA (PRENATAL MULTIVITAMIN) TABS tablet Take 1 tablet by mouth daily at 12 noon.     Prucalopride Succinate  (MOTEGRITY ) 2 MG TABS Take 1 tablet (2 mg total) by mouth daily. 30 tablet 3   tiZANidine  (ZANAFLEX ) 4 MG tablet Take 1 tablet (4 mg total) by mouth at bedtime. 30 tablet 3   traZODone  (DESYREL ) 50 MG tablet Take 1 tablet (50 mg total) by mouth at bedtime. 30 tablet 2   TRULANCE  3 MG TABS TAKE 1 TABLET BY MOUTH DAILY 30 tablet 2   valACYclovir  (VALTREX ) 1000 MG tablet Take 1 tablet (1,000 mg total) by mouth daily. 90 tablet 4   No current facility-administered medications for this visit.   Allergies  Allergen Reactions   Ace Inhibitors     Swelling og tongue and lips   Adhesive [Tape] Rash   Augmentin  [Amoxicillin -Pot Clavulanate] Rash    Rash on lower legs   Latex Itching and Rash    Rash and itching from  use of gloves   Physical Exam: BP 106/60 (BP Location: Left Arm, Patient Position: Sitting, Cuff Size: Normal)   Pulse (!) 102   Ht 5\' 3"  (1.6 m) Comment: height measured without shoes  Wt 139 lb 6 oz (63.2 kg)   LMP 08/07/2023   BMI 24.69 kg/m  Constitutional: Pleasant,well-developed, female in no acute distress.  HEENT: Normocephalic and atraumatic. Conjunctivae are normal. No scleral icterus. Cardiovascular: Normal rate Pulmonary/chest: Effort normal Abdominal: Soft, nondistended, tender in the epigastric/LUQ ab pain Extremities: No edema Neurological: Alert and oriented to person place and time. Skin: Skin is warm and dry. No rashes noted. Psychiatric: Normal mood and affect. Behavior is normal.  Labs 02/2022 : Lipase nml.   Labs 07/2022: CBC unremarkable. CMP nml. TSH nml. HbA1C 5.6%  Labs 01/2023: CBC nml. Ferritin nml. TSH nml. CMP with elevated total bilirubin of 1.4.  Direct bilirubin was normal at 0.2.  LDH normal.  Haptoglobin slightly low at 24.  Pathology smear was fairly normal.  Labs 07/2023: T bili elevated at 1.6. K slightly low at 3.3.   CTA C/A/P w/contrast 03/16/22: IMPRESSION: 1. No acute intrathoracic pathology. No aortic aneurysm or dissection. 2. Findings suspicious for right pyelonephritis. Correlation with urinalysis recommended. No drainable fluid collection or abscess.  CT A/P w/contrast 02/21/23: IMPRESSION: No acute findings or other significant abnormality  EGD 10/29/22: - Normal esophagus. Biopsied.  - Gastritis. Biopsied.  - Normal examined duodenum. Path: 1. 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 H pylori was negative NEGATIVE FOR INTESTINAL METAPLASIA OR DYSPLASIA. 2. Surgical [P], esophagus ESOPHAGEAL SQUAMOUS MUCOSA WITH NO SIGNIFICANT DIAGNOSTIC ALTERATION. NO EVIDENCE OF INTRAEPITHELIAL EOSINOPHILS OR LYMPHOCYTES.  Colonoscopy 10/29/22: - Non- bleeding internal hemorrhoids.  - The  examination was otherwise normal.  - No specimens collected.  ASSESSMENT AND PLAN: Constipation Epigastric/LLQ ab pain GERD Hemorrhoids Gilbert's syndrome Patient's constipation is partially controlled with Trulance . She was not able to be approved for Motegrity . Linzess caused too much cramping in the past. If patient's constipation were to worsen in the future, could consider switching to Amitiza , adding on Miralax to her Trulance , referring to pelvic floor PT, or trying kiwis for a more natural laxative. Patient is not interested in any therapy changes at this time since her constipation seems to be doing okay. She has been having some epigastric/LUQ ab pain so will try to have her take some acid reflux medications in case this is contributing got her abdominal pain. She will also try to make a better note about whether or not the ab pain is improved after she has a BM. Her last EGD showed some gastritis with some gastric biopsies that were negative for infection.  - Continue to stay well hydrated - Continue daily fiber supplement - Continue Trulance  - Consider pelvic floor physical therapy in th efuture - Try to increase physical activity - Start famotidine  20 mg BID for 7 days. If no relief, then take Prilosec 40 mg every day for 14 days. - RTC 6 months   Regino Caprio, MD  I spent 31 minutes of time, including in depth chart review, independent review of results as outlined above, communicating results with the patient directly, face-to-face time with the patient, coordinating care, ordering studies and medications as appropriate, and documentation.

## 2023-10-14 NOTE — Patient Instructions (Addendum)
 We have sent the following medications to your pharmacy for you to pick up at your convenience:  Omerprazole, Famotidine , Trulance   Start Famotidine  20 mg 2 times daily for 7 days. If no relief, then take Prilosec(Omeprazole ) 40 mg every day for 14 day   If your blood pressure at your visit was 140/90 or greater, please contact your primary care physician to follow up on this.  _______________________________________________________  If you are age 46 or older, your body mass index should be between 23-30. Your Body mass index is 24.69 kg/m. If this is out of the aforementioned range listed, please consider follow up with your Primary Care Provider.  If you are age 16 or younger, your body mass index should be between 19-25. Your Body mass index is 24.69 kg/m. If this is out of the aformentioned range listed, please consider follow up with your Primary Care Provider.   ________________________________________________________  The [facility] providers would like to encourage you to use MYCHART to communicate with providers for non-urgent requests or questions.  Due to long hold times on the telephone, sending your provider a message by [facility] may be a faster and more efficient way to get a response.  Please allow 48 business hours for a response.  Please remember that this is for non-urgent requests.  _______________________________________________________   Thank you for entrusting me with your care and for choosing [facility],  Dr. Regino Caprio

## 2023-12-09 ENCOUNTER — Ambulatory Visit: Payer: Self-pay

## 2023-12-09 ENCOUNTER — Ambulatory Visit: Admitting: Internal Medicine

## 2023-12-09 VITALS — BP 112/64 | HR 100 | Temp 98.5°F | Ht 63.0 in | Wt 139.8 lb

## 2023-12-09 DIAGNOSIS — J019 Acute sinusitis, unspecified: Secondary | ICD-10-CM | POA: Insufficient documentation

## 2023-12-09 DIAGNOSIS — H1013 Acute atopic conjunctivitis, bilateral: Secondary | ICD-10-CM

## 2023-12-09 DIAGNOSIS — R739 Hyperglycemia, unspecified: Secondary | ICD-10-CM

## 2023-12-09 DIAGNOSIS — I10 Essential (primary) hypertension: Secondary | ICD-10-CM | POA: Diagnosis not present

## 2023-12-09 MED ORDER — HYDROCODONE BIT-HOMATROP MBR 5-1.5 MG/5ML PO SOLN: 5.0000 mL | Freq: Four times a day (QID) | ORAL | 0 refills | Status: AC | PRN

## 2023-12-09 MED ORDER — LEVOFLOXACIN 500 MG PO TABS: 500.0000 mg | ORAL_TABLET | Freq: Every day | ORAL | 0 refills | Status: DC

## 2023-12-09 MED ORDER — PREDNISONE 10 MG PO TABS: ORAL_TABLET | ORAL | 0 refills | Status: DC

## 2023-12-09 NOTE — Progress Notes (Signed)
 Patient ID: Denise [name], female   DOB: 1975/12/25, 48 y.o.   MRN: [id number]        Chief Complaint: follow up sinusitis, allergies, htn, hyperglycemia       HPI:  Denise [name] is a 48 y.o. female  Here with 2-3 days acute onset fever, facial pain, pressure, headache, general weakness and malaise, and greenish d/c, with mild ST and cough, but pt denies chest pain, wheezing, increased sob or doe, orthopnea, PND, increased LE swelling, palpitations, dizziness or syncope.   Pt denies polydipsia, polyuria, or new focal neuro s/s.    Pt denies polydipsia, polyuria, or new focal neuro s/s.         Wt Readings from Last 3 Encounters:  12/09/23 139 lb 12.8 oz (63.4 kg)  10/14/23 139 lb 6 oz (63.2 kg)  10/09/23 140 lb 4.8 oz (63.6 kg)   BP Readings from Last 3 Encounters:  12/09/23 112/64  10/14/23 106/60  10/09/23 118/70         Past Medical History:  Diagnosis Date   Allergy     Anemia    during pregnancy    Chronic headaches    Colon polyps    Complication of anesthesia    crying excessive spitting   Concussion age 49 or 40 no residual   Family history of adverse reaction to anesthesia    ponv   GERD (gastroesophageal reflux disease)    GERD (gastroesophageal reflux disease)    Gilbert's syndrome    Heart murmur 06/26/2013   HSV-2 infection    2008   Hyperlipidemia 2021   Hypertension    IBS (irritable bowel syndrome)    IC (interstitial cystitis)    2008   Migraine    Urticaria    Wears glasses    Past Surgical History:  Procedure Laterality Date   ADENOIDECTOMY  as child   BREAST SURGERY  2002   right breast biopsy Benign   CESAREAN SECTION N/A 08/30/2013   Procedure: CESAREAN SECTION;  Surgeon: Dickie DELENA Carder, MD;  Location: [facility];  Service: Obstetrics;  Laterality: N/A;   colonscopy  04/18/2020   and endoscopy done   DILATION AND EVACUATION N/A 06/23/2020   Procedure: DILATATION AND EVACUATION;  Surgeon: Cleotilde Ronal RAMAN, MD;  Location: [facility];   Service: Gynecology;  Laterality: N/A;   KNEE ARTHROSCOPY  3/11, 2012   right knee   SHOULDER ARTHROSCOPY Right 2010   TONSILLECTOMY  as child   and adenoids   WISDOM TOOTH EXTRACTION  2008    reports that she has never smoked. She has never used smokeless tobacco. She reports that she does not currently use alcohol. She reports that she does not use drugs. family history includes Anemia in her maternal aunt and mother; Asthma in her son; Cancer in her maternal aunt, maternal grandfather, and maternal uncle; Colon polyps in her father; Diabetes in her maternal grandmother, mother, paternal aunt, and paternal grandmother; Hearing loss in her sister; Heart disease in her brother, father, paternal aunt, and paternal grandfather; Heart failure in her maternal grandmother; Hyperlipidemia in her father, mother, and sister; Hypertension in her father, sister, and sister; Irritable bowel syndrome in her mother; Kidney disease in her father, maternal uncle, and sister; Miscarriages / Stillbirths in her sister; Prostate cancer in her maternal grandfather and paternal uncle; Renal Disease in her paternal grandfather; Stroke in her paternal grandfather, paternal grandmother, and sister; Thyroid  disease in her sister. Allergies  Allergen Reactions   Ace  Inhibitors     Swelling og tongue and lips   Adhesive [Tape] Rash   Augmentin  [Amoxicillin -Pot Clavulanate] Rash    Rash on lower legs   Latex Itching and Rash    Rash and itching from use of gloves   Current Outpatient Medications on File Prior to Visit  Medication Sig Dispense Refill   azelastine  (ASTELIN ) 0.1 % nasal spray Place 1-2 sprays into both nostrils 2 (two) times daily as needed (nasal drainage). Use in each nostril as directed 30 mL 5   cetirizine  (ZYRTEC  ALLERGY ) 10 MG tablet May take 10mg  1-2 timer per day for hives and allergies. 60 tablet 5   cromolyn  (OPTICROM ) 4 % ophthalmic solution Place 1 drop into both eyes 4 (four) times daily as  needed (itchy/watery eyes). 10 mL 5   famotidine  (PEPCID ) 20 MG tablet Take 1 tablet (20 mg total) by mouth 2 (two) times daily as needed for heartburn or indigestion. 60 tablet 2   fluticasone  (FLONASE ) 50 MCG/ACT nasal spray Place 1-2 sprays into both nostrils daily as needed (nasal congestion). 16 g 5   hydrocortisone  2.5 % cream Apply topically 2 (two) times daily. 30 g 0   hydrocortisone -pramoxine (PROCTOFOAM-HC) rectal foam Place 1 applicator rectally 2 (two) times daily. 10 g 2   indapamide  (LOZOL ) 1.25 MG tablet Take 1 tablet (1.25 mg total) by mouth daily. 90 tablet 2   lisdexamfetamine (VYVANSE ) 30 MG capsule Take 1 capsule (30 mg total) by mouth daily. 30 capsule 0   medroxyPROGESTERone  (PROVERA ) 10 MG tablet Take 1 tablet (10 mg total) by mouth daily. 10 tablet 0   NIFEdipine  (PROCARDIA  XL/NIFEDICAL XL) 60 MG 24 hr tablet Take 1 tablet (60 mg total) by mouth daily. 90 tablet 2   omeprazole  (PRILOSEC) 40 MG capsule Take 1 capsule (40 mg total) by mouth daily. 90 capsule 1   potassium chloride  SA (KLOR-CON  M) 20 MEQ tablet Take 1 tablet (20 mEq total) by mouth daily. 90 tablet 3   Prenatal Vit-Fe Fumarate-FA (PRENATAL MULTIVITAMIN) TABS tablet Take 1 tablet by mouth daily at 12 noon.     tiZANidine  (ZANAFLEX ) 4 MG tablet Take 1 tablet (4 mg total) by mouth at bedtime. 30 tablet 3   traZODone  (DESYREL ) 50 MG tablet Take 1 tablet (50 mg total) by mouth at bedtime. 30 tablet 2   TRULANCE  3 MG TABS Take 1 tablet (3 mg total) by mouth daily. 30 tablet 2   valACYclovir  (VALTREX ) 1000 MG tablet Take 1 tablet (1,000 mg total) by mouth daily. 90 tablet 4   No current facility-administered medications on file prior to visit.        ROS:  All others reviewed and negative.  Objective        PE:  BP 112/64   Pulse 100   Temp 98.5 F (36.9 C)   Ht 5' 3 (1.6 m)   Wt 139 lb 12.8 oz (63.4 kg)   SpO2 98%   BMI 24.76 kg/m                 Constitutional: Pt appears in NAD               HENT:  Head: NCAT.                Right Ear: External ear normal.                 Left Ear: External ear normal. Bilat tm's with mild erythema.  Max sinus  areas mild tender.  Pharynx with mild erythema, no exudate               Eyes: . Pupils are equal, round, and reactive to light. Conjunctivae and EOM are normal               Nose: without d/c or deformity               Neck: Neck supple. Gross normal ROM               Cardiovascular: Normal rate and regular rhythm.                 Pulmonary/Chest: Effort normal and breath sounds without rales or wheezing.                              Neurological: Pt is alert. At baseline orientation, motor grossly intact               Skin: Skin is warm. No rashes, no other new lesions, LE edema - none               Psychiatric: Pt behavior is normal without agitation   Micro: none  Cardiac tracings I have personally interpreted today:  none  Pertinent Radiological findings (summarize): none   Lab Results  Component Value Date   WBC 4.5 02/06/2023   HGB 14.3 02/06/2023   HCT 42.0 02/06/2023   PLT 263.0 02/06/2023   GLUCOSE 87 08/06/2023   CHOL 235 (H) 08/06/2023   TRIG 51.0 08/06/2023   HDL 89.20 08/06/2023   LDLCALC 136 (H) 08/06/2023   ALT 23 08/06/2023   AST 29 08/06/2023   NA 139 08/06/2023   K 3.3 (L) 08/06/2023   CL 98 08/06/2023   CREATININE 0.73 08/06/2023   BUN 13 08/06/2023   CO2 33 (H) 08/06/2023   TSH 1.900 07/26/2023   INR 1.0 07/26/2020   HGBA1C 5.4 08/06/2023   Assessment/Plan:  Denise [name] is a 48 y.o. Black or African American [2] female with  has a past medical history of Allergy , Anemia, Chronic headaches, Colon polyps, Complication of anesthesia, Concussion (age 62 or 5 no residual), Family history of adverse reaction to anesthesia, GERD (gastroesophageal reflux disease), GERD (gastroesophageal reflux disease), Gilbert's syndrome, Heart murmur (06/26/2013), HSV-2 infection, Hyperlipidemia (2021), Hypertension, IBS  (irritable bowel syndrome), IC (interstitial cystitis), Migraine, Urticaria, and Wears glasses.  Hyperglycemia Lab Results  Component Value Date   HGBA1C 5.4 08/06/2023   Stable, pt to continue current medical treatment - diet, wt control   Essential hypertension, benign BP Readings from Last 3 Encounters:  12/09/23 112/64  10/14/23 106/60  10/09/23 118/70   Stable, pt to continue medical treatment lozol  1.25 every day, proc xl 60 qd   Allergic conjunctivitis of both eyes Mild to mod, for prednisone  taper,  to f/u any worsening symptoms or concerns   Acute sinus infection Mild to mod, for antibx course levaquin  500 every day, cough med prn,  to f/u any worsening symptoms or concerns  Followup: Return if symptoms worsen or fail to improve.  Lynwood Rush, MD 12/09/2023 4:50 PM [facility]  Primary Care - [facility]

## 2023-12-09 NOTE — Assessment & Plan Note (Signed)
 Mild to mod, for prednisone taper, to f/u any worsening symptoms or concerns

## 2023-12-09 NOTE — Patient Instructions (Signed)
Please take all new medication as prescribed - the antibiotic, cough medicine, and prednisone ° °Please continue all other medications as before, and refills have been done if requested. ° °Please have the pharmacy call with any other refills you may need. ° °Please keep your appointments with your specialists as you may have planned ° ° ° °

## 2023-12-09 NOTE — Telephone Encounter (Signed)
 FYI Only or Action Required?: FYI only for provider.  Patient was last seen in primary care on 08/16/2023 by Geofm Glade PARAS, MD.  Called Nurse Triage reporting Facial Pain.  Symptoms began several days ago.  Interventions attempted: Nothing.  Symptoms are: gradually worsening.  Triage Disposition: No disposition on file.  Patient/caregiver understands and will follow disposition?:   Apt today  Copied from CRM [id number]. Topic: Clinical - Red Word Triage >> Dec 09, 2023  2:11 PM Drema MATSU wrote: Red Word that prompted transfer to Nurse Triage: Patient has yellowish/green mucous, pressure in face, thick mucous, blood while blowing nose, and bad cough at night. Reason for Disposition  [1] Sinus congestion (pressure, fullness) AND [2] present > 10 days  Answer Assessment - Initial Assessment Questions 1. LOCATION: Where does it hurt?      Under eyes and nose area and middle of nose 2. ONSET: When did the sinus pain start?  (e.g., hours, days)      Tuesday evening, started with sore throat and post nasal drip 3. SEVERITY: How bad is the pain?   (Scale 0-10; or none, mild, moderate or severe)     5-6/10 4. RECURRENT SYMPTOM: Have you ever had sinus problems before? If Yes, ask: When was the last time? and What happened that time?      yes 5. NASAL CONGESTION: Is the nose blocked? If Yes, ask: Can you open it or must you breathe through your mouth?     Yellowish green 6. NASAL DISCHARGE: Do you have discharge from your nose? If so ask, What color?     Yellowish green 7. FEVER: Do you have a fever? If Yes, ask: What is it, how was it measured, and when did it start?      no 8. OTHER SYMPTOMS: Do you have any other symptoms? (e.g., sore throat, cough, earache, difficulty breathing)     Sore throat, cough, worse at night, clogged ear on right side 9. PREGNANCY: Is there any chance you are pregnant? When was your last menstrual period?     na  Protocols  used: Sinus Pain or Congestion-A-AH

## 2023-12-09 NOTE — Assessment & Plan Note (Signed)
 Lab Results  Component Value Date   HGBA1C 5.4 08/06/2023   Stable, pt to continue current medical treatment - diet, wt control

## 2023-12-09 NOTE — Assessment & Plan Note (Signed)
 BP Readings from Last 3 Encounters:  12/09/23 112/64  10/14/23 106/60  10/09/23 118/70   Stable, pt to continue medical treatment lozol  1.25 every day, proc xl 60 qd

## 2023-12-09 NOTE — Assessment & Plan Note (Signed)
Mild to mod, for antibx course levaquin 500 every day, cough med prn,  to f/u any worsening symptoms or concerns

## 2023-12-12 ENCOUNTER — Encounter: Payer: Self-pay | Admitting: Internal Medicine

## 2023-12-12 ENCOUNTER — Encounter (HOSPITAL_BASED_OUTPATIENT_CLINIC_OR_DEPARTMENT_OTHER): Payer: Self-pay | Admitting: Obstetrics & Gynecology

## 2023-12-12 ENCOUNTER — Other Ambulatory Visit (HOSPITAL_BASED_OUTPATIENT_CLINIC_OR_DEPARTMENT_OTHER): Payer: Self-pay | Admitting: Obstetrics & Gynecology

## 2023-12-12 ENCOUNTER — Other Ambulatory Visit: Payer: Self-pay | Admitting: Family Medicine

## 2023-12-12 DIAGNOSIS — N951 Menopausal and female climacteric states: Secondary | ICD-10-CM

## 2023-12-12 MED ORDER — LISDEXAMFETAMINE DIMESYLATE 30 MG PO CAPS: 30.0000 mg | ORAL_CAPSULE | Freq: Every day | ORAL | 0 refills | Status: DC

## 2023-12-12 MED ORDER — MEDROXYPROGESTERONE ACETATE 10 MG PO TABS
10.0000 mg | ORAL_TABLET | Freq: Every day | ORAL | 0 refills | Status: DC
Start: 2023-12-12 — End: 2023-12-18

## 2023-12-18 ENCOUNTER — Other Ambulatory Visit (HOSPITAL_COMMUNITY)
Admission: RE | Admit: 2023-12-18 | Discharge: 2023-12-18 | Disposition: A | Discharge status: Home / Self Care | Source: Ambulatory Visit | Attending: Obstetrics & Gynecology | Admitting: Obstetrics & Gynecology

## 2023-12-18 ENCOUNTER — Ambulatory Visit (HOSPITAL_BASED_OUTPATIENT_CLINIC_OR_DEPARTMENT_OTHER): Admitting: Obstetrics & Gynecology

## 2023-12-18 ENCOUNTER — Encounter (HOSPITAL_BASED_OUTPATIENT_CLINIC_OR_DEPARTMENT_OTHER): Payer: Self-pay | Admitting: Obstetrics & Gynecology

## 2023-12-18 VITALS — BP 128/78 | HR 100 | Ht 64.0 in | Wt 142.2 lb

## 2023-12-18 DIAGNOSIS — N898 Other specified noninflammatory disorders of vagina: Secondary | ICD-10-CM | POA: Insufficient documentation

## 2023-12-18 DIAGNOSIS — N912 Amenorrhea, unspecified: Secondary | ICD-10-CM | POA: Diagnosis not present

## 2023-12-18 MED ORDER — ESTRADIOL 0.5 MG PO TABS: ORAL_TABLET | ORAL | 3 refills | Status: DC

## 2023-12-18 MED ORDER — PROGESTERONE 200 MG PO CAPS: ORAL_CAPSULE | ORAL | 3 refills | Status: DC

## 2023-12-18 MED ORDER — ESTRADIOL 0.1 MG/GM VA CREA: TOPICAL_CREAM | VAGINAL | 1 refills | Status: DC

## 2023-12-18 NOTE — Progress Notes (Signed)
 GYNECOLOGY  VISIT  CC:   amenorrhea  HPI: 48 y.o. G62P1021 Married [place] American female here for discussion of menstrual cycles.  She had FSH in February with results showing 78.   She had been on antibiotics and steroids due to sinusitis.  Will now start provera  for withdrawal bleeding.  Having hot flashes but they are mild and night sweats.    She has experienced yellowish vaginal discharge that did have some odor to it.  It has improved at this point.  Would like evaluation.    Past Medical History:  Diagnosis Date   Allergy     Anemia    during pregnancy    Chronic headaches    Colon polyps    Complication of anesthesia    crying excessive spitting   Concussion age 70 or 69 no residual   Family history of adverse reaction to anesthesia    ponv   GERD (gastroesophageal reflux disease)    GERD (gastroesophageal reflux disease)    Gilbert's syndrome    Heart murmur 06/26/2013   HSV-2 infection    2008   Hyperlipidemia 2021   Hypertension    IBS (irritable bowel syndrome)    IC (interstitial cystitis)    2008   Migraine    Urticaria    Wears glasses     MEDS:   Current Outpatient Medications on File Prior to Visit  Medication Sig Dispense Refill   azelastine  (ASTELIN ) 0.1 % nasal spray Place 1-2 sprays into both nostrils 2 (two) times daily as needed (nasal drainage). Use in each nostril as directed 30 mL 5   cetirizine  (ZYRTEC  ALLERGY ) 10 MG tablet May take 10mg  1-2 timer per day for hives and allergies. 60 tablet 5   cromolyn  (OPTICROM ) 4 % ophthalmic solution Place 1 drop into both eyes 4 (four) times daily as needed (itchy/watery eyes). 10 mL 5   famotidine  (PEPCID ) 20 MG tablet Take 1 tablet (20 mg total) by mouth 2 (two) times daily as needed for heartburn or indigestion. 60 tablet 2   fluticasone  (FLONASE ) 50 MCG/ACT nasal spray Place 1-2 sprays into both nostrils daily as needed (nasal congestion). 16 g 5   HYDROcodone  bit-homatropine (HYCODAN) 5-1.5  MG/5ML syrup Take 5 mLs by mouth every 6 (six) hours as needed for up to 10 days. 180 mL 0   hydrocortisone  2.5 % cream Apply topically 2 (two) times daily. 30 g 0   indapamide  (LOZOL ) 1.25 MG tablet Take 1 tablet (1.25 mg total) by mouth daily. 90 tablet 2   lisdexamfetamine (VYVANSE ) 30 MG capsule Take 1 capsule (30 mg total) by mouth daily. 30 capsule 0   NIFEdipine  (PROCARDIA  XL/NIFEDICAL XL) 60 MG 24 hr tablet Take 1 tablet (60 mg total) by mouth daily. 90 tablet 2   omeprazole  (PRILOSEC) 40 MG capsule Take 1 capsule (40 mg total) by mouth daily. 90 capsule 1   potassium chloride  SA (KLOR-CON  M) 20 MEQ tablet Take 1 tablet (20 mEq total) by mouth daily. 90 tablet 3   Prenatal Vit-Fe Fumarate-FA (PRENATAL MULTIVITAMIN) TABS tablet Take 1 tablet by mouth daily at 12 noon.     tiZANidine  (ZANAFLEX ) 4 MG tablet Take 1 tablet (4 mg total) by mouth at bedtime. 30 tablet 3   traZODone  (DESYREL ) 50 MG tablet Take 1 tablet (50 mg total) by mouth at bedtime. 30 tablet 2   TRULANCE  3 MG TABS Take 1 tablet (3 mg total) by mouth daily. 30 tablet 2  valACYclovir  (VALTREX ) 1000 MG tablet Take 1 tablet (1,000 mg total) by mouth daily. 90 tablet 4   hydrocortisone -pramoxine (PROCTOFOAM-HC) rectal foam Place 1 applicator rectally 2 (two) times daily. 10 g 2   levofloxacin  (LEVAQUIN ) 500 MG tablet Take 1 tablet (500 mg total) by mouth daily. (Patient not taking: Reported on 12/18/2023) 10 tablet 0   medroxyPROGESTERone  (PROVERA ) 10 MG tablet Take 1 tablet (10 mg total) by mouth daily. (Patient not taking: Reported on 12/18/2023) 10 tablet 0   predniSONE  (DELTASONE ) 10 MG tablet 3 tabs by mouth per day for 3 days,2tabs per day for 3 days,1tab per day for 3 days (Patient not taking: Reported on 12/18/2023) 18 tablet 0   No current facility-administered medications on file prior to visit.    ALLERGIES: Ace inhibitors, Adhesive [tape], Augmentin  [amoxicillin -pot clavulanate], and Latex  SH:  married, non  smoker  Review of Systems  Constitutional: Negative.   Genitourinary:        Amenorhea Vaginal dryness Vaginal discharge    PHYSICAL EXAMINATION:    BP 128/78   Pulse 100   Ht 5' 4 (1.626 m)   Wt 142 lb 3.2 oz (64.5 kg)   BMI 24.41 kg/m     General appearance: alert, cooperative and appears stated age   Lymph:  no inguinal LAD noted Pelvic: External genitalia:  no lesions              Urethra:  normal appearing urethra with no masses, tenderness or lesions              Bartholins and Skenes: normal                 Vagina: normal mucosa without prolapse or lesions              Cervix: no lesions              Bimanual Exam:  Uterus:  normal size, contour, position, consistency, mobility, non-tender              Adnexa: no mass, fullness, tenderness              Chaperone was was present for exam.  Assessment/Plan: 1. Amenorrhea (Primary) - options for treatment discussed.  Will start HRT with estradiol  0.5mg  and progesterone  200mg  days 1-15 each month.  Advised pt could have bleeding so will take progesterone  1/2 of each month.  If no bleeding over next three to four months, can switch to nightly progesterone . - estradiol  (ESTRACE ) 0.5 MG tablet; Take 1 tablet BID  Dispense: 60 tablet; Refill: 3 - progesterone  (PROMETRIUM ) 200 MG capsule; Take 1 capsule nightly days 1 - 15 each month.  Dispense: 15 capsule; Refill: 3  2. Vaginal discharge - will test for vaginitis - Cervicovaginal ancillary only( [place])  3. Vaginal dryness - options reviewed.  Feel estradiol  cream is appropriate.  Dosing discussed. - estradiol  (ESTRACE ) 0.1 MG/GM vaginal cream; 1 gram vaginally twice weekly  Dispense: 42.5 g; Refill: 1

## 2023-12-19 ENCOUNTER — Ambulatory Visit (HOSPITAL_BASED_OUTPATIENT_CLINIC_OR_DEPARTMENT_OTHER): Payer: Self-pay | Admitting: Obstetrics & Gynecology

## 2023-12-19 LAB — CERVICOVAGINAL ANCILLARY ONLY
Bacterial Vaginitis (gardnerella): NEGATIVE
Candida Glabrata: NEGATIVE
Candida Vaginitis: NEGATIVE
Comment: NEGATIVE
Comment: NEGATIVE
Comment: NEGATIVE

## 2024-01-06 ENCOUNTER — Other Ambulatory Visit (HOSPITAL_BASED_OUTPATIENT_CLINIC_OR_DEPARTMENT_OTHER): Payer: Self-pay | Admitting: Certified Nurse Midwife

## 2024-01-06 ENCOUNTER — Encounter (HOSPITAL_BASED_OUTPATIENT_CLINIC_OR_DEPARTMENT_OTHER): Payer: Self-pay | Admitting: Obstetrics & Gynecology

## 2024-01-06 DIAGNOSIS — N912 Amenorrhea, unspecified: Secondary | ICD-10-CM

## 2024-01-06 MED ORDER — PROGESTERONE MICRONIZED 100 MG PO CAPS: ORAL_CAPSULE | ORAL | 3 refills | Status: DC

## 2024-01-08 DIAGNOSIS — Z1231 Encounter for screening mammogram for malignant neoplasm of breast: Secondary | ICD-10-CM | POA: Diagnosis not present

## 2024-01-14 ENCOUNTER — Encounter: Payer: Self-pay | Admitting: Internal Medicine

## 2024-01-14 MED ORDER — LISDEXAMFETAMINE DIMESYLATE 30 MG PO CAPS: 30.0000 mg | ORAL_CAPSULE | Freq: Every day | ORAL | 0 refills | Status: DC

## 2024-01-29 ENCOUNTER — Encounter (HOSPITAL_BASED_OUTPATIENT_CLINIC_OR_DEPARTMENT_OTHER): Payer: Self-pay | Admitting: Obstetrics & Gynecology

## 2024-02-04 ENCOUNTER — Other Ambulatory Visit: Payer: Self-pay | Admitting: Internal Medicine

## 2024-02-05 ENCOUNTER — Encounter: Payer: Self-pay | Admitting: Internal Medicine

## 2024-02-05 NOTE — Progress Notes (Unsigned)
 Subjective:    Patient ID: Denise [name], female    DOB: 09/30/1975, 48 y.o.   MRN: [id number]     HPI Denise [name] is here for follow up of her chronic medical problems.  Rash in b/l axilla - started last month - intermittent.  She puts alcohol on it at times.  Will not use deo some days.  No new products.  Area feels irritated.    Right eye irritated - mucus in eye, around eye is tender.  Has had chalazion in upper lid in past.  There is a slight bump there.  Has been applying warm compresses  Not exercising.    Medications and allergies reviewed with patient and updated if appropriate.  Current Outpatient Medications on File Prior to Visit  Medication Sig Dispense Refill   azelastine  (ASTELIN ) 0.1 % nasal spray Place 1-2 sprays into both nostrils 2 (two) times daily as needed (nasal drainage). Use in each nostril as directed 30 mL 5   cetirizine  (ZYRTEC  ALLERGY ) 10 MG tablet May take 10mg  1-2 timer per day for hives and allergies. 60 tablet 5   cromolyn  (OPTICROM ) 4 % ophthalmic solution Place 1 drop into both eyes 4 (four) times daily as needed (itchy/watery eyes). 10 mL 5   estradiol  (ESTRACE ) 0.1 MG/GM vaginal cream 1 gram vaginally twice weekly 42.5 g 1   estradiol  (ESTRACE ) 0.5 MG tablet Take 1 tablet BID 60 tablet 3   famotidine  (PEPCID ) 20 MG tablet Take 1 tablet (20 mg total) by mouth 2 (two) times daily as needed for heartburn or indigestion. 60 tablet 2   fluticasone  (FLONASE ) 50 MCG/ACT nasal spray Place 1-2 sprays into both nostrils daily as needed (nasal congestion). 16 g 5   hydrocortisone  2.5 % cream Apply topically 2 (two) times daily. 30 g 0   hydrocortisone -pramoxine (PROCTOFOAM-HC) rectal foam Place 1 applicator rectally 2 (two) times daily. 10 g 2   indapamide  (LOZOL ) 1.25 MG tablet Take 1 tablet (1.25 mg total) by mouth daily. 90 tablet 2   NIFEdipine  (PROCARDIA  XL/NIFEDICAL XL) 60 MG 24 hr tablet TAKE 1 TABLET BY MOUTH DAILY 90 tablet 2   omeprazole   (PRILOSEC) 40 MG capsule Take 1 capsule (40 mg total) by mouth daily. 90 capsule 1   Prenatal Vit-Fe Fumarate-FA (PRENATAL MULTIVITAMIN) TABS tablet Take 1 tablet by mouth daily at 12 noon.     progesterone  (PROMETRIUM ) 100 MG capsule Take 1 capsule nightly days 1 - 15 each month. 90 capsule 3   tiZANidine  (ZANAFLEX ) 4 MG tablet Take 1 tablet (4 mg total) by mouth at bedtime. 30 tablet 3   TRULANCE  3 MG TABS Take 1 tablet (3 mg total) by mouth daily. 30 tablet 2   valACYclovir  (VALTREX ) 1000 MG tablet Take 1 tablet (1,000 mg total) by mouth daily. 90 tablet 4   No current facility-administered medications on file prior to visit.     Review of Systems  Constitutional:  Negative for fever.  Respiratory:  Negative for cough, shortness of breath and wheezing.   Cardiovascular:  Negative for chest pain, palpitations and leg swelling.  Neurological:  Positive for light-headedness and headaches.       Objective:   Vitals:   02/06/24 1024  BP: 118/70  Pulse: 82  Temp: 98.3 F (36.8 C)  SpO2: 99%   BP Readings from Last 3 Encounters:  02/06/24 118/70  12/18/23 128/78  12/09/23 112/64   Wt Readings from Last 3 Encounters:  02/06/24 139 lb (63  kg)  12/18/23 142 lb 3.2 oz (64.5 kg)  12/09/23 139 lb 12.8 oz (63.4 kg)   Body mass index is 24.02 kg/m.    Physical Exam Constitutional:      General: She is not in acute distress.    Appearance: Normal appearance.  HENT:     Head: Normocephalic and atraumatic.  Eyes:     Conjunctiva/sclera: Conjunctivae normal.  Cardiovascular:     Rate and Rhythm: Normal rate and regular rhythm.     Heart sounds: Normal heart sounds.  Pulmonary:     Effort: Pulmonary effort is normal. No respiratory distress.     Breath sounds: Normal breath sounds. No wheezing.  Musculoskeletal:     Cervical back: Neck supple.     Right lower leg: No edema.     Left lower leg: No edema.  Lymphadenopathy:     Cervical: No cervical adenopathy.  Skin:     General: Skin is warm and dry.     Findings: Rash (mild - rash - irritation b/l lateral axilla to arm) present.  Neurological:     Mental Status: She is alert. Mental status is at baseline.  Psychiatric:        Mood and Affect: Mood normal.        Behavior: Behavior normal.        Lab Results  Component Value Date   WBC 4.5 02/06/2023   HGB 14.3 02/06/2023   HCT 42.0 02/06/2023   PLT 263.0 02/06/2023   GLUCOSE 87 08/06/2023   CHOL 235 (H) 08/06/2023   TRIG 51.0 08/06/2023   HDL 89.20 08/06/2023   LDLCALC 136 (H) 08/06/2023   ALT 23 08/06/2023   AST 29 08/06/2023   NA 139 08/06/2023   K 3.3 (L) 08/06/2023   CL 98 08/06/2023   CREATININE 0.73 08/06/2023   BUN 13 08/06/2023   CO2 33 (H) 08/06/2023   TSH 1.900 07/26/2023   INR 1.0 07/26/2020   HGBA1C 5.4 08/06/2023     Assessment & Plan:    See Problem List for Assessment and Plan of chronic medical problems.

## 2024-02-05 NOTE — Patient Instructions (Addendum)
      Blood work was ordered.       Medications changes include :   clotrimazole  cream twice daily as needed to armpit.  Use cortisone cream otc a few days in that area if needed.       Return in about 6 months (around 08/05/2024) for Physical Exam.

## 2024-02-06 ENCOUNTER — Ambulatory Visit: Admitting: Internal Medicine

## 2024-02-06 VITALS — BP 118/70 | HR 82 | Temp 98.3°F | Ht 63.78 in | Wt 139.0 lb

## 2024-02-06 DIAGNOSIS — R21 Rash and other nonspecific skin eruption: Secondary | ICD-10-CM

## 2024-02-06 DIAGNOSIS — T502X5A Adverse effect of carbonic-anhydrase inhibitors, benzothiadiazides and other diuretics, initial encounter: Secondary | ICD-10-CM | POA: Diagnosis not present

## 2024-02-06 DIAGNOSIS — E782 Mixed hyperlipidemia: Secondary | ICD-10-CM

## 2024-02-06 DIAGNOSIS — Z79890 Hormone replacement therapy (postmenopausal): Secondary | ICD-10-CM

## 2024-02-06 DIAGNOSIS — R739 Hyperglycemia, unspecified: Secondary | ICD-10-CM

## 2024-02-06 DIAGNOSIS — E876 Hypokalemia: Secondary | ICD-10-CM | POA: Diagnosis not present

## 2024-02-06 DIAGNOSIS — G43109 Migraine with aura, not intractable, without status migrainosus: Secondary | ICD-10-CM | POA: Diagnosis not present

## 2024-02-06 DIAGNOSIS — R41840 Attention and concentration deficit: Secondary | ICD-10-CM | POA: Diagnosis not present

## 2024-02-06 DIAGNOSIS — N951 Menopausal and female climacteric states: Secondary | ICD-10-CM

## 2024-02-06 DIAGNOSIS — I10 Essential (primary) hypertension: Secondary | ICD-10-CM | POA: Diagnosis not present

## 2024-02-06 LAB — LIPID PANEL
Cholesterol: 239 mg/dL — ABNORMAL HIGH (ref 0–200)
HDL: 93.4 mg/dL (ref >39.00)
LDL Cholesterol: 135 mg/dL — ABNORMAL HIGH (ref 0–99)
NonHDL: 145.86
Total CHOL/HDL Ratio: 3
Triglycerides: 55 mg/dL (ref 0.0–149.0)
VLDL: 11 mg/dL (ref 0.0–40.0)

## 2024-02-06 LAB — COMPREHENSIVE METABOLIC PANEL WITH GFR
ALT: 18 U/L (ref 0–35)
AST: 20 U/L (ref 0–37)
Albumin: 4.6 g/dL (ref 3.5–5.2)
Alkaline Phosphatase: 72 U/L (ref 39–117)
BUN: 13 mg/dL (ref 6–23)
CO2: 30 meq/L (ref 19–32)
Calcium: 10.1 mg/dL (ref 8.4–10.5)
Chloride: 99 meq/L (ref 96–112)
Creatinine, Ser: 0.85 mg/dL (ref 0.40–1.20)
GFR: 82.13 mL/min (ref >60.00)
Glucose, Bld: 84 mg/dL (ref 70–99)
Potassium: 3.2 meq/L — ABNORMAL LOW (ref 3.5–5.1)
Sodium: 139 meq/L (ref 135–145)
Total Bilirubin: 1.5 mg/dL — ABNORMAL HIGH (ref 0.2–1.2)
Total Protein: 7.2 g/dL (ref 6.0–8.3)

## 2024-02-06 LAB — HEMOGLOBIN A1C: Hgb A1c MFr Bld: 5.7 % (ref 4.6–6.5)

## 2024-02-06 MED ORDER — POTASSIUM CHLORIDE CRYS ER 20 MEQ PO TBCR: 20.0000 meq | EXTENDED_RELEASE_TABLET | Freq: Every day | ORAL | 3 refills | Status: AC

## 2024-02-06 MED ORDER — CLOTRIMAZOLE 1 % EX CREA: 1.0000 | TOPICAL_CREAM | Freq: Two times a day (BID) | CUTANEOUS | 0 refills | Status: AC

## 2024-02-06 MED ORDER — LISDEXAMFETAMINE DIMESYLATE 30 MG PO CAPS: 30.0000 mg | ORAL_CAPSULE | Freq: Every day | ORAL | 0 refills | Status: DC

## 2024-02-06 NOTE — Assessment & Plan Note (Signed)
 Management per gyn On MRT w/ estrace  0.5 mg daily, progesterone  100 mg daily and estradiol  cream

## 2024-02-06 NOTE — Assessment & Plan Note (Signed)
 Chronic Lab Results  Component Value Date   HGBA1C 5.4 08/06/2023   Check a1c Low sugar / carb diet Stressed regular exercise

## 2024-02-06 NOTE — Assessment & Plan Note (Signed)
 Acute B/l axilla - outer axilla to arm ? Intertrigo, yeast infection - not likely bacterial infection Apply alcohol daily which is likely drier out the skin  - advised to stop  for now Clotrimazole  cream bid If not improving try using otc cortisone 1% cream daily x few days only

## 2024-02-06 NOTE — Assessment & Plan Note (Addendum)
 Chronic Related to indapamide  CMP Continue potassium chloride  20 mEq daily as long as she is taking the indapamide  - has not been taking daily -- advised taking it daily

## 2024-02-06 NOTE — Assessment & Plan Note (Signed)
Chronic Controlled, Stable Continue Vyvanse 30 mg daily or take as needed

## 2024-02-06 NOTE — Assessment & Plan Note (Signed)
 Chronic Frequent migraines No longer seeing neurology Tizanidine  4 mg HS prn

## 2024-02-06 NOTE — Assessment & Plan Note (Signed)
 Chronic Lab Results  Component Value Date   LDLCALC 136 (H) 08/06/2023   Regular exercise and healthy diet encouraged Check lipid panel ASCVD risk is low Continue lifestyle control

## 2024-02-06 NOTE — Assessment & Plan Note (Signed)
 Chronic Blood pressure well controlled Continue indapamide  1.25 mg daily in addition to potassium, nifedipine  to 60 mg XL CMP

## 2024-02-09 ENCOUNTER — Ambulatory Visit: Payer: Self-pay | Admitting: Internal Medicine

## 2024-02-12 ENCOUNTER — Other Ambulatory Visit: Payer: Self-pay | Admitting: Internal Medicine

## 2024-02-20 ENCOUNTER — Encounter (HOSPITAL_BASED_OUTPATIENT_CLINIC_OR_DEPARTMENT_OTHER): Payer: Self-pay | Admitting: Obstetrics & Gynecology

## 2024-03-13 ENCOUNTER — Encounter: Payer: Self-pay | Admitting: Internal Medicine

## 2024-03-13 MED ORDER — LISDEXAMFETAMINE DIMESYLATE 30 MG PO CAPS: 30.0000 mg | ORAL_CAPSULE | Freq: Every day | ORAL | 0 refills | Status: DC

## 2024-04-06 ENCOUNTER — Encounter (HOSPITAL_BASED_OUTPATIENT_CLINIC_OR_DEPARTMENT_OTHER): Payer: Self-pay | Admitting: Obstetrics & Gynecology

## 2024-04-06 ENCOUNTER — Other Ambulatory Visit (HOSPITAL_COMMUNITY)
Admission: RE | Admit: 2024-04-06 | Discharge: 2024-04-06 | Disposition: A | Discharge status: Home / Self Care | Source: Ambulatory Visit | Attending: Obstetrics & Gynecology | Admitting: Obstetrics & Gynecology

## 2024-04-06 ENCOUNTER — Other Ambulatory Visit (HOSPITAL_BASED_OUTPATIENT_CLINIC_OR_DEPARTMENT_OTHER): Payer: Self-pay

## 2024-04-06 ENCOUNTER — Ambulatory Visit (HOSPITAL_BASED_OUTPATIENT_CLINIC_OR_DEPARTMENT_OTHER): Payer: Medicaid Other | Admitting: Obstetrics & Gynecology

## 2024-04-06 ENCOUNTER — Other Ambulatory Visit (HOSPITAL_BASED_OUTPATIENT_CLINIC_OR_DEPARTMENT_OTHER): Payer: Self-pay | Admitting: Obstetrics & Gynecology

## 2024-04-06 VITALS — BP 119/83 | HR 84 | Wt 137.8 lb

## 2024-04-06 DIAGNOSIS — Z1331 Encounter for screening for depression: Secondary | ICD-10-CM

## 2024-04-06 DIAGNOSIS — R9230 Dense breasts, unspecified: Secondary | ICD-10-CM

## 2024-04-06 DIAGNOSIS — Z124 Encounter for screening for malignant neoplasm of cervix: Secondary | ICD-10-CM | POA: Insufficient documentation

## 2024-04-06 DIAGNOSIS — Z23 Encounter for immunization: Secondary | ICD-10-CM

## 2024-04-06 DIAGNOSIS — Z01419 Encounter for gynecological examination (general) (routine) without abnormal findings: Secondary | ICD-10-CM

## 2024-04-06 DIAGNOSIS — N912 Amenorrhea, unspecified: Secondary | ICD-10-CM

## 2024-04-06 DIAGNOSIS — Z79890 Hormone replacement therapy (postmenopausal): Secondary | ICD-10-CM | POA: Diagnosis not present

## 2024-04-06 DIAGNOSIS — B009 Herpesviral infection, unspecified: Secondary | ICD-10-CM

## 2024-04-06 MED ORDER — ESTRADIOL 0.01 % VA CREA: TOPICAL_CREAM | VAGINAL | 5 refills | Status: AC

## 2024-04-06 MED ORDER — ESTRADIOL 0.5 MG PO TABS: 0.5000 mg | ORAL_TABLET | Freq: Every day | ORAL | 3 refills | Status: DC

## 2024-04-06 MED ORDER — NORETHINDRONE ACETATE 5 MG PO TABS: ORAL_TABLET | ORAL | 3 refills | Status: AC

## 2024-04-06 MED ORDER — VALACYCLOVIR HCL 1 G PO TABS: 1000.0000 mg | ORAL_TABLET | Freq: Every day | ORAL | 4 refills | Status: AC

## 2024-04-06 NOTE — Progress Notes (Signed)
 ANNUAL EXAM Patient name: Denise [name] MRN [id number]  Date of birth: 08/26/1975 Chief Complaint:   Gynecologic Exam  History of Present Illness:   Denise [name] is a 48 y.o. G81P1021 African-American female being seen today for a routine annual exam.  She is on HRT now.  Taking progesterone  days 1-15.  Taking estradiol  0.5mg  daily.  She is having some spotting on the off progesterone  days.  Vaginal dryness is much better.  Last week, she spotted again.  Tried the 200mg  prometrium  but this was too strong.  Taking 100mg  dosing.  Will switch to norethindrone  and she will start this in December.  If irregular spotting continued, will plan ultrasound.    Axiety screening elevated today.  She feels she is doing fairly well.  On Vyvanse  and doesn't want to change dosage.    LMP: 03/30/2024   Last pap 03/28/2023. Results were: NILM w/ HRHPV not done. H/O abnormal pap: no Last mammogram: 01/08/2024. Results were: normal. Family h/o breast cancer: yes maternal great aunt. Last colonoscopy: 10/29/2022. Results were: normal. Family h/o colorectal cancer: no.  Follow up 10 years.       04/06/2024    9:02 AM 12/18/2023   11:41 AM 08/06/2023   10:14 AM 03/28/2023   11:05 AM 02/06/2023   10:20 AM  Depression screen PHQ 2/9  Decreased Interest 1 0 0 0 1  Down, Depressed, Hopeless 1 0 0 0 1  PHQ - 2 Score 2 0 0 0 2  Altered sleeping 1    2  Tired, decreased energy 2    2  Change in appetite 1    1  Feeling bad or failure about yourself  1    1  Trouble concentrating 3    0  Moving slowly or fidgety/restless 0    1  Suicidal thoughts 0    0  PHQ-9 Score 10    9   Difficult doing work/chores Somewhat difficult    Somewhat difficult     Data saved with a previous flowsheet row definition        02/06/2023   10:21 AM 08/06/2022   10:22 AM 01/27/2021    4:42 PM  GAD 7 : Generalized Anxiety Score  Nervous, Anxious, on Edge 1 1 0  Control/stop worrying 2 1 1   Worry too much - different things 2 1  1   Trouble relaxing 1 1 1   Restless 1 0 0  Easily annoyed or irritable 1 1 1   Afraid - awful might happen 1 1 0  Total GAD 7 Score 9 6 4   Anxiety Difficulty Somewhat difficult Somewhat difficult Somewhat difficult     Review of Systems:   Pertinent items are noted in HPI Denies any urinary or bowel changes.  Pertinent History Reviewed:  Reviewed past medical,surgical, social and family history.  Reviewed problem list, medications and allergies. Physical Assessment:   Vitals:   04/06/24 0814  BP: 119/83  Pulse: 84  SpO2: 100%  Weight: 137 lb 12.8 oz (62.5 kg)  Body mass index is 23.82 kg/m.        Physical Examination:   General appearance - well appearing, and in no distress  Mental status - alert, oriented to person, place, and time  Psych:  She has a normal mood and affect  Skin - warm and dry, normal color, no suspicious lesions noted  Chest - effort normal, all lung fields clear to auscultation bilaterally  Heart - normal rate and regular rhythm  Neck:  midline trachea, no thyromegaly or nodules  Breasts - breasts appear normal, no suspicious masses, no skin or nipple changes or  axillary nodes  Abdomen - soft, nontender, nondistended, no masses or organomegaly  Pelvic - VULVA: normal appearing vulva with no masses, tenderness or lesions   VAGINA: normal appearing vagina with normal color and discharge, no lesions   CERVIX: normal appearing cervix without discharge or lesions, no CMT  Thin prep pap is updated today without HR HPV  UTERUS: uterus is felt to be normal size, shape, consistency and nontender   ADNEXA: No adnexal masses or tenderness noted.  Rectal - normal rectal, good sphincter tone, no masses felt.   Extremities:  No swelling or varicosities noted  Chaperone present for exam  No results found for this or any previous visit (from the past 24 hours).  Assessment & Plan:  1. Well woman exam with routine gynecological exam (Primary) - Pap smear  updated today.  Negative HR HPV updated last year.   - Mammogram 12/2023.  - Colonoscopy 2024.  Follow up 10 years. - lab work done with PCP, Dr. Geofm - vaccines reviewed/updated  2. HSV-2 infection - RF updated today - valACYclovir  (VALTREX ) 1000 MG tablet; Take 1 tablet (1,000 mg total) by mouth daily.  Dispense: 90 tablet; Refill: 4  3. Hormone replacement therapy (HRT) - will switch progesterone  due to the irregular spotting - norethindrone  (AYGESTIN ) 5 MG tablet; Take 1 tablet daily on days 1-15 each month  Dispense: 45 tablet; Refill: 3 - continue estradiol  (ESTRACE ) 0.5 MG tablet; Take 1 tablet (0.5 mg total) by mouth daily. Take 1 tablet BID  Dispense: 90 tablet; Refill: 3 - will plan ultrasound if the spotting continues  4.  Cervical cancer screening - Cytology - PAP( [facility])  5. Breast density - CEM discussed.  Pt would like to do this with [facility] next year.  Order will be faxed.      No orders of the defined types were placed in this encounter.   Meds:  Meds ordered this encounter  Medications   valACYclovir  (VALTREX ) 1000 MG tablet    Sig: Take 1 tablet (1,000 mg total) by mouth daily.    Dispense:  90 tablet    Refill:  4   norethindrone  (AYGESTIN ) 5 MG tablet    Sig: Take 1 tablet daily on days 1-15 each month    Dispense:  45 tablet    Refill:  3   estradiol  (ESTRACE ) 0.01 % CREA vaginal cream    Sig: 1 gram vaginally twice weekly    Dispense:  42.5 g    Refill:  5   estradiol  (ESTRACE ) 0.5 MG tablet    Sig: Take 1 tablet (0.5 mg total) by mouth daily. Take 1 tablet BID    Dispense:  90 tablet    Refill:  3    Follow-up: Return in about 1 year (around 04/06/2025).  Ronal GORMAN [place], MD 04/06/2024 9:04 AM

## 2024-04-07 LAB — CYTOLOGY - PAP
Adequacy: ABSENT
Diagnosis: NEGATIVE

## 2024-04-08 ENCOUNTER — Ambulatory Visit (HOSPITAL_BASED_OUTPATIENT_CLINIC_OR_DEPARTMENT_OTHER): Payer: Self-pay | Admitting: Obstetrics & Gynecology

## 2024-04-15 ENCOUNTER — Encounter: Payer: Self-pay | Admitting: Internal Medicine

## 2024-04-15 DIAGNOSIS — R41840 Attention and concentration deficit: Secondary | ICD-10-CM

## 2024-04-15 MED ORDER — LISDEXAMFETAMINE DIMESYLATE 30 MG PO CAPS: 30.0000 mg | ORAL_CAPSULE | Freq: Every day | ORAL | 0 refills | Status: DC

## 2024-04-24 ENCOUNTER — Other Ambulatory Visit (HOSPITAL_BASED_OUTPATIENT_CLINIC_OR_DEPARTMENT_OTHER): Payer: Self-pay | Admitting: Obstetrics & Gynecology

## 2024-04-24 DIAGNOSIS — N912 Amenorrhea, unspecified: Secondary | ICD-10-CM

## 2024-05-12 ENCOUNTER — Telehealth: Payer: Self-pay

## 2024-05-12 NOTE — Telephone Encounter (Signed)
 Pharmacy Patient Advocate Encounter   Received notification from CoverMyMeds that prior authorization for Trulance  3MG  tablets is required/requested.   Insurance verification completed.   The patient is insured through [employer].   Per test claim: PA required; PA submitted to above mentioned insurance via Latent Key/confirmation #/EOC [id number] Status is pending

## 2024-05-13 ENCOUNTER — Other Ambulatory Visit: Payer: Self-pay

## 2024-05-13 DIAGNOSIS — K5900 Constipation, unspecified: Secondary | ICD-10-CM

## 2024-05-13 DIAGNOSIS — R948 Abnormal results of function studies of other organs and systems: Secondary | ICD-10-CM

## 2024-05-13 MED ORDER — LUBIPROSTONE 24 MCG PO CAPS: 24.0000 ug | ORAL_CAPSULE | Freq: Two times a day (BID) | ORAL | 0 refills | Status: AC

## 2024-05-13 NOTE — Telephone Encounter (Addendum)
 Pt made aware of Alan Coombs PA recommendations: Prescription was sent to pt pharmacy. Pt made aware.  Referral to Physical Therapy was sent via Epic.  Pt made aware. Pt scheduled to see Alan Coombs PA on 06/02/2024 at 8:40 AM. Pt made aware.  Pt verbalized understanding with all questions answered.

## 2024-05-13 NOTE — Telephone Encounter (Signed)
 Pt was made aware of the denial.  Pt stated that she has been on the trulance  and did not have a problem prior receiving the medication. Pt stated that her insurance has not changed.  Please review and advise.  Thanks for all your help Rosina

## 2024-05-13 NOTE — Telephone Encounter (Signed)
 Effective February 26, 2024, Trulance  is no longer on the [facility] formulary.

## 2024-05-13 NOTE — Telephone Encounter (Signed)
 Dr. Federico Pt.  Please see notes below and advise.

## 2024-05-13 NOTE — Telephone Encounter (Signed)
 Pharmacy Patient Advocate Encounter  Received notification from [employer] that Prior Authorization for Trulance  3MG  tablets has been DENIED.  Full denial letter will be uploaded to the media tab. See denial reason below.  We do not cover the above-requested medication under this member's benefit plan.   PA #/Case ID/Reference #: [id number]

## 2024-05-14 ENCOUNTER — Encounter: Payer: Self-pay | Admitting: Internal Medicine

## 2024-05-14 MED ORDER — LISDEXAMFETAMINE DIMESYLATE 30 MG PO CAPS: 30.0000 mg | ORAL_CAPSULE | Freq: Every day | ORAL | 0 refills | Status: AC

## 2024-05-19 DIAGNOSIS — G479 Sleep disorder, unspecified: Secondary | ICD-10-CM | POA: Diagnosis not present

## 2024-05-19 DIAGNOSIS — F4310 Post-traumatic stress disorder, unspecified: Secondary | ICD-10-CM | POA: Diagnosis not present

## 2024-05-19 DIAGNOSIS — F909 Attention-deficit hyperactivity disorder, unspecified type: Secondary | ICD-10-CM | POA: Diagnosis not present

## 2024-05-19 DIAGNOSIS — F411 Generalized anxiety disorder: Secondary | ICD-10-CM | POA: Diagnosis not present

## 2024-05-19 DIAGNOSIS — F321 Major depressive disorder, single episode, moderate: Secondary | ICD-10-CM | POA: Diagnosis not present

## 2024-05-19 DIAGNOSIS — G8929 Other chronic pain: Secondary | ICD-10-CM | POA: Diagnosis not present

## 2024-05-29 ENCOUNTER — Encounter (HOSPITAL_BASED_OUTPATIENT_CLINIC_OR_DEPARTMENT_OTHER): Payer: Self-pay | Admitting: Obstetrics & Gynecology

## 2024-06-01 NOTE — Progress Notes (Signed)
 "    06/02/2024 Denise [name] [id number] 15-May-1976  Referring provider: Geofm Glade PARAS, MD Primary GI doctor: Dr. 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 Intermittent severe left upper quadrant pain possibly due to stool burden or muscular etiology. - Initiated Amitiza  24 mcg twice daily post bowel purge. - Recommended bowel purge prior to Amitiza  initiation. - Advised nightly heating pad for 20 minutes and Voltaren gel to abdomen for one week. - Encouraged continuation of pelvic floor physical therapy. - Educated on screening vs. diagnostic colonoscopy in constipation context. -Information given about IBS  GERD 10/29/2022 EGD - Normal esophagus. Biopsied. - Gastritis. Biopsied. - Normal examined duodenum. 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 - Offered to communicate with prior gastroenterologist regarding surveillance interval and concerns. - Reassured regarding low malignancy risk. - Advised to report new alarm symptoms for earlier diagnostic colonoscopy.  Patient Care Team: Geofm Glade PARAS, MD as PCP - General (Internal Medicine) O'Neal, Darryle Ned, MD as PCP - Cardiology (Cardiology)  HISTORY OF PRESENT ILLNESS: 49 y.o. female with a past medical history listed below  presents for evaluation of constipation.   Patient last seen in the office 10/14/2023 by Dr. Federico for constipation and abdominal pain.    Discussed the use of AI scribe software for clinical note transcription with the patient, who gave verbal consent to proceed.  History of Present Illness   Denise [name] is a 49 year old female with chronic constipation and prior colon polyps who presents for follow-up of constipation and abdominal pain.  Longstanding constipation and abdominal pain have been evaluated by multiple specialists. Regular colonoscopies since age 76 for IBS and prior polyps revealed small polyps at most procedures, prompting five-year surveillance intervals. The most recent colonoscopy in June 2024 showed only hemorrhoids and no polyps, marking the first procedure without polyps. Anxiety persists regarding extending surveillance to ten years, given her history and her father's history of colon polyps. In 2021, a single 5 mm sessile serrated polyp was found. Uncertainty remains about whether all prior pathology records were obtained from her previous gastroenterologist.  Constipation remains ongoing. Amitiza  24 mcg twice daily was prescribed but not started due to concerns about interactions with Vyvanse , Strattera, trazodone , and antihypertensives. Trulance  previously facilitated bowel movements but did not provide complete evacuation and was discontinued due to insurance issues. Linzess was previously used but found to be too strong. Trazodone  is used as needed at night, and she prefers to avoid unnecessary medications due to concerns about interactions.  Abdominal pain is localized to the left upper quadrant, sometimes described as a twisting sensation, particularly at night. Difficulty relaxing abdominal muscles is noted, with a persistent inability to let her stomach go. Pain occurs sporadically every other day, with intermittent twisting and associated nausea. No association with food  intake. Adhesive patches cannot be used due to skin reactions. Acid reflux is present and  perceived to exacerbate symptoms.  History of pelvic floor dysfunction related to childbirth. Pelvic floor issues are related to childbirth, and she has previously attended pelvic floor physical therapy with mixed results. An upcoming appointment for pelvic floor therapy is scheduled in February.  Additional history includes hypertension, ADHD, and insomnia, managed with multiple medications.        She  reports that she has never smoked. She has never used smokeless tobacco. She reports current alcohol use. She reports that she does not use drugs.  RELEVANT GI HISTORY, IMAGING AND LABS: Results   Radiology CT abdomen and pelvis (2024): Normal  Diagnostic Colonoscopy (10/2022): Hemorrhoids present; otherwise normal. No polyps identified. Colonoscopy (2021): Single 5 mm sessile serrated polyp.  Pathology Pathology of colon polyp (2021): Sessile serrated polyp, 5 mm, single.     CTA C/A/P w/contrast 03/16/22: IMPRESSION: 1. No acute intrathoracic pathology. No aortic aneurysm or dissection. 2. Findings suspicious for right pyelonephritis. Correlation with urinalysis recommended. No drainable fluid collection or abscess.   CT A/P w/contrast 02/21/23: IMPRESSION: No acute findings or other significant abnormality   EGD 10/29/22: - Normal esophagus. Biopsied.  - Gastritis. Biopsied.  - Normal examined duodenum. Path: 1. 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 H pylori was negative NEGATIVE FOR INTESTINAL METAPLASIA OR DYSPLASIA. 2. Surgical [P], esophagus ESOPHAGEAL SQUAMOUS MUCOSA WITH NO SIGNIFICANT DIAGNOSTIC ALTERATION. NO EVIDENCE OF INTRAEPITHELIAL EOSINOPHILS OR LYMPHOCYTES.   Colonoscopy 10/29/22: - Non- bleeding internal hemorrhoids.  - The examination was otherwise normal.  - No specimens collected.  Patient in the hospital CBC     Component Value Date/Time   WBC 4.5 02/06/2023 1103   RBC 5.02 02/06/2023 1103   HGB 14.3 02/06/2023 1103   HGB 14.7 03/14/2022 1559   HGB 14.6 02/01/2016 1633   HCT 42.0 02/06/2023 1103   HCT 43.5 03/14/2022 1559   PLT 263.0 02/06/2023 1103   PLT 291 03/14/2022 1559   MCV 83.6 02/06/2023 1103   MCV 87 03/14/2022 1559   MCH 29.5 03/16/2022 1812   MCHC 34.1 02/06/2023 1103   RDW 13.8 02/06/2023 1103   RDW 13.0 03/14/2022 1559   LYMPHSABS 1.9 02/06/2023 1103   LYMPHSABS 1.5 03/14/2022 1559   MONOABS 0.5 02/06/2023 1103   EOSABS 0.0 02/06/2023 1103   EOSABS 0.0 03/14/2022 1559   BASOSABS 0.0 02/06/2023 1103   BASOSABS 0.0 03/14/2022 1559   No results for input(s): HGB in the last 8760 hours.  CMP     Component Value Date/Time   NA 139 02/06/2024 1108   NA 141 03/14/2022 1559   K 3.2 (L) 02/06/2024 1108   CL 99 02/06/2024 1108   CO2 30 02/06/2024 1108   GLUCOSE 84 02/06/2024 1108   BUN 13 02/06/2024 1108   BUN 14 03/14/2022 1559   CREATININE 0.85 02/06/2024 1108   CREATININE 0.92 10/14/2012 1435   CALCIUM 10.1 02/06/2024 1108   PROT 7.2 02/06/2024 1108   PROT 7.2 03/14/2022 1559   ALBUMIN 4.6 02/06/2024 1108   ALBUMIN 4.9 03/14/2022 1559   AST 20 02/06/2024 1108   ALT 18 02/06/2024 1108   ALKPHOS 72 02/06/2024 1108   BILITOT 1.5 (H) 02/06/2024 1108   BILITOT 1.4 (H) 03/14/2022 1559   GFRNONAA >60 03/16/2022 1812   GFRAA 90 04/10/2018 1447      Latest Ref Rng & Units 02/06/2024   11:08 AM 08/06/2023   11:31 AM 02/15/2023    4:27 PM  Hepatic Function  Total  Protein 6.0 - 8.3 g/dL 7.2  7.6    Albumin 3.5 - 5.2 g/dL 4.6  4.8    AST 0 - 37 U/L 20  29    ALT 0 - 35 U/L 18  23    Alk Phosphatase 39 - 117 U/L 72  80    Total Bilirubin 0.2 - 1.2 mg/dL 1.5  1.6  1.3   Bilirubin, Direct 0.0 - 0.3 mg/dL   0.2       Current Medications:   Current Outpatient Medications (Endocrine & Metabolic):    estradiol  (ESTRACE ) 0.5 MG tablet, TAKE 1 TABLET BY MOUTH 2 TIMES  A DAY   norethindrone  (AYGESTIN ) 5 MG tablet, Take 1 tablet daily on days 1-15 each month  Current Outpatient Medications (Cardiovascular):    indapamide  (LOZOL ) 1.25 MG tablet, TAKE 1 TABLET BY MOUTH DAILY   NIFEdipine  (PROCARDIA  XL/NIFEDICAL XL) 60 MG 24 hr tablet, TAKE 1 TABLET BY MOUTH DAILY  Current Outpatient Medications (Respiratory):    azelastine  (ASTELIN ) 0.1 % nasal spray, Place 1-2 sprays into both nostrils 2 (two) times daily as needed (nasal drainage). Use in each nostril as directed   cetirizine  (ZYRTEC  ALLERGY ) 10 MG tablet, May take 10mg  1-2 timer per day for hives and allergies.   fluticasone  (FLONASE ) 50 MCG/ACT nasal spray, Place 1-2 sprays into both nostrils daily as needed (nasal congestion).  Current Outpatient Medications (Other):    atomoxetine (STRATTERA) 10 MG capsule,    clotrimazole  (LOTRIMIN ) 1 % cream, Apply 1 Application topically 2 (two) times daily.   cromolyn  (OPTICROM ) 4 % ophthalmic solution, Place 1 drop into both eyes 4 (four) times daily as needed (itchy/watery eyes).   estradiol  (ESTRACE ) 0.01 % CREA vaginal cream, 1 gram vaginally twice weekly   famotidine  (PEPCID ) 20 MG tablet, Take 1 tablet (20 mg total) by mouth 2 (two) times daily as needed for heartburn or indigestion.   hydrocortisone  2.5 % cream, Apply topically 2 (two) times daily.   hydrocortisone -pramoxine (PROCTOFOAM-HC) rectal foam, Place 1 applicator rectally 2 (two) times daily.   hyoscyamine  (LEVSIN  SL) 0.125 MG SL tablet, Place 1 tablet (0.125 mg total) under the tongue every 6 (six) hours as needed for cramping (nausea, diarrhea).   lisdexamfetamine  (VYVANSE ) 30 MG capsule, Take 1 capsule (30 mg total) by mouth daily.   lubiprostone  (AMITIZA ) 24 MCG capsule, Take 1 capsule (24 mcg total) by mouth 2 (two) times daily with a meal.   omeprazole  (PRILOSEC) 40 MG capsule, Take 1 capsule (40 mg total) by mouth daily.   potassium chloride  SA (KLOR-CON  M) 20 MEQ tablet, Take 1 tablet (20 mEq  total) by mouth daily.   Prenatal Vit-Fe Fumarate-FA (PRENATAL MULTIVITAMIN) TABS tablet, Take 1 tablet by mouth daily at 12 noon.   tiZANidine  (ZANAFLEX ) 4 MG tablet, Take 1 tablet (4 mg total) by mouth at bedtime.   traZODone  (DESYREL ) 50 MG tablet,    TRULANCE  3 MG TABS, TAKE 1 TABLET BY MOUTH DAILY   valACYclovir  (VALTREX ) 1000 MG tablet, Take 1 tablet (1,000 mg total) by mouth daily.  Medical History:  Past Medical History:  Diagnosis Date   Allergy     Chronic headaches    Colon polyps    Complication of anesthesia    crying excessive spitting   Concussion age 51 or 4 no residual   Family history of adverse reaction to anesthesia    ponv   GERD (gastroesophageal reflux disease)    Gilbert's syndrome    Heart murmur 06/26/2013  HSV-2 infection    2008   Hyperlipidemia 2021   Hypertension    IBS (irritable bowel syndrome)    IC (interstitial cystitis)    2008   Migraine    Urticaria    Wears glasses    Allergies: Allergies[1]   Surgical History:  She  has a past surgical history that includes Shoulder arthroscopy (Right, 2010); Knee arthroscopy (3/11, 2012); Wisdom tooth extraction (2008); Cesarean section (N/A, 08/30/2013); Adenoidectomy (as child); Tonsillectomy (as child); Breast surgery (2002); colonscopy (04/18/2020); and Dilation and evacuation (N/A, 06/23/2020). Family History:  Her family history includes Anemia in her maternal aunt and mother; Asthma in her son; Cancer in her maternal aunt, maternal grandfather, and maternal uncle; Colon polyps in her father; Diabetes in her maternal grandmother, mother, paternal aunt, and paternal grandmother; Hearing loss in her sister; Heart disease in her brother, father, paternal aunt, and paternal grandfather; Heart failure in her maternal grandmother; Hyperlipidemia in her father, mother, and sister; Hypertension in her father, sister, and sister; Irritable bowel syndrome in her mother; Kidney disease in her father, maternal  uncle, and sister; Miscarriages / Stillbirths in her sister; Prostate cancer in her maternal grandfather and paternal uncle; Renal Disease in her paternal grandfather; Stroke in her paternal grandfather, paternal grandmother, and sister; Thyroid  disease in her sister.  REVIEW OF SYSTEMS  : All other systems reviewed and negative except where noted in the History of Present Illness.  PHYSICAL EXAM: BP 116/66   Pulse 75   Ht 5' 3 (1.6 m)   Wt 140 lb (63.5 kg)   BMI 24.80 kg/m  Physical Exam   GENERAL APPEARANCE: Well nourished, in no apparent distress. HEENT: No cervical lymphadenopathy, unremarkable thyroid , sclerae anicteric, conjunctiva pink. RESPIRATORY: Respiratory effort normal, breath sounds equal bilaterally without rales, rhonchi, or wheezing. CARDIO: Regular rate and rhythm with no murmurs, rubs, or gallops, peripheral pulses intact. ABDOMEN: Soft, non-distended, active bowel sounds in all four quadrants. Tenderness to palpation noted, particularly in the left upper quadrant. Diastasis recti one finger width. 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 Adhesive allergy  noted. Heating pad and Voltaren gel recommended. CT scan and endoscopy normal. RECTAL: Declines. MUSCULOSKELETAL: Full range of motion, normal gait, without edema. SKIN: Dry, intact without rashes or lesions. No jaundice. NEURO: Alert, oriented, no focal deficits. PSYCH: Cooperative, normal mood and affect.      Alan JONELLE Coombs, PA-C 11:46 AM      [1]  Allergies Allergen Reactions   Ace Inhibitors     Swelling og tongue and lips   Adhesive [Tape] Rash   Augmentin  [Amoxicillin -Pot Clavulanate] Rash    Rash on lower legs   Latex Itching and Rash    Rash and itching from use of gloves   "

## 2024-06-02 ENCOUNTER — Encounter: Payer: Self-pay | Admitting: Physician Assistant

## 2024-06-02 ENCOUNTER — Other Ambulatory Visit: Payer: Self-pay | Admitting: Physician Assistant

## 2024-06-02 ENCOUNTER — Ambulatory Visit: Admitting: Physician Assistant

## 2024-06-02 VITALS — BP 116/66 | HR 75 | Ht 63.0 in | Wt 140.0 lb

## 2024-06-02 DIAGNOSIS — Z860100 Personal history of colon polyps, unspecified: Secondary | ICD-10-CM

## 2024-06-02 DIAGNOSIS — R1013 Epigastric pain: Secondary | ICD-10-CM

## 2024-06-02 DIAGNOSIS — R1032 Left lower quadrant pain: Secondary | ICD-10-CM

## 2024-06-02 DIAGNOSIS — K219 Gastro-esophageal reflux disease without esophagitis: Secondary | ICD-10-CM

## 2024-06-02 DIAGNOSIS — K2970 Gastritis, unspecified, without bleeding: Secondary | ICD-10-CM

## 2024-06-02 DIAGNOSIS — K649 Unspecified hemorrhoids: Secondary | ICD-10-CM

## 2024-06-02 DIAGNOSIS — K581 Irritable bowel syndrome with constipation: Secondary | ICD-10-CM

## 2024-06-02 DIAGNOSIS — K5900 Constipation, unspecified: Secondary | ICD-10-CM

## 2024-06-02 MED ORDER — HYOSCYAMINE SULFATE 0.125 MG SL SUBL: 0.1250 mg | SUBLINGUAL_TABLET | Freq: Four times a day (QID) | SUBLINGUAL | 1 refills | Status: DC | PRN

## 2024-06-02 NOTE — Patient Instructions (Addendum)
 Please do the following: Purchase a bottle of Miralax over the counter as well as a box of 5 mg dulcolax tablets. Take 4 dulcolax tablets. Wait 1 hour. You will then drink 6-8 capfuls of Miralax mixed in an adequate amount of water/juice/gatorade (you may choose which of these liquids to drink) over the next 2-3 hours. You should expect results within 1 to 6 hours after completing the bowel purge. Go to the er if you have severe AB pain, can not pass gas or stool in over 12 hours, can not hold down any food.   Voltern gel over the counter at night epigastric with heating pad for 15 mins nightly.  Toileting tips to help with your constipation - Drink at least 64-80 ounces of water/liquid per day. - Establish a time to try to move your bowels every day.  For many people, this is after a cup of coffee or after a meal such as breakfast. - Sit all of the way back on the toilet keeping your back fairly straight and while sitting up, try to rest the tops of your forearms on your upper thighs.   - Raising your feet with a step stool/squatty potty can be helpful to improve the angle that allows your stool to pass through the rectum. - Relax the rectum feeling it bulge toward the toilet water.  If you feel your rectum raising toward your body, you are contracting rather than relaxing. - Breathe in and slowly exhale. Belly breath by expanding your belly towards your belly button. Keep belly expanded as you gently direct pressure down and back to the anus.  A low pitched GRRR sound can assist with increasing intra-abdominal pressure.  (Can also trying to blow on a pinwheel and make it move, this helps with the same belly breathing) - Repeat 3-4 times. If unsuccessful, contract the pelvic floor to restore normal tone and get off the toilet.  Avoid excessive straining. - To reduce excessive wiping by teaching your anus to normally contract, place hands on outer aspect of knees and resist knee movement  outward.  Hold 5-10 second then place hands just inside of knees and resist inward movement of knees.  Hold 5 seconds.  Repeat a few times each way.  Go to the ER if unable to pass gas, severe AB pain, unable to hold down food, any shortness of breath of chest pain.  First do a trial off milk/lactose products if you use them.  Add fiber like benefiber or citracel once a day Increase activity Can do trial of IBGard which is over the counter for AB pain- Take 1-2 capsules once a day for maintence or twice a day during a flare Can send in an anti spasm medication, Levsin , to take as needed Please try to decrease stress. consider talking with PCP about anti anxiety medication or try head space app for meditation. if any worsening symptoms like blood in stool, weight loss, please call the office     FODMAP stands for fermentable oligo-, di-, mono-saccharides and polyols (1). These are the scientific terms used to classify groups of carbs that are difficult for our body to digest and that are notorious for triggering digestive symptoms like bloating, gas, loose stools and stomach pain.   You can try low FODMAP diet  - start with eliminating just one column at a time that you feel may be a trigger for you. - the table at the very bottom contains foods that are low in FODMAPs  Sometimes trying to eliminate the FODMAP's from your diet is difficult or tricky, if you are stuggling with trying to do the elimination diet you can try an enzyme.  There is a food enzymes that you sprinkle in or on your food that helps break down the FODMAP. You can read more about the enzyme by going to this site: https://fodzyme.com/   I appreciate the  opportunity to care for you  Thank You   [facility]

## 2024-06-05 ENCOUNTER — Other Ambulatory Visit (HOSPITAL_BASED_OUTPATIENT_CLINIC_OR_DEPARTMENT_OTHER): Payer: Self-pay

## 2024-06-05 DIAGNOSIS — N912 Amenorrhea, unspecified: Secondary | ICD-10-CM

## 2024-06-10 ENCOUNTER — Other Ambulatory Visit (HOSPITAL_BASED_OUTPATIENT_CLINIC_OR_DEPARTMENT_OTHER)

## 2024-06-10 ENCOUNTER — Ambulatory Visit (HOSPITAL_BASED_OUTPATIENT_CLINIC_OR_DEPARTMENT_OTHER): Payer: Self-pay | Admitting: Obstetrics & Gynecology

## 2024-06-15 ENCOUNTER — Ambulatory Visit (HOSPITAL_BASED_OUTPATIENT_CLINIC_OR_DEPARTMENT_OTHER)

## 2024-06-15 ENCOUNTER — Encounter (HOSPITAL_BASED_OUTPATIENT_CLINIC_OR_DEPARTMENT_OTHER): Payer: Self-pay | Admitting: Obstetrics & Gynecology

## 2024-06-15 ENCOUNTER — Ambulatory Visit (HOSPITAL_BASED_OUTPATIENT_CLINIC_OR_DEPARTMENT_OTHER): Admitting: Obstetrics & Gynecology

## 2024-06-15 ENCOUNTER — Other Ambulatory Visit

## 2024-06-15 VITALS — BP 125/77 | HR 98 | Wt 141.2 lb

## 2024-06-15 DIAGNOSIS — N950 Postmenopausal bleeding: Secondary | ICD-10-CM

## 2024-06-15 DIAGNOSIS — N926 Irregular menstruation, unspecified: Secondary | ICD-10-CM

## 2024-06-15 DIAGNOSIS — Z79890 Hormone replacement therapy (postmenopausal): Secondary | ICD-10-CM

## 2024-06-15 DIAGNOSIS — N9489 Other specified conditions associated with female genital organs and menstrual cycle: Secondary | ICD-10-CM

## 2024-06-15 DIAGNOSIS — N912 Amenorrhea, unspecified: Secondary | ICD-10-CM

## 2024-06-15 MED ORDER — NORETHINDRONE ACETATE 5 MG PO TABS: 5.0000 mg | ORAL_TABLET | Freq: Every day | ORAL | 1 refills | Status: AC

## 2024-06-15 NOTE — Progress Notes (Signed)
" ° °  Ultrasound f/u Patient name: Denise [name] MRN [id number]  Date of birth: [birth year] Chief Complaint:   Follow-up  History of Present Illness:   Denise [name] is a 49 y.o. [id number] African-American female being seen today for discussion of ultrasound findings.  H/o menopause in her 22's.  On HRT with estradiol  0.5mg  and norethindrone  5mg  days 1-15.  Ultrasound done due to irregular bleeding.  Has used 200mg  Prometrium  dosage but this made her feel too tired.  Has used 100mg  dosage as well but had irregular bleeding, spotting with this medication.  Ultrasound ordered.  Uterus is small.  Endometrium is 2.63mm.  Ovaries small as well.  No abnormal findings noted.  She is now using norethdrone days 1-15 each month.  Having a lot of severe cramping before onset of withdrawal bleeding.  She has used vivelle  dot as well but had reaction to adhesive so can only use oral.  Given all of the above difficulties with HRT and after discussing options, decided to try norethindrone  5mg  days.  Rx will need to be updated.  Pt aware if has any irregular bleeidng, she is to call.  Will proceed with endometrial biopsy at that time.    Patient's last menstrual period was 06/14/2024 (exact date).  Last pap 03/07/2024. Results were: WNL.  Negative HR HPV 2022.  . \ Review of Systems:   Pertinent items are noted in HPI Denies any urinary or bowel changes or pelvic pain Pertinent History Reviewed:  Reviewed past medical,surgical, social and family history.  Reviewed problem list, medications and allergies. Physical Assessment:   Vitals:   06/15/24 1257  BP: 125/77  Pulse: 98  SpO2: 100%  Weight: 141 lb 3.2 oz (64 kg)  Body mass index is 25.01 kg/m.        Physical Examination:   General appearance - well appearing, and in no distress  Mental status - alert, oriented to person, place, and time  Psych:  She has a normal mood and affect   Assessment & Plan:  1. Hormone replacement therapy (HRT) - will switch to  norethindrone  5mg  daily dosing - norethindrone  (AYGESTIN ) 5 MG tablet; Take 1 tablet (5 mg total) by mouth daily.  Dispense: 90 tablet; Refill: 1  2. Uterine cramping (Primary)  3. Irregular bleeding - endometrium 2.50mm today.  If irregular spotting continues, will proceed with endometrial biopsy.  Pt comfortable with plan.     Meds:  Meds ordered this encounter  Medications   norethindrone  (AYGESTIN ) 5 MG tablet    Sig: Take 1 tablet (5 mg total) by mouth daily.    Dispense:  90 tablet    Refill:  1    Follow-up: No follow-ups on file.  Ronal GORMAN Pinal, MD 06/15/2024 11:13 PM GYNECOLOGY  VISIT    "

## 2024-07-09 ENCOUNTER — Ambulatory Visit: Admitting: Physical Therapy

## 2024-08-12 ENCOUNTER — Encounter: Admitting: Internal Medicine

## 2024-10-05 ENCOUNTER — Ambulatory Visit: Payer: Self-pay | Admitting: Allergy
